# Patient Record
Sex: Female | Born: 1990 | Race: White | Hispanic: No | Marital: Married | Smoking: Never smoker
Health system: Southern US, Community
[De-identification: ages and names within clinical notes are randomized; demographics above are authoritative.]

## PROBLEM LIST (undated history)

## (undated) ENCOUNTER — Emergency Department (HOSPITAL_COMMUNITY): Discharge status: Absent without leave | Payer: Medicaid Other

## (undated) ENCOUNTER — Inpatient Hospital Stay (HOSPITAL_COMMUNITY): Payer: Self-pay

## (undated) DIAGNOSIS — E119 Type 2 diabetes mellitus without complications: Secondary | ICD-10-CM

## (undated) DIAGNOSIS — K5900 Constipation, unspecified: Secondary | ICD-10-CM

## (undated) DIAGNOSIS — F99 Mental disorder, not otherwise specified: Secondary | ICD-10-CM

## (undated) DIAGNOSIS — S73004A Unspecified dislocation of right hip, initial encounter: Secondary | ICD-10-CM

## (undated) DIAGNOSIS — I10 Essential (primary) hypertension: Secondary | ICD-10-CM

## (undated) DIAGNOSIS — Z9889 Other specified postprocedural states: Secondary | ICD-10-CM

## (undated) HISTORY — DX: Constipation, unspecified: K59.00

## (undated) HISTORY — DX: Morbid (severe) obesity due to excess calories: E66.01

## (undated) HISTORY — DX: Other specified postprocedural states: Z98.890

## (undated) HISTORY — DX: Mental disorder, not otherwise specified: F99

## (undated) HISTORY — PX: TUBAL LIGATION: SHX77

---
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
## 2011-08-09 NOTE — L&D Delivery Note (Addendum)
Delivery Note At 5:57 AM a viable female was delivered via Vaginal, Spontaneous Delivery (Presentation: ROA ).  APGAR: 8, 9; weight 7 lb 14.1 oz (3575 g).   Placenta status: Intact, Spontaneous, 2 large retroplacental clots.  Cord: 3 vessels with the following complications: None.   IV came out while pushing.  Received 10units pitocin IM after placenta, then had steady trickle of vaginal bleeding- received cytotec pr with good resolution of hemorrhage and fundus firm and u/1.  Anesthesia: Local  Episiotomy: None Lacerations: Labial;2nd degree;Perineal Suture Repair: 3.0 monocryl for vaginal/perineal, and 4.0 monocryl for Lt labial Est. Blood Loss (mL): 500  Dr. Jolayne Panther in to assist w/ repair  Mom to postpartum.  Baby to nursery for evaluation of possible respiratory distress, sats were good post-birth, but was grunting some.  Wants to bottlefeed, desires COCs- reinforced importance of taking daily, states sister-in-law will help her remember  Very hard IV stick, anesthesia had to start 20g in Lt anterior wrist on admit- will leave nsl out for now.  If requires fluids, additional iv meds call anesthesia to restart.  [name] 04/15/2012, 7:23 AM

## 2011-12-22 LAB — OB RESULTS CONSOLE GC/CHLAMYDIA
Chlamydia: NEGATIVE
Gonorrhea: NEGATIVE

## 2011-12-22 LAB — OB RESULTS CONSOLE ANTIBODY SCREEN: Antibody Screen: NEGATIVE

## 2011-12-22 LAB — OB RESULTS CONSOLE RPR: RPR: NONREACTIVE

## 2011-12-22 LAB — OB RESULTS CONSOLE ABO/RH: RH Type: POSITIVE

## 2011-12-22 LAB — OB RESULTS CONSOLE HIV ANTIBODY (ROUTINE TESTING): HIV: NONREACTIVE

## 2011-12-22 LAB — OB RESULTS CONSOLE RUBELLA ANTIBODY, IGM: Rubella: UNDETERMINED

## 2011-12-22 LAB — OB RESULTS CONSOLE HEPATITIS B SURFACE ANTIGEN: Hepatitis B Surface Ag: NEGATIVE

## 2012-03-25 LAB — OB RESULTS CONSOLE GBS: GBS: POSITIVE

## 2012-04-13 ENCOUNTER — Encounter (HOSPITAL_COMMUNITY): Payer: Self-pay | Admitting: *Deleted

## 2012-04-13 ENCOUNTER — Telehealth (HOSPITAL_COMMUNITY): Payer: Self-pay | Admitting: *Deleted

## 2012-04-13 NOTE — Telephone Encounter (Signed)
Preadmission screen  

## 2012-04-14 ENCOUNTER — Inpatient Hospital Stay (HOSPITAL_COMMUNITY)
Admission: RE | Admit: 2012-04-14 | Discharge: 2012-04-17 | DRG: 775 | Disposition: A | Discharge status: Home / Self Care | Payer: Medicaid Other | Source: Ambulatory Visit | Attending: Obstetrics and Gynecology | Admitting: Obstetrics and Gynecology

## 2012-04-14 DIAGNOSIS — O99892 Other specified diseases and conditions complicating childbirth: Secondary | ICD-10-CM | POA: Diagnosis present

## 2012-04-14 DIAGNOSIS — O480 Post-term pregnancy: Principal | ICD-10-CM | POA: Diagnosis present

## 2012-04-14 DIAGNOSIS — E669 Obesity, unspecified: Secondary | ICD-10-CM | POA: Diagnosis present

## 2012-04-14 DIAGNOSIS — Z22330 Carrier of Group B streptococcus: Secondary | ICD-10-CM

## 2012-04-14 LAB — TYPE AND SCREEN
ABO/RH(D): O POS
Antibody Screen: NEGATIVE

## 2012-04-14 LAB — CBC
HCT: 36.9 % (ref 36.0–46.0)
Hemoglobin: 12.1 g/dL (ref 12.0–15.0)
MCH: 27.4 pg (ref 26.0–34.0)
MCHC: 32.8 g/dL (ref 30.0–36.0)
MCV: 83.7 fL (ref 78.0–100.0)
Platelets: 287 10*3/uL (ref 150–400)
RBC: 4.41 MIL/uL (ref 3.87–5.11)
RDW: 15.8 % — ABNORMAL HIGH (ref 11.5–15.5)
WBC: 11.2 10*3/uL — ABNORMAL HIGH (ref 4.0–10.5)

## 2012-04-14 LAB — ABO/RH: ABO/RH(D): O POS

## 2012-04-14 LAB — RPR: RPR Ser Ql: NONREACTIVE

## 2012-04-14 MED ORDER — NALBUPHINE SYRINGE 5 MG/0.5 ML
5.0000 mg | INJECTION | INTRAMUSCULAR | Status: DC | PRN
  Administered 2012-04-14 – 2012-04-15 (×3): 5 mg via INTRAVENOUS
  Filled 2012-04-14 (×3): qty 0.5

## 2012-04-14 MED ORDER — CITRIC ACID-SODIUM CITRATE 334-500 MG/5ML PO SOLN: 30.0000 mL | ORAL | Status: DC | PRN

## 2012-04-14 MED ORDER — OXYTOCIN BOLUS FROM INFUSION
500.0000 mL | Freq: Once | INTRAVENOUS | Status: DC
  Filled 2012-04-14: qty 500

## 2012-04-14 MED ORDER — LACTATED RINGERS IV SOLN
INTRAVENOUS | Status: DC
  Administered 2012-04-14 (×2): via INTRAVENOUS

## 2012-04-14 MED ORDER — HYDROXYZINE HCL 50 MG/ML IM SOLN: 50.0000 mg | Freq: Four times a day (QID) | INTRAMUSCULAR | Status: DC | PRN

## 2012-04-14 MED ORDER — PENICILLIN G POTASSIUM 5000000 UNITS IJ SOLR
2.5000 10*6.[IU] | INTRAVENOUS | Status: DC
  Administered 2012-04-14 – 2012-04-15 (×4): 2.5 10*6.[IU] via INTRAVENOUS
  Filled 2012-04-14 (×7): qty 2.5

## 2012-04-14 MED ORDER — LIDOCAINE HCL (PF) 1 % IJ SOLN
30.0000 mL | INTRAMUSCULAR | Status: DC | PRN
  Administered 2012-04-15: 30 mL via SUBCUTANEOUS
  Filled 2012-04-14: qty 30

## 2012-04-14 MED ORDER — OXYTOCIN 40 UNITS IN LACTATED RINGERS INFUSION - SIMPLE MED
1.0000 m[IU]/min | INTRAVENOUS | Status: DC
  Administered 2012-04-14: 2 m[IU]/min via INTRAVENOUS
  Filled 2012-04-14: qty 1000

## 2012-04-14 MED ORDER — OXYTOCIN 40 UNITS IN LACTATED RINGERS INFUSION - SIMPLE MED: 62.5000 mL/h | Freq: Once | INTRAVENOUS | Status: DC

## 2012-04-14 MED ORDER — HYDROXYZINE HCL 50 MG PO TABS: 50.0000 mg | ORAL_TABLET | Freq: Four times a day (QID) | ORAL | Status: DC | PRN

## 2012-04-14 MED ORDER — PENICILLIN G POTASSIUM 5000000 UNITS IJ SOLR
5.0000 10*6.[IU] | Freq: Once | INTRAVENOUS | Status: AC
  Administered 2012-04-14: 5 10*6.[IU] via INTRAVENOUS
  Filled 2012-04-14: qty 5

## 2012-04-14 MED ORDER — ACETAMINOPHEN 325 MG PO TABS: 650.0000 mg | ORAL_TABLET | ORAL | Status: DC | PRN

## 2012-04-14 MED ORDER — ONDANSETRON HCL 4 MG/2ML IJ SOLN
4.0000 mg | Freq: Four times a day (QID) | INTRAMUSCULAR | Status: DC | PRN
  Administered 2012-04-15: 4 mg via INTRAVENOUS
  Filled 2012-04-14: qty 2

## 2012-04-14 MED ORDER — TERBUTALINE SULFATE 1 MG/ML IJ SOLN: 0.2500 mg | Freq: Once | INTRAMUSCULAR | Status: AC | PRN

## 2012-04-14 MED ORDER — LACTATED RINGERS IV SOLN
500.0000 mL | INTRAVENOUS | Status: DC | PRN
  Administered 2012-04-15: 500 mL via INTRAVENOUS

## 2012-04-14 MED ORDER — OXYCODONE-ACETAMINOPHEN 5-325 MG PO TABS: 1.0000 | ORAL_TABLET | ORAL | Status: DC | PRN

## 2012-04-14 MED ORDER — IBUPROFEN 600 MG PO TABS: 600.0000 mg | ORAL_TABLET | Freq: Four times a day (QID) | ORAL | Status: DC | PRN

## 2012-04-14 MED ORDER — FLEET ENEMA 7-19 GM/118ML RE ENEM: 1.0000 | ENEMA | RECTAL | Status: DC | PRN

## 2012-04-14 NOTE — Progress Notes (Signed)
Holly Pope is a 21 y.o. G1P0 at [redacted]w[redacted]d admitted for induction of labor due to Post dates. Due date 8/30.  Subjective: Doing well, feeling mild cramping occasionally. +fm.  Objective: BP 119/91  Pulse 133  Temp 98 F (36.7 C) (Oral)  Resp 18  Ht 5\' 2"  (1.575 m)  Wt 137.893 kg (304 lb)  BMI 55.60 kg/m2  LMP 07/01/2011      FHT:  FHR: 135 bpm, variability: moderate,  accelerations:  Present,  decelerations:  Absent RN applying beacon monitor UC:   Occasional, mild SVE:   Dilation: 1 Effacement (%): 50 Station: -2 Exam by:: kbooker, cnm  Cervical foley bulb placed and inflated w/ 60ml water w/o difficulty  Labs: Lab Results  Component Value Date   WBC 11.2* 04/14/2012   HGB 12.1 04/14/2012   HCT 36.9 04/14/2012   MCV 83.7 04/14/2012   PLT 287 04/14/2012    Assessment / Plan: IOL d/t postdates  Labor: Cervical foley bulb placed, will begin low dose pitocin not to exceed 12mu/min while foley bulb in Preeclampsia:  n/a Fetal Wellbeing:  Category I Pain Control:  n/a I/D:  n/a Anticipated MOD:  NSVD  [name] 04/14/2012, 10:46 AM

## 2012-04-14 NOTE — Progress Notes (Signed)
Requested CRNA to place IV.

## 2012-04-14 NOTE — Progress Notes (Signed)
Holly Pope is a 21 y.o. G1P0 at [redacted]w[redacted]d admitted for induction of labor due to Post dates.   Subjective: Comfortable, only feels mild uc's  Objective: BP 106/61  Pulse 84  Temp 98 F (36.7 C) (Oral)  Resp 18  Ht 5\' 2"  (1.575 m)  Wt 137.893 kg (304 lb)  BMI 55.60 kg/m2  LMP 07/01/2011      FHT:  FHR: 135 bpm, variability: moderate,  accelerations:  Present,  decelerations:  Absent UC:   regular, every 2-3 minutes SVE:   Dilation: 4 Effacement (%): 70 Station: -2 Exam by:: Ferne Coe RN  Foley bulb fell out and srom clear fluid @ 1840  Labs: Lab Results  Component Value Date   WBC 11.2* 04/14/2012   HGB 12.1 04/14/2012   HCT 36.9 04/14/2012   MCV 83.7 04/14/2012   PLT 287 04/14/2012    Assessment / Plan: IOL d/t postdates, cervical foley bulb fell out w/ srom clear fluid @ 1840, on 29mu/min pitocin  Labor: not yet Preeclampsia:  n/a Fetal Wellbeing:  Category I Pain Control:  n/a I/D:  will begin pcn w/ active labor Anticipated MOD:  NSVD  [name] 04/14/2012, 7:13 PM

## 2012-04-14 NOTE — Progress Notes (Signed)
Foley bulb placed in cervix with 60cc water in balloon.

## 2012-04-14 NOTE — Progress Notes (Signed)
Holly Pope is a 21 y.o. G1P0 at 108w1d admitted for induction of labor due to Post dates.  Subjective: Beginning to feel uc's slightly  Objective: BP 115/63  Pulse 90  Temp 98 F (36.7 C) (Oral)  Resp 18  Ht 5\' 2"  (1.575 m)  Wt 137.893 kg (304 lb)  BMI 55.60 kg/m2  LMP 07/01/2011      FHT:  FHR: 130 bpm, variability: moderate,  accelerations:  Present,  decelerations:  Absent UC:   regular, every 2-3 minutes SVE:   Dilation: 1 Effacement (%): 50 Station: -2 Exam by:: [employer] CNM  Labs: Lab Results  Component Value Date   WBC 11.2* 04/14/2012   HGB 12.1 04/14/2012   HCT 36.9 04/14/2012   MCV 83.7 04/14/2012   PLT 287 04/14/2012    Assessment / Plan: IOL d/t postdates, cervical foley bulb in place, on max dose of 79mu/min pitocin  Labor: not yet Preeclampsia:  n/a Fetal Wellbeing:  Category I Pain Control:  n/a I/D:  will begin pcn when active labor Anticipated MOD:  NSVD  [name] 04/14/2012, 1:42 PM

## 2012-04-14 NOTE — Progress Notes (Addendum)
Holly Pope is a 21 y.o. G1P0 at [redacted]w[redacted]d admitted for induction of labor due to Post dates.  Subjective: Comfortable, feeling mild uc's  Objective: BP 115/59  Pulse 91  Temp 98 F (36.7 C) (Oral)  Resp 18  Ht 5\' 2"  (1.575 m)  Wt 137.893 kg (304 lb)  BMI 55.60 kg/m2  LMP 07/01/2011      FHT:  FHR: 130 bpm, variability: moderate,  accelerations:  Present,  decelerations:  Absent UC:   irregular, every 2-5 minutes SVE:   Dilation: 1 Effacement (%): 50 Station: -2 Exam by:: Holly Pope CNM  Labs: Lab Results  Component Value Date   WBC 11.2* 04/14/2012   HGB 12.1 04/14/2012   HCT 36.9 04/14/2012   MCV 83.7 04/14/2012   PLT 287 04/14/2012    Assessment / Plan: IOL d/t postdates, cervical foley bulb in place, on 65mu/min pitocin  Labor: not yet Preeclampsia:  n/a Fetal Wellbeing:  Category I Pain Control:  n/a I/D:  pcn when in active labor Anticipated MOD:  NSVD  Holly Pope 04/14/2012, 4:53 PM

## 2012-04-14 NOTE — Progress Notes (Signed)
Holly Pope is a 21 y.o. G1P0 at [redacted]w[redacted]d admitted for induction of labor due to Post dates.   Subjective: Some relief w/ nubain. Family supportive at bs.  Objective: BP 117/76  Pulse 82  Temp 98 F (36.7 C) (Oral)  Resp 22  Ht 5\' 2"  (1.575 m)  Wt 137.893 kg (304 lb)  BMI 55.60 kg/m2  LMP 07/01/2011      FHT:  FHR: 120 bpm, variability: moderate,  accelerations:  Present,  decelerations:  Absent UC:   regular, every 1-4 minutes SVE:   Dilation: 4 Effacement (%): 70 Station: -2 Exam by:: Ferne Coe RN  Labs: Lab Results  Component Value Date   WBC 11.2* 04/14/2012   HGB 12.1 04/14/2012   HCT 36.9 04/14/2012   MCV 83.7 04/14/2012   PLT 287 04/14/2012    Assessment / Plan: IOL d/t postdates, now on pitocin @ 29mu/min  Labor: not yet Preeclampsia:  n/a Fetal Wellbeing:  Category I Pain Control:  nubain I/D:  penicillin per protocol for gbs+ Anticipated MOD:  NSVD  [name] 04/14/2012, 9:57 PM

## 2012-04-14 NOTE — H&P (Signed)
Holly Pope is a 21 y.o. G1P0 female at [redacted]w[redacted]d  presenting for IOL for postdates.  Denies uc's, VB, or LOF.  Reports +fm.  Denies HA, scotoma, ruq/epigastric pain, n/v.  Was checked in office last week per pt and was 1cm.  Late onset of care @ 24.6wks at [facility].  Rubella equivocal.  GBS pos.   Maternal Medical History:  Fetal activity: Perceived fetal activity is normal.   Last perceived fetal movement was within the past hour.    Prenatal complications: Late onset of care @ 24.6  Prenatal Complications - Diabetes: none.    OB History    Grav Para Term Preterm Abortions TAB SAB Ect Mult Living   1              Past Medical History  Diagnosis Date  . H/O removal of neck cyst   . Morbidly obese    Past Surgical History  Procedure Date  . Cystectomy 2002    rmoval from neck   Family History: family history includes Heart disease in her father and mother and Hypertension in her brother, father, and mother. Social History:  does not have a smoking history on file. She has never used smokeless tobacco. She reports that she does not drink alcohol or use illicit drugs.   Prenatal Transfer Tool  Maternal Diabetes: No Genetic Screening: Declined Maternal Ultrasounds/Referrals: normal Fetal Ultrasounds or other Referrals:  None Maternal Substance Abuse:  No Significant Maternal Medications:  None Significant Maternal Lab Results:  Lab values include: Group B Strep positive Other Comments:  None  Review of Systems  Constitutional: Negative.   HENT: Negative.   Eyes: Negative.   Respiratory: Negative.   Cardiovascular: Negative.   Gastrointestinal: Negative.   Genitourinary: Negative.   Musculoskeletal: Negative.   Skin: Negative.   Neurological: Negative.   Endo/Heme/Allergies: Negative.   Psychiatric/Behavioral: Negative.     Dilation: 1 Effacement (%): 50 Station: -2 Exam by:: kbooker, cnm Blood pressure 119/91, pulse 133, temperature 98 F (36.7 C), temperature  source Oral, resp. rate 18, height 5\' 2"  (1.575 m), weight 137.893 kg (304 lb), last menstrual period 07/01/2011. Maternal Exam:  Uterine Assessment: Contraction frequency is rare.   Abdomen: Patient reports no abdominal tenderness. Fetal presentation: vertex  Introitus: Normal vulva. Normal vagina.  Pelvis: adequate for delivery.   Cervix: Cervix evaluated by digital exam.     Physical Exam  Constitutional: She is oriented to person, place, and time. She appears well-developed and well-nourished.  HENT:  Head: Normocephalic.  Cardiovascular: Normal rate and regular rhythm.   Respiratory: Effort normal and breath sounds normal.  GI: Soft.  Genitourinary: Vagina normal and uterus normal.  Musculoskeletal: Normal range of motion. She exhibits no edema.  Neurological: She is alert and oriented to person, place, and time. She has normal reflexes.  Skin: Skin is warm and dry.  Psychiatric: She has a normal mood and affect. Her behavior is normal. Judgment and thought content normal.    FHR: 125, mod, 15x15, no decels= Cat I UCs: occasional, mild  Prenatal labs: ABO, Rh: O/Positive/-- (05/16 0000) Antibody: Negative (05/16 0000) Rubella: Equivocal (05/16 0000) RPR: Nonreactive (05/16 0000)  HBsAg: Negative (05/16 0000)  HIV: Non-reactive (05/16 0000)  GBS: Positive (08/18 0000)   Assessment/Plan: A:  [redacted]w[redacted]d SIUP  IOL for postdates  Cat I FHR  GBS pos  Rubella equivocal  Morbid obesity  Late onset of care at 24.6  P:   Admit to BS  Cervical foley  bulb with low-dose pitocin not to exceed 2mu/min while foley bulb in  PCN per protocol when in active labor for gbs+  Anticipate NSVD  [name] 04/14/2012, 9:34 AM

## 2012-04-15 ENCOUNTER — Encounter (HOSPITAL_COMMUNITY): Payer: Self-pay

## 2012-04-15 DIAGNOSIS — E669 Obesity, unspecified: Secondary | ICD-10-CM

## 2012-04-15 DIAGNOSIS — O480 Post-term pregnancy: Secondary | ICD-10-CM

## 2012-04-15 DIAGNOSIS — O99214 Obesity complicating childbirth: Secondary | ICD-10-CM

## 2012-04-15 DIAGNOSIS — O9989 Other specified diseases and conditions complicating pregnancy, childbirth and the puerperium: Secondary | ICD-10-CM

## 2012-04-15 DIAGNOSIS — O99892 Other specified diseases and conditions complicating childbirth: Secondary | ICD-10-CM

## 2012-04-15 MED ORDER — MEASLES, MUMPS & RUBELLA VAC ~~LOC~~ INJ
0.5000 mL | INJECTION | Freq: Once | SUBCUTANEOUS | Status: AC
Start: 2012-04-16 — End: 2012-04-16
  Administered 2012-04-16: 0.5 mL via SUBCUTANEOUS
  Filled 2012-04-15 (×2): qty 0.5

## 2012-04-15 MED ORDER — ONDANSETRON HCL 4 MG/2ML IJ SOLN: 4.0000 mg | INTRAMUSCULAR | Status: DC | PRN

## 2012-04-15 MED ORDER — TETANUS-DIPHTH-ACELL PERTUSSIS 5-2.5-18.5 LF-MCG/0.5 IM SUSP
0.5000 mL | Freq: Once | INTRAMUSCULAR | Status: AC
  Administered 2012-04-16: 0.5 mL via INTRAMUSCULAR
  Filled 2012-04-15: qty 0.5

## 2012-04-15 MED ORDER — WITCH HAZEL-GLYCERIN EX PADS: 1.0000 "application " | MEDICATED_PAD | CUTANEOUS | Status: DC | PRN

## 2012-04-15 MED ORDER — IBUPROFEN 600 MG PO TABS
600.0000 mg | ORAL_TABLET | Freq: Four times a day (QID) | ORAL | Status: DC
  Administered 2012-04-15 – 2012-04-17 (×8): 600 mg via ORAL
  Filled 2012-04-15 (×8): qty 1

## 2012-04-15 MED ORDER — OXYCODONE-ACETAMINOPHEN 5-325 MG PO TABS
1.0000 | ORAL_TABLET | ORAL | Status: DC | PRN
  Administered 2012-04-15: 1 via ORAL
  Filled 2012-04-15: qty 1

## 2012-04-15 MED ORDER — DIPHENHYDRAMINE HCL 25 MG PO CAPS: 25.0000 mg | ORAL_CAPSULE | Freq: Four times a day (QID) | ORAL | Status: DC | PRN

## 2012-04-15 MED ORDER — OXYTOCIN 40 UNITS IN LACTATED RINGERS INFUSION - SIMPLE MED: 62.5000 mL/h | INTRAVENOUS | Status: DC | PRN

## 2012-04-15 MED ORDER — PRENATAL MULTIVITAMIN CH
1.0000 | ORAL_TABLET | Freq: Every day | ORAL | Status: DC
  Administered 2012-04-15 – 2012-04-16 (×2): 1 via ORAL
  Filled 2012-04-15 (×2): qty 1

## 2012-04-15 MED ORDER — SENNOSIDES-DOCUSATE SODIUM 8.6-50 MG PO TABS
2.0000 | ORAL_TABLET | Freq: Every day | ORAL | Status: DC
  Administered 2012-04-15 – 2012-04-16 (×2): 2 via ORAL

## 2012-04-15 MED ORDER — ZOLPIDEM TARTRATE 5 MG PO TABS: 5.0000 mg | ORAL_TABLET | Freq: Every evening | ORAL | Status: DC | PRN

## 2012-04-15 MED ORDER — FLEET ENEMA 7-19 GM/118ML RE ENEM: 1.0000 | ENEMA | Freq: Every day | RECTAL | Status: DC | PRN

## 2012-04-15 MED ORDER — SIMETHICONE 80 MG PO CHEW: 80.0000 mg | CHEWABLE_TABLET | ORAL | Status: DC | PRN

## 2012-04-15 MED ORDER — BISACODYL 10 MG RE SUPP: 10.0000 mg | Freq: Every day | RECTAL | Status: DC | PRN

## 2012-04-15 MED ORDER — DIBUCAINE 1 % RE OINT: 1.0000 "application " | TOPICAL_OINTMENT | RECTAL | Status: DC | PRN

## 2012-04-15 MED ORDER — OXYTOCIN 10 UNIT/ML IJ SOLN
INTRAMUSCULAR | Status: AC
  Administered 2012-04-15: 10 [IU]
  Filled 2012-04-15: qty 2

## 2012-04-15 MED ORDER — MISOPROSTOL 200 MCG PO TABS
ORAL_TABLET | ORAL | Status: AC
  Administered 2012-04-15: 800 ug via RECTAL
  Filled 2012-04-15: qty 4

## 2012-04-15 MED ORDER — BENZOCAINE-MENTHOL 20-0.5 % EX AERO
1.0000 "application " | INHALATION_SPRAY | CUTANEOUS | Status: DC | PRN
  Administered 2012-04-15: 1 via TOPICAL
  Filled 2012-04-15: qty 56

## 2012-04-15 MED ORDER — LANOLIN HYDROUS EX OINT: TOPICAL_OINTMENT | CUTANEOUS | Status: DC | PRN

## 2012-04-15 MED ORDER — ONDANSETRON HCL 4 MG PO TABS: 4.0000 mg | ORAL_TABLET | ORAL | Status: DC | PRN

## 2012-04-15 NOTE — Progress Notes (Signed)
Holly Pope is a 21 y.o. G1P0 at [redacted]w[redacted]d admitted for induction of labor due to Post dates.   Subjective: Uncomfortable w/ uc's, mainly in low back, requesting pain med  Objective: BP 117/76  Pulse 82  Temp 98 F (36.7 C) (Oral)  Resp 22  Ht 5\' 2"  (1.575 m)  Wt 137.893 kg (304 lb)  BMI 55.60 kg/m2  LMP 07/01/2011      FHT:  FHR: 120 bpm, variability: moderate,  accelerations:  Present,  decelerations:  Absent UC:   irregular, every 1-4 minutes SVE:   Dilation: 5 Effacement (%): 80 Station: -1 Exam by:: Genella Rife, CNM  Labs: Lab Results  Component Value Date   WBC 11.2* 04/14/2012   HGB 12.1 04/14/2012   HCT 36.9 04/14/2012   MCV 83.7 04/14/2012   PLT 287 04/14/2012    Assessment / Plan: IOL d/t postdates, on pitocin, now at 65mu/min  Labor: progressing slowly, continue to increase pitocin to achieve adequate uc pattern Preeclampsia:  n/a Fetal Wellbeing:  Category I Pain Control:  nubain I/D:  pcn for gbs+ Anticipated MOD:  NSVD  Holly Pope 04/15/2012, 12:03 AM

## 2012-04-15 NOTE — Progress Notes (Signed)
TANIA STEINHAUSER is a 21 y.o. G1P0 at [redacted]w[redacted]d admitted for induction of labor due to Post dates.   Subjective: Uncomfortable w/ uc's, breathing well, pain has moved from mainly lower back to lower abdomen. Family supportive at bs.  Objective: BP 106/44  Pulse 97  Temp 98.1 F (36.7 C) (Oral)  Resp 20  Ht 5\' 2"  (1.575 m)  Wt 137.893 kg (304 lb)  BMI 55.60 kg/m2  LMP 07/01/2011      FHT:  FHR: 135 bpm, variability: moderate,  accelerations:  Present,  decelerations:  Absent UC:   irregular, every 1-5 minutes, difficult to adequately assess uc's w/ beacon monitor SVE:   5/90/-1, well applied to cervix  IUPC placed w/o difficulty to better assess uc's  Labs: Lab Results  Component Value Date   WBC 11.2* 04/14/2012   HGB 12.1 04/14/2012   HCT 36.9 04/14/2012   MCV 83.7 04/14/2012   PLT 287 04/14/2012    Assessment / Plan: IOL d/t postdates, on pitocin 38mu/min, now has IUPC  Labor: protracted active phase, increase pitocin per protocol to acheive adequate mvu's Preeclampsia:  n/a Fetal Wellbeing:  Category I Pain Control:  has received multiple doses of nubain, doesn't want epidural at this time I/D:  pcn for gbs + Anticipated MOD:  NSVD  [name] 04/15/2012, 3:31 AM

## 2012-04-15 NOTE — Progress Notes (Signed)
At approx 2230 pt's mother called and stated pt had had heavy bleeding and clots when up to bathroom.  Small amt of blood noted on floor and 2 clots noted on pad- one plum sized and one golfball sized.  Pt's mother reported that pt had only been up to void 3 times today.  Encouraged pt to empty bladder every 2-3 hours and to call if continued to pass clots or if heavy bleeding.  Once pt back in bed, fundus palpated at U/1, midline and firm with small amt lochia.

## 2012-04-16 LAB — CBC
HCT: 25.4 % — ABNORMAL LOW (ref 36.0–46.0)
Hemoglobin: 8.3 g/dL — ABNORMAL LOW (ref 12.0–15.0)
MCH: 27.9 pg (ref 26.0–34.0)
MCHC: 32.7 g/dL (ref 30.0–36.0)
MCV: 85.2 fL (ref 78.0–100.0)
Platelets: 247 10*3/uL (ref 150–400)
RBC: 2.98 MIL/uL — ABNORMAL LOW (ref 3.87–5.11)
RDW: 16.4 % — ABNORMAL HIGH (ref 11.5–15.5)
WBC: 11.8 10*3/uL — ABNORMAL HIGH (ref 4.0–10.5)

## 2012-04-16 NOTE — Clinical Social Work Psychosocial (Signed)
    Clinical Social Work Department BRIEF PSYCHOSOCIAL ASSESSMENT 04/16/2012  Patient:  Holly Pope, Holly Pope     Account Number:  [id number]     Admit date:  04/14/2012  Clinical Social Worker:  Andy Gauss  Date/Time:  04/16/2012 03:33 PM  Referred by:  Physician  Date Referred:  04/16/2012 Referred for  Behavioral Health Issues   Other Referral:   Flat affect   Interview type:  Family Other interview type:    PSYCHOSOCIAL DATA Living Status:  FAMILY Admitted from facility:   Level of care:   Primary support name:  Holly Pope Primary support relationship to patient:  FRIEND Degree of support available:   Holly Pope, FOB    Involved    CURRENT CONCERNS Current Concerns  Behavioral Health Issues   Other Concerns:    SOCIAL WORK ASSESSMENT / PLAN Sw met with 21 year old, G1P1 referred for assessment of social situation and flat affect, reported by nursing staff.  Pt is currently living with her brother, Holly Pope & sister in-law, Holly Pope.  She has lived with this couple since 2010.  She graduated from [employer].  She is unemployed.  Denies any mental health diagnoses.  During assessment, pt often looked at her sister in-law and FOB for answers or guidance, as if she couldn't answer independently. Holly Pope, who seems to be pt's primary support person, encouraged pt to answer this Sw questions.  Pt does admit to some nervousness about caring for the infant.  Sw encouraged her to ask RN questions or ask for additional instruction to gain better understanding/ comfort level.  Sw will refer to [employer], Healthy Moms, Healthy Babies program, with pt's consent.  She has all the necessary supplies for the infant.  Pt's affect is flat however Holly Pope reports her mood as baseline.  Sw is concerned that pt may have some cognitive limitations and therefore will benefit from additional, repeated instructions.  Sw will continue to assist further  as needed.   Assessment/plan status:  No Further Intervention Required Other assessment/ plan:   Information/referral to community resources:   [employer], Healthy Mom's, Healthy Babies    PATIENT'S/FAMILY'S RESPONSE TO PLAN OF CARE: Pt did smile towards the end of assessment and thanked Sw for resources offered.

## 2012-04-16 NOTE — Progress Notes (Signed)
Post Partum Day #1, NSVD, IOL for postdates Subjective: no complaints, up ad lib, voiding and tolerating PO  Objective: Blood pressure 103/68, pulse 102, temperature 97.6 F (36.4 C), temperature source Oral, resp. rate 20, height 5\' 2"  (1.575 m), weight 137.893 kg (304 lb), last menstrual period 07/01/2011, SpO2 97.00%, unknown if currently breastfeeding.  Physical Exam:  General: alert, cooperative and no distress Lochia: appropriate Uterine Fundus: firm Incision: n/a DVT Evaluation: No evidence of DVT seen on physical exam.   Basename 04/16/12 0534 04/14/12 0900  HGB 8.3* 12.1  HCT 25.4* 36.9    Assessment/Plan: Plan for discharge tomorrow, Social Work consult and Contraception OCPs -Some concern for mom's ability to care for baby at home; will f/u SW consult -Some large clots last night and PPH requiring Cytotec at time of delivery; Hgb dropped from 12 to 8.3 but patient asymptomatic; will monitor for s/s of anemia/orthostasis    LOS: 2 days   Prentis Langdon 04/16/2012, 7:17 AM

## 2012-04-16 NOTE — H&P (Signed)
Agree with above note.  Ellanore Vanhook 04/16/2012 4:14 PM   

## 2012-04-16 NOTE — Progress Notes (Signed)
I examined pt and agree with documentation above and resident plan of care. MUHAMMAD,WALIDAH  

## 2012-04-16 NOTE — Progress Notes (Signed)
UR chart review completed.  

## 2012-04-17 MED ORDER — FERROUS SULFATE 325 (65 FE) MG PO TABS: 325.0000 mg | ORAL_TABLET | Freq: Every day | ORAL | Status: DC

## 2012-04-17 MED ORDER — IBUPROFEN 600 MG PO TABS: 600.0000 mg | ORAL_TABLET | Freq: Four times a day (QID) | ORAL | Status: AC

## 2012-04-17 MED ORDER — SENNOSIDES-DOCUSATE SODIUM 8.6-50 MG PO TABS: 2.0000 | ORAL_TABLET | Freq: Every day | ORAL | Status: DC

## 2012-04-17 NOTE — Discharge Summary (Signed)
Obstetric Discharge Summary Reason for Admission: induction of labor for post dates Prenatal Procedures: ultrasound Intrapartum Procedures: spontaneous vaginal delivery Postpartum Procedures: none Complications-Operative and Postpartum: none Hemoglobin  Date Value Range Status  04/16/2012 8.3* 12.0 - 15.0 g/dL Final     DELTA CHECK NOTED     REPEATED TO VERIFY     HCT  Date Value Range Status  04/16/2012 25.4* 36.0 - 46.0 % Final    Physical Exam:  General: alert, cooperative and no distress Lochia: appropriate Uterine Fundus: firm Incision: n/a DVT Evaluation: No evidence of DVT seen on physical exam.  Discharge Diagnoses: Term Pregnancy-delivered and Post-date pregnancy  Discharge Information: Date: 04/17/2012 Activity: pelvic rest Diet: routine Medications: PNV, Ibuprofen, Colace and Iron; will need OCPs at follow up visit in 6 weeks (combined pills) Condition: stable Instructions: refer to practice specific booklet Discharge to: home   Newborn Data: Live born female  Birth Weight: 7 lb 14.1 oz (3575 g) APGAR: 8, 9  Home with mother.  MCGILL,JACQUELYN 04/17/2012, 7:13 AM  I have seen this patient and agree with the above resident's note.  Pt has scheduled postpartum f/u appointment with [facility].   LEFTWICH-KIRBY, Dynastee Brummell Certified Nurse-Midwife

## 2012-05-06 ENCOUNTER — Observation Stay (HOSPITAL_COMMUNITY)
Admission: EM | Admit: 2012-05-06 | Discharge: 2012-05-08 | Disposition: A | Discharge status: Home / Self Care | Payer: No Typology Code available for payment source | Attending: Orthopedic Surgery | Admitting: Orthopedic Surgery

## 2012-05-06 ENCOUNTER — Emergency Department (HOSPITAL_COMMUNITY): Payer: No Typology Code available for payment source

## 2012-05-06 ENCOUNTER — Encounter (HOSPITAL_COMMUNITY): Payer: Self-pay | Admitting: Emergency Medicine

## 2012-05-06 DIAGNOSIS — M25559 Pain in unspecified hip: Secondary | ICD-10-CM | POA: Insufficient documentation

## 2012-05-06 DIAGNOSIS — S73006A Unspecified dislocation of unspecified hip, initial encounter: Principal | ICD-10-CM | POA: Insufficient documentation

## 2012-05-06 DIAGNOSIS — S73004A Unspecified dislocation of right hip, initial encounter: Secondary | ICD-10-CM | POA: Diagnosis present

## 2012-05-06 HISTORY — DX: Unspecified dislocation of right hip, initial encounter: S73.004A

## 2012-05-06 LAB — CBC WITH DIFFERENTIAL/PLATELET
Basophils Absolute: 0 10*3/uL (ref 0.0–0.1)
Basophils Relative: 0 % (ref 0–1)
Eosinophils Absolute: 0 10*3/uL (ref 0.0–0.7)
Eosinophils Relative: 0 % (ref 0–5)
HCT: 31.2 % — ABNORMAL LOW (ref 36.0–46.0)
Hemoglobin: 10.1 g/dL — ABNORMAL LOW (ref 12.0–15.0)
Lymphocytes Relative: 4 % — ABNORMAL LOW (ref 12–46)
Lymphs Abs: 0.9 10*3/uL (ref 0.7–4.0)
MCH: 28 pg (ref 26.0–34.0)
MCHC: 32.4 g/dL (ref 30.0–36.0)
MCV: 86.4 fL (ref 78.0–100.0)
Monocytes Absolute: 0.8 10*3/uL (ref 0.1–1.0)
Monocytes Relative: 4 % (ref 3–12)
Neutro Abs: 19.3 10*3/uL — ABNORMAL HIGH (ref 1.7–7.7)
Neutrophils Relative %: 92 % — ABNORMAL HIGH (ref 43–77)
Platelets: 402 10*3/uL — ABNORMAL HIGH (ref 150–400)
RBC: 3.61 MIL/uL — ABNORMAL LOW (ref 3.87–5.11)
RDW: 14.7 % (ref 11.5–15.5)
WBC: 21 10*3/uL — ABNORMAL HIGH (ref 4.0–10.5)

## 2012-05-06 LAB — BASIC METABOLIC PANEL
BUN: 6 mg/dL (ref 6–23)
CO2: 26 mEq/L (ref 19–32)
Calcium: 9.5 mg/dL (ref 8.4–10.5)
Chloride: 101 mEq/L (ref 96–112)
Creatinine, Ser: 0.78 mg/dL (ref 0.50–1.10)
GFR calc Af Amer: >90 mL/min (ref >90)
GFR calc non Af Amer: >90 mL/min (ref >90)
Glucose, Bld: 125 mg/dL — ABNORMAL HIGH (ref 70–99)
Potassium: 3.9 mEq/L (ref 3.5–5.1)
Sodium: 140 mEq/L (ref 135–145)

## 2012-05-06 MED ORDER — PROPOFOL 10 MG/ML IV BOLUS
INTRAVENOUS | Status: AC | PRN
  Administered 2012-05-06: 100 mg via INTRAVENOUS

## 2012-05-06 MED ORDER — MIDAZOLAM HCL 5 MG/5ML IJ SOLN
INTRAMUSCULAR | Status: AC
  Filled 2012-05-06: qty 5

## 2012-05-06 MED ORDER — ONDANSETRON HCL 4 MG/2ML IJ SOLN: 4.0000 mg | Freq: Once | INTRAMUSCULAR | Status: DC

## 2012-05-06 MED ORDER — SODIUM CHLORIDE 0.9 % IV SOLN
Freq: Once | INTRAVENOUS | Status: AC
  Administered 2012-05-06: 21:00:00 via INTRAVENOUS

## 2012-05-06 MED ORDER — HYDROMORPHONE HCL PF 1 MG/ML IJ SOLN: 1.0000 mg | Freq: Once | INTRAMUSCULAR | Status: DC

## 2012-05-06 MED ORDER — PROPOFOL 10 MG/ML IV BOLUS
INTRAVENOUS | Status: AC
  Filled 2012-05-06: qty 1

## 2012-05-06 MED ORDER — MIDAZOLAM HCL 5 MG/5ML IJ SOLN
INTRAMUSCULAR | Status: AC | PRN
  Administered 2012-05-06: 5 mg via INTRAVENOUS
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
## 2012-05-06 NOTE — ED Notes (Signed)
Patient lying on stretcher, awake and talking with family members.  Patient states she only has "a little pain".
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
## 2012-05-07 ENCOUNTER — Encounter (HOSPITAL_COMMUNITY): Payer: Self-pay | Admitting: General Practice

## 2012-05-07 MED ORDER — ACETAMINOPHEN 325 MG PO TABS
650.0000 mg | ORAL_TABLET | Freq: Four times a day (QID) | ORAL | Status: DC | PRN
  Administered 2012-05-07: 650 mg via ORAL
  Filled 2012-05-07: qty 2

## 2012-05-07 MED ORDER — OXYCODONE HCL 5 MG PO TABS
5.0000 mg | ORAL_TABLET | ORAL | Status: DC | PRN
  Administered 2012-05-07: 5 mg via ORAL
  Filled 2012-05-07: qty 1

## 2012-05-07 MED ORDER — MENTHOL 3 MG MT LOZG: 1.0000 | LOZENGE | OROMUCOSAL | Status: DC | PRN

## 2012-05-07 MED ORDER — TEMAZEPAM 15 MG PO CAPS: 15.0000 mg | ORAL_CAPSULE | Freq: Every evening | ORAL | Status: DC | PRN

## 2012-05-07 MED ORDER — ACETAMINOPHEN 650 MG RE SUPP: 650.0000 mg | Freq: Four times a day (QID) | RECTAL | Status: DC | PRN

## 2012-05-07 MED ORDER — METHOCARBAMOL 500 MG PO TABS: 500.0000 mg | ORAL_TABLET | Freq: Four times a day (QID) | ORAL | Status: DC | PRN

## 2012-05-07 MED ORDER — METOCLOPRAMIDE HCL 10 MG PO TABS
5.0000 mg | ORAL_TABLET | Freq: Three times a day (TID) | ORAL | Status: DC | PRN
Start: 2012-05-07 — End: 2012-05-08

## 2012-05-07 MED ORDER — HYDROCODONE-ACETAMINOPHEN 5-325 MG PO TABS
1.0000 | ORAL_TABLET | Freq: Four times a day (QID) | ORAL | Status: DC | PRN
  Administered 2012-05-07 – 2012-05-08 (×5): 1 via ORAL
  Administered 2012-05-08: 2 via ORAL
  Filled 2012-05-07 (×5): qty 1
  Filled 2012-05-07: qty 2

## 2012-05-07 MED ORDER — ENOXAPARIN SODIUM 30 MG/0.3ML ~~LOC~~ SOLN
30.0000 mg | Freq: Two times a day (BID) | SUBCUTANEOUS | Status: DC
  Administered 2012-05-07 – 2012-05-08 (×3): 30 mg via SUBCUTANEOUS
  Filled 2012-05-07 (×3): qty 0.3

## 2012-05-07 MED ORDER — HYDROMORPHONE HCL PF 1 MG/ML IJ SOLN: 0.1000 mg | INTRAMUSCULAR | Status: DC | PRN

## 2012-05-07 MED ORDER — METOCLOPRAMIDE HCL 5 MG/ML IJ SOLN: 5.0000 mg | Freq: Three times a day (TID) | INTRAMUSCULAR | Status: DC | PRN

## 2012-05-07 MED ORDER — FERROUS SULFATE 325 (65 FE) MG PO TABS
325.0000 mg | ORAL_TABLET | Freq: Every day | ORAL | Status: DC
  Administered 2012-05-07 – 2012-05-08 (×2): 325 mg via ORAL
  Filled 2012-05-07 (×2): qty 1

## 2012-05-07 MED ORDER — SODIUM CHLORIDE 0.9 % IJ SOLN
INTRAMUSCULAR | Status: AC
  Administered 2012-05-07: 19:00:00
  Filled 2012-05-07: qty 3

## 2012-05-07 MED ORDER — ONDANSETRON HCL 4 MG/2ML IJ SOLN: 4.0000 mg | Freq: Four times a day (QID) | INTRAMUSCULAR | Status: DC | PRN

## 2012-05-07 MED ORDER — PHENOL 1.4 % MT LIQD: 1.0000 | OROMUCOSAL | Status: DC | PRN

## 2012-05-07 MED ORDER — METHOCARBAMOL 100 MG/ML IJ SOLN
500.0000 mg | Freq: Four times a day (QID) | INTRAVENOUS | Status: DC | PRN
  Administered 2012-05-07: 500 mg via INTRAVENOUS
  Filled 2012-05-07: qty 5

## 2012-05-07 MED ORDER — ONDANSETRON HCL 4 MG PO TABS: 4.0000 mg | ORAL_TABLET | Freq: Four times a day (QID) | ORAL | Status: DC | PRN

## 2012-05-07 MED ORDER — SODIUM CHLORIDE 0.9 % IV SOLN
INTRAVENOUS | Status: DC
  Administered 2012-05-07 (×2): via INTRAVENOUS

## 2012-05-07 MED ORDER — ALUM & MAG HYDROXIDE-SIMETH 200-200-20 MG/5ML PO SUSP: 30.0000 mL | ORAL | Status: DC | PRN

## 2012-05-07 NOTE — Progress Notes (Signed)
This morning, patient had a temperature of 100.9. Tylenol was given to patient.
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
## 2012-05-07 NOTE — Progress Notes (Signed)
Patient came up to the floor. She stated that she had been drinking clear liquids. Patient states she is not having any nausea or vomiting. RN ordered a regular diet for the patient.

## 2012-05-07 NOTE — Progress Notes (Signed)
Subjective: C/o soreness  Denies paresthesias    Objective: Vital signs in last 24 hours: Temp:  [98.2 F (36.8 C)-100.9 F (38.3 C)] 100.9 F (38.3 C) (09/30 0606) Pulse Rate:  [112-144] 113  (09/30 0731) Resp:  [18-20] 20  (09/30 0606) BP: (92-129)/(34-63) 123/51 mmHg (09/30 0606) SpO2:  [96 %-100 %] 97 % (09/30 0731) Weight:  [289 lb 3.2 oz (131.18 kg)-300 lb (136.079 kg)] 289 lb 3.2 oz (131.18 kg) (09/30 0047)  Intake/Output from previous day: 09/29 0701 - 09/30 0700 In: 197.5 [I.V.:197.5] Out: -  Intake/Output this shift:     Basename 05/06/12 2016  HGB 10.1*    Basename 05/06/12 2016  WBC 21.0*  RBC 3.61*  HCT 31.2*  PLT 402*    Basename 05/06/12 2016  NA 140  K 3.9  CL 101  CO2 26  BUN 6  CREATININE 0.78  GLUCOSE 125*  CALCIUM 9.5   No results found for this basename: LABPT:2,INR:2 in the last 72 hours  Neurologically intact Neurovascular intact Dorsiflexion/Plantar flexion intact  Assessment/Plan: PT  Discharge when mobile and stable on walker  Post reduction Ct shows fracture with small fragments near fovea , should be ok without surgery will consult with traumatologist    [name] 05/07/2012, 8:46 AM

## 2012-05-07 NOTE — Progress Notes (Signed)
Physical Therapy Treatment Patient Details Name: Holly Pope MRN: [id number] DOB: 12-03-1990 Today's Date: 05/07/2012 Time: 4098-1191 PT Time Calculation (min): 26 min  PT Assessment / Plan / Recommendation Comments on Treatment Session  Pt appears to be less anxious this afternoon and has no hip pain.  She cont. to need mod assist to transfer in/out of bed, but no pain reported with transfer.  We spent a lot of time educating her as to new weight bearing status and I had her use my bare foot as a feedback method (scale was not available).  She appeared to understand teaching, but during gait it looked as if she was bearing more than 30# on RLE, although she did not have any significant pain.  She will need to minimize the amount of walking she does for a while and avoid fatiguing so as not to overdo weight bearing.                  Follow Up Recommendations       Barriers to Discharge        Equipment Recommendations       Recommendations for Other Services    Frequency     Plan Discharge plan remains appropriate;Frequency remains appropriate    Precautions / Restrictions     Pertinent Vitals/Pain     Mobility  Bed Mobility Supine to Sit: 3: Mod assist;HOB elevated Sit to Supine: 3: Mod assist;HOB elevated Transfers Sit to Stand: 4: Min assist;From bed;With upper extremity assist Stand to Sit: With upper extremity assist;5: Supervision;To bed Details for Transfer Assistance: Pt declined sitting  in a recliner Ambulation/Gait Ambulation/Gait Assistance: 4: Min guard Ambulation Distance (Feet): 20 Feet Assistive device: Rolling walker Ambulation/Gait Assistance Details: Pt is now PWB (no more than 30#) on RLE.Marland KitchenMarland KitchenI attempted to locate a scale in order to demonstrate what 30# feels like,, but our scale was digital and could not give [place] the info needed...so I used my bare foot and gave her verbal feedback as to proper weight bearing on top of my foot Gait Pattern: Within  Functional Limits Gait velocity: gait is less labored than this morning General Gait Details: It concerns me that pt may be placing a bit more than 30# of weight on RLE at times.  She does not have any significant pain with PWB, but it will probably be best to not encourage a lot of walking for a while Stairs: No Wheelchair Mobility Wheelchair Mobility: No    Exercises     PT Diagnosis:    PT Problem List:   PT Treatment Interventions:     PT Goals Acute Rehab PT Goals PT Goal: Supine/Side to Sit - Progress: Progressing toward goal PT Goal: Sit to Supine/Side - Progress: Progressing toward goal PT Goal: Sit to Stand - Progress: Progressing toward goal Pt will go Stand to Sit: with modified independence PT Goal: Stand to Sit - Progress: Updated due to goals met PT Goal: Ambulate - Progress: Progressing toward goal  Visit Information  Last PT Received On: 05/07/12    Subjective Data  Subjective: no c/o   Cognition       Balance     End of Session PT - End of Session Equipment Utilized During Treatment: Gait belt;Right knee immobilizer Activity Tolerance: Patient tolerated treatment well Patient left: in bed;with call bell/phone within reach;with family/visitor present   GP     [name] 05/07/2012, 3:38 PM
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
## 2012-05-07 NOTE — ED Notes (Signed)
Patient lying comfortably in bed.  Patient to be admitted.

## 2012-05-07 NOTE — ED Notes (Signed)
Report given to Lindsay, RN

## 2012-05-07 NOTE — Evaluation (Signed)
Physical Therapy Evaluation Patient Details Name: Holly Pope MRN: [id number] DOB: 1990/09/03 Today's Date: 05/07/2012 Time: 1478-2956 PT Time Calculation (min): 34 min  PT Assessment / Plan / Recommendation Clinical Impression  Pt was seen for initial tx of transfer and gait training.  She was very tearful during tx and quite fearful of trying to stand with walker.  She needed mod assist to sit at EOB, min assist to stand with walker.  Only able to ambulate about 48' with walker, very labored gait  but minimal pain with weight bearing.  She will definately need to use a walker rather than crutches for gait.  The family is making arrangements to have railings installed at steps to home and she will be able to hold onto both railings for climbing steps. Recommend HHPT at d/c to ensure gait stability/safety at home.                PT Assessment  Patient needs continued PT services    Follow Up Recommendations  Home health PT    Barriers to Discharge None      Equipment Recommendations  None recommended by PT    Recommendations for Other Services OT consult   Frequency 7X/week    Precautions / Restrictions Precautions Precautions: Posterior Hip Precaution Comments: pt had a posterior hip dislocation, so we will need to adhere to hip precautions to avoid redislocation Required Braces or Orthoses: Knee Immobilizer - Right Knee Immobilizer - Right: On at all times Restrictions Weight Bearing Restrictions: Yes RLE Weight Bearing: Weight bearing as tolerated   Pertinent Vitals/Pain       Mobility  Bed Mobility Bed Mobility: Supine to Sit;Sit to Supine Supine to Sit: 3: Mod assist;HOB elevated Sit to Supine: 3: Mod assist;HOB elevated Details for Bed Mobility Assistance: pt is unable to tolerate the HOB flat...she sleeps in a regulaar bed at home, so I recommended that she sleep in a recliner for awhile Transfers Transfers: Sit to Stand;Stand to Sit Sit to Stand: 4: Min  assist;From bed;With upper extremity assist Stand to Sit: 4: Min guard;To bed;With upper extremity assist Ambulation/Gait Ambulation/Gait Assistance: 4: Min assist Ambulation Distance (Feet): 12 Feet Assistive device: Rolling walker Gait Pattern: Within Functional Limits Gait velocity: immobilizer on RLE...gait very slow and labored but not much pain with weight bearing Stairs: No Wheelchair Mobility Wheelchair Mobility: No    Shoulder Instructions     Exercises     PT Diagnosis: Difficulty walking;Generalized weakness;Acute pain  PT Problem List: Decreased strength;Decreased activity tolerance;Decreased mobility;Decreased knowledge of use of DME;Decreased safety awareness;Decreased knowledge of precautions;Obesity;Pain PT Treatment Interventions: Gait training;DME instruction;Stair training;Patient/family education   PT Goals Acute Rehab PT Goals PT Goal Formulation: With patient/family Time For Goal Achievement: 05/14/12 Potential to Achieve Goals: Good Pt will go Supine/Side to Sit: with min assist;with HOB not 0 degrees (comment degree) PT Goal: Supine/Side to Sit - Progress: Goal set today Pt will go Sit to Supine/Side: with min assist;with HOB not 0 degrees (comment degree) PT Goal: Sit to Supine/Side - Progress: Goal set today Pt will go Sit to Stand: with supervision;with upper extremity assist PT Goal: Sit to Stand - Progress: Goal set today Pt will go Stand to Sit: with supervision;with upper extremity assist PT Goal: Stand to Sit - Progress: Goal set today Pt will Ambulate: 16 - 50 feet;with supervision;with rolling walker PT Goal: Ambulate - Progress: Goal set today Pt will Go Up / Down Stairs: 3-5 stairs;with rail(s);with min assist PT Goal:  Up/Down Stairs - Progress: Goal set today  Visit Information  Last PT Received On: 05/07/12    Subjective Data      Prior Functioning  Home Living Lives With: Significant other Available Help at Discharge:  Family;Available 24 hours/day Type of Home: Mobile home Home Access: Stairs to enter Entrance Stairs-Number of Steps: 3 Entrance Stairs-Rails: None (to have 2 rails installed today) Home Layout: One level Bathroom Shower/Tub: Tub/shower unit Home Adaptive Equipment: Walker - rolling Prior Function Level of Independence: Independent Able to Take Stairs?: Yes Driving: Yes Communication Communication: No difficulties    Cognition  Overall Cognitive Status: Appears within functional limits for tasks assessed/performed Arousal/Alertness: Awake/alert Orientation Level: Appears intact for tasks assessed Behavior During Session: [facility] for tasks performed Cognition - Other Comments: pt is emotionally labile, cries easily    Extremity/Trunk Assessment Right Lower Extremity Assessment RLE ROM/Strength/Tone: Unable to fully assess (in leg immobilizer) Left Lower Extremity Assessment LLE ROM/Strength/Tone: WFL for tasks assessed Trunk Assessment Trunk Assessment: Normal   Balance Balance Balance Assessed: No  End of Session PT - End of Session Equipment Utilized During Treatment: Gait belt;Right knee immobilizer Activity Tolerance: Patient tolerated treatment well;Patient limited by fatigue Patient left: in bed;with call bell/phone within reach;with family/visitor present Nurse Communication: Mobility status  GP     [name] 05/07/2012, 10:36 AM

## 2012-05-08 MED ORDER — HYDROCODONE-ACETAMINOPHEN 5-325 MG PO TABS: 1.0000 | ORAL_TABLET | Freq: Four times a day (QID) | ORAL | Status: DC | PRN
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
## 2012-05-08 NOTE — Progress Notes (Signed)
Physical Therapy Treatment Patient Details Name: Holly Pope MRN: [id number] DOB: 1990-10-01 Today's Date: 05/08/2012 Time: 9147-8295 PT Time Calculation (min): 48 min  PT Assessment / Plan / Recommendation Comments on Treatment Session  Pt is being d/c'd today.  She has no reported R hip pain during tx.  She is advised to limit gait to only that which  is necessary. Will have HHPT work with her at home.    Follow Up Recommendations       Barriers to Discharge        Equipment Recommendations  Rolling walker with 5" wheels (pt states her walker at home is broken)    Recommendations for Other Services    Frequency     Plan All goals met and education completed, patient dischaged from PT services;Equipment recommendations need to be updated    Precautions / Restrictions     Pertinent Vitals/Pain     Mobility  Bed Mobility Supine to Sit: 4: Min assist;HOB elevated Sit to Supine: 4: Min assist;HOB elevated Transfers Sit to Stand: 4: Min guard;From bed;With upper extremity assist;From chair/3-in-1 Stand to Sit: 5: Supervision;To chair/3-in-1;To bed Ambulation/Gait Ambulation/Gait Assistance: 5: Supervision Ambulation Distance (Feet): 20 Feet Assistive device: Rolling walker Gait Pattern: Within Functional Limits;Antalgic General Gait Details: Pt is still putting more weight on RLE than optimal...about 60# per scale reading.  Because of obesity, she is unable to ambulate with less weight than this.  I have strongly recommended that she only ambulate when necessary until more healing has taken place Stairs: Yes Stairs Assistance: 5: Supervision Stair Management Technique: Two rails;Step to pattern;Forwards Number of Stairs: 3  Wheelchair Mobility Wheelchair Mobility: No    Exercises     PT Diagnosis:    PT Problem List:   PT Treatment Interventions:     PT Goals Acute Rehab PT Goals PT Goal: Supine/Side to Sit - Progress: Met PT Goal: Sit to Supine/Side -  Progress: Met PT Goal: Sit to Stand - Progress: Met PT Goal: Stand to Sit - Progress: Met PT Goal: Ambulate - Progress: Met PT Goal: Up/Down Stairs - Progress: Met  Visit Information  Last PT Received On: 05/08/12    Subjective Data  Subjective: no c/o   Cognition       Balance     End of Session PT - End of Session Equipment Utilized During Treatment: Gait belt Activity Tolerance: Patient tolerated treatment well;Patient limited by fatigue Patient left: in bed;with call bell/phone within reach;with family/visitor present Nurse Communication: Mobility status   GP     [name] 05/08/2012, 10:47 AM

## 2012-05-08 NOTE — Progress Notes (Signed)
Pt discharged to personal vehicle driven by spouse.  Discharge meds and instructions reviewed with pt. With good understanding. Pt was recently medicated and rates right hip pain 4/10, No acute distress noted.

## 2012-05-09 ENCOUNTER — Other Ambulatory Visit: Payer: Self-pay | Admitting: *Deleted

## 2012-05-09 ENCOUNTER — Telehealth: Payer: Self-pay | Admitting: Orthopedic Surgery

## 2012-05-09 DIAGNOSIS — S73004A Unspecified dislocation of right hip, initial encounter: Secondary | ICD-10-CM

## 2012-05-09 NOTE — Telephone Encounter (Signed)
Holly Pope, will you get this order for this bedside commode?

## 2012-05-09 NOTE — Telephone Encounter (Signed)
Order faxed as requested

## 2012-05-09 NOTE — Telephone Encounter (Signed)
Abelina Bachelor called for his sister, Lidwina Kaner.  Wretha needs a bedside commode and asked if you will send an order to [employer].  She has an appointment 05/21/12 for hospital followup. Christopher's #-

## 2012-05-09 NOTE — Telephone Encounter (Signed)
Ok to order 

## 2012-05-11 ENCOUNTER — Ambulatory Visit (HOSPITAL_COMMUNITY)
Admission: RE | Admit: 2012-05-11 | Discharge: 2012-05-11 | Disposition: A | Discharge status: Home / Self Care | Payer: No Typology Code available for payment source | Source: Ambulatory Visit | Attending: Orthopedic Surgery | Admitting: Orthopedic Surgery

## 2012-05-11 DIAGNOSIS — M6281 Muscle weakness (generalized): Secondary | ICD-10-CM | POA: Insufficient documentation

## 2012-05-11 DIAGNOSIS — IMO0001 Reserved for inherently not codable concepts without codable children: Secondary | ICD-10-CM | POA: Insufficient documentation

## 2012-05-11 DIAGNOSIS — R262 Difficulty in walking, not elsewhere classified: Secondary | ICD-10-CM | POA: Insufficient documentation

## 2012-05-11 DIAGNOSIS — M25559 Pain in unspecified hip: Secondary | ICD-10-CM | POA: Insufficient documentation
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
## 2012-05-14 ENCOUNTER — Ambulatory Visit (HOSPITAL_COMMUNITY)
Admission: RE | Admit: 2012-05-14 | Discharge: 2012-05-14 | Disposition: A | Discharge status: Home / Self Care | Payer: No Typology Code available for payment source | Source: Ambulatory Visit

## 2012-05-14 NOTE — Progress Notes (Signed)
Physical Therapy Treatment Patient Details  Name: LENI PANKONIN MRN: [id number] Date of Birth: 1991-02-01  Today's Date: 05/14/2012 Time: 4098-1191 PT Time Calculation (min): 55 min  Visit#: 2  of 12   Re-eval: 06/10/12 Assessment Diagnosis: R hip dislocation  Surgical Date: 05/06/12 Next MD Visit: Dr. Romeo Apple - 05/21/12 Charge: Gait 23', therex 22', self care 10'   Subjective: Symptoms/Limitations Symptoms: Pt stated R hip really sore, pt ambulating with RW.  Pain scale 6/10. Pain Assessment Currently in Pain?: Yes Pain Score:   6 Pain Location: Hip Pain Orientation: Right  Precautions/Restrictions  Restrictions Weight Bearing Restrictions: Yes RLE Weight Bearing: Partial weight bearing RLE Partial Weight Bearing Percentage or Pounds: no more than 30# on RLE  Exercise/Treatments Standing Gait Training: Gait training with knee immobilizer on and max cueing to increase UE WB when wb on R LE x 23 Supine Short Arc Quad Sets: Right;10 reps Bridges: Limitations Bridges Limitations: attempted, unable did complete 10x 5" glut sets Straight Leg Raises: AAROM;Right;10 reps Other Supine Knee Exercises: Self care instructed 3 posterior hip precautions: no flexion beyond 90 degees, no adduction and no IR.  Pt able to state appropriate precautions on 5th time quizzed x 10' Sidelying Other Sidelying Knee Exercises: instructed proper technique sidelying to sit    Physical Therapy Assessment and Plan PT Assessment and Plan Clinical Impression Statement: Pt limited by pain.  Max cueing required to increase UE weight bearing when on R LE.  Pt educated on importance of following posterior hip precautions, able to state all 3 on the 5th tip quizzed.  Pt did require AA with most mat activities due to overall hip musculature weakness. PT Plan: Continue to improve gait, stress 30# WB limit and knowledge of posterior hip precautions.    Goals    Problem List Patient Active Problem  List  Diagnosis  . NSVD (normal spontaneous vaginal delivery)  . Hip dislocation, right    PT - End of Session Equipment Utilized During Treatment: Gait belt Activity Tolerance: Patient tolerated treatment well;Patient limited by fatigue;Patient limited by pain General Behavior During Session: [facility] for tasks performed Cognition: [facility] for tasks performed  GP    [name] 05/14/2012, 7:01 PM

## 2012-05-16 ENCOUNTER — Telehealth: Payer: Self-pay | Admitting: Orthopedic Surgery

## 2012-05-16 ENCOUNTER — Other Ambulatory Visit (HOSPITAL_COMMUNITY): Payer: Self-pay | Admitting: Orthopedic Surgery

## 2012-05-16 ENCOUNTER — Ambulatory Visit (HOSPITAL_COMMUNITY)
Admission: RE | Admit: 2012-05-16 | Discharge: 2012-05-16 | Disposition: A | Discharge status: Home / Self Care | Payer: No Typology Code available for payment source | Source: Ambulatory Visit | Attending: Orthopedic Surgery | Admitting: Orthopedic Surgery

## 2012-05-16 DIAGNOSIS — T148XXA Other injury of unspecified body region, initial encounter: Secondary | ICD-10-CM

## 2012-05-16 NOTE — Telephone Encounter (Signed)
Regarding hospital follow up appointment for Monday, 05/21/12, patient states needs to cancel appointment; states "Dr. Romeo Apple is sending her somewhere else".  Please advise.  Patient's phone number is [phone].

## 2012-05-16 NOTE — Progress Notes (Signed)
Physical Therapy Treatment Patient Details  Name: Holly Pope MRN: [id number] Date of Birth: 02/13/1991  Today's Date: 05/16/2012 Time: 1191-4782 PT Time Calculation (min): 55 min  Visit#: 3  of 12   Re-eval: 06/10/12  Charge: Gait 24' therex 18'  Subjective: Symptoms/Limitations Symptoms: Pt stated R LE really sore today, pain scale 9/10 today. Pain Assessment Currently in Pain?: Yes Pain Score:   9 Pain Location: Hip Pain Orientation: Right  Objective:   Exercise/Treatments Standing Gait Training: Gait training with knee immobilizer on and max cueing to increase UE WB when wb on R LE x 24' Supine Quad Sets: Right;10 reps Short Arc Quad Sets: Right;2 sets;10 reps Heel Slides: 10 reps Bridges: Limitations Bridges Limitations: attempted, unable did complete 10x 5" glut sets Straight Leg Raises: AAROM;Limitations Straight Leg Raises Limitations: 4 reps AAROM SLR; 5 alternating bent knee raise with AAROM ffor R LE Other Supine Knee Exercises: Self care pt able to state the posterior hip precautions     Physical Therapy Assessment and Plan PT Assessment and Plan Clinical Impression Statement: Pt able to correcly state the posterior hip precautions initially this session.  Pt continues to required max cueing to increase UE weight bearing when on R LE.  Pt limited by pain and overall weakness, pt educated on importance of completing exercises at home and encouraged to increase frequency for overall strengtheing to return to prior functional abilities. PT Plan: Continue to improve gait, stress 30# WB limit and knowledge of posterior hip precautions.    Goals    Problem List Patient Active Problem List  Diagnosis  . NSVD (normal spontaneous vaginal delivery)  . Hip dislocation, right    PT - End of Session Equipment Utilized During Treatment: Gait belt Activity Tolerance: Patient tolerated treatment well;Patient limited by fatigue;Patient limited by  pain General Behavior During Session: [facility] for tasks performed Cognition: [facility] for tasks performed  GP    [name] 05/16/2012, 6:00 PM

## 2012-05-17 ENCOUNTER — Ambulatory Visit (HOSPITAL_COMMUNITY)
Admission: RE | Admit: 2012-05-17 | Discharge: 2012-05-17 | Disposition: A | Discharge status: Home / Self Care | Payer: No Typology Code available for payment source | Source: Ambulatory Visit | Attending: Orthopedic Surgery | Admitting: Orthopedic Surgery

## 2012-05-17 NOTE — Progress Notes (Signed)
Physical Therapy Treatment Patient Details  Name: Holly Pope MRN: [id number] Date of Birth: [birth year]  Today's Date: 05/17/2012 Time: 4098-1191 PT Time Calculation (min): 53 min  Visit#: 4  of 12   Re-eval: 06/10/12  Charge: gait 12', therex 38'  Subjective: Symptoms/Limitations Symptoms: Pt stated R hip pain scale 6/10 today. Pain Assessment Currently in Pain?: Yes Pain Score:   6 Pain Location: Hip Pain Orientation: Right  Objective:   Exercise/Treatments Standing Gait Training: Gait training with knee immobilizer on and max cueing to increase UE WB when wb on R LE x 12' Seated Long Arc Quad: 10 reps Other Seated Knee Exercises: STS with hands for UE strengthening 3 reps Other Seated Knee Exercises:   Supine Short Arc Quad Sets: Right;AROM;2 sets;10 reps Bridges: 5 reps;Limitations Bridges Limitations: partial bridge, visible glut contraction Other Supine Knee Exercises: Glut sets 10x 10" Other Supine Knee Exercises: Supine UE strengthening with 3# dumbell/freeweights.  10 reps: shoulder flexion, chest press and bicep curls  Physical Therapy Assessment and Plan PT Assessment and Plan Clinical Impression Statement: Pt continues to require max cueing to increase UE weight bearing when on RLE.  Pt quad and glut strength is improving, able to complete LAQ and SAQ independently this session with no AA and able to visualize glut contractions.  Noted visible LE musculature fatigue with activities  Add UE strengthening activities to assist with the UE wb with gait. PT Plan: Continue with current POC for improved gait mechanics following 30# WB limit, continue overall core and LE strengthening.    Goals    Problem List Patient Active Problem List  Diagnosis  . NSVD (normal spontaneous vaginal delivery)  . Hip dislocation, right    PT - End of Session Activity Tolerance: Patient tolerated treatment well;Patient limited by fatigue General Behavior During Session:  [facility] for tasks performed Cognition: [facility] for tasks performed  GP    Holly Pope 05/17/2012, 5:42 PM

## 2012-05-18 ENCOUNTER — Ambulatory Visit (HOSPITAL_COMMUNITY)
Admission: RE | Admit: 2012-05-18 | Discharge: 2012-05-18 | Disposition: A | Discharge status: Home / Self Care | Payer: No Typology Code available for payment source | Source: Ambulatory Visit | Attending: Orthopedic Surgery | Admitting: Orthopedic Surgery

## 2012-05-18 DIAGNOSIS — S72009D Fracture of unspecified part of neck of unspecified femur, subsequent encounter for closed fracture with routine healing: Secondary | ICD-10-CM | POA: Insufficient documentation

## 2012-05-18 DIAGNOSIS — T148XXA Other injury of unspecified body region, initial encounter: Secondary | ICD-10-CM

## 2012-05-18 DIAGNOSIS — M24059 Loose body in unspecified hip: Secondary | ICD-10-CM | POA: Insufficient documentation

## 2012-05-21 ENCOUNTER — Ambulatory Visit: Payer: Medicaid Other | Admitting: Orthopedic Surgery

## 2012-05-22 ENCOUNTER — Emergency Department (HOSPITAL_COMMUNITY): Payer: No Typology Code available for payment source

## 2012-05-22 ENCOUNTER — Encounter (HOSPITAL_COMMUNITY): Payer: Self-pay | Admitting: *Deleted

## 2012-05-22 ENCOUNTER — Observation Stay (HOSPITAL_COMMUNITY)
Admission: EM | Admit: 2012-05-22 | Discharge: 2012-05-24 | Disposition: A | Discharge status: Home / Self Care | Payer: No Typology Code available for payment source | Attending: Orthopedic Surgery | Admitting: Orthopedic Surgery

## 2012-05-22 ENCOUNTER — Telehealth: Payer: Self-pay | Admitting: Orthopedic Surgery

## 2012-05-22 ENCOUNTER — Inpatient Hospital Stay (HOSPITAL_COMMUNITY): Admission: RE | Admit: 2012-05-22 | Discharge: 2012-05-22 | Payer: Medicaid Other | Source: Ambulatory Visit

## 2012-05-22 DIAGNOSIS — S73006A Unspecified dislocation of unspecified hip, initial encounter: Secondary | ICD-10-CM

## 2012-05-22 DIAGNOSIS — M24459 Recurrent dislocation, unspecified hip: Secondary | ICD-10-CM | POA: Insufficient documentation

## 2012-05-22 DIAGNOSIS — M25559 Pain in unspecified hip: Secondary | ICD-10-CM | POA: Insufficient documentation

## 2012-05-22 DIAGNOSIS — S32409A Unspecified fracture of unspecified acetabulum, initial encounter for closed fracture: Principal | ICD-10-CM | POA: Insufficient documentation

## 2012-05-22 DIAGNOSIS — S73004A Unspecified dislocation of right hip, initial encounter: Secondary | ICD-10-CM

## 2012-05-22 HISTORY — DX: Unspecified dislocation of right hip, initial encounter: S73.004A

## 2012-05-22 MED ORDER — HYDROMORPHONE HCL PF 1 MG/ML IJ SOLN
1.0000 mg | Freq: Once | INTRAMUSCULAR | Status: AC
  Administered 2012-05-22: 1 mg via INTRAVENOUS
  Filled 2012-05-22: qty 1

## 2012-05-22 MED ORDER — PROPOFOL 10 MG/ML IV BOLUS
INTRAVENOUS | Status: AC
  Administered 2012-05-22: 100 mg
  Filled 2012-05-22: qty 1

## 2012-05-22 MED ORDER — HYDROMORPHONE HCL PF 2 MG/ML IJ SOLN
2.0000 mg | Freq: Once | INTRAMUSCULAR | Status: AC
  Administered 2012-05-22: 2 mg via INTRAMUSCULAR
  Filled 2012-05-22: qty 1

## 2012-05-22 MED ORDER — MIDAZOLAM HCL 5 MG/5ML IJ SOLN
INTRAMUSCULAR | Status: AC
  Administered 2012-05-22: 4 mg
  Filled 2012-05-22: qty 5
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
## 2012-05-22 NOTE — Progress Notes (Signed)
Pt presents to therapy in tears. Pt reports 10/10 pain in her R hip. Pt was taken to ED. Therapy cancelled today.  Seth Bake, PTA

## 2012-05-22 NOTE — H&P (Addendum)
  Please see consult note   The hip has re dislocated, and is unstable therefore the patient is admitted for neurovascular checks and I will consult with Dr Carola Frost as to how to proceed

## 2012-05-22 NOTE — Telephone Encounter (Signed)
Holly Pope. at [facility] called asking if Dr. Romeo Apple was in the office this afternoon.  She said [name] was there and was having a lot of hip pain,and she wanted Dr. Romeo Apple to see her.  I told her I would schedule her for Wednesday, 05/23/12, but the patient did not want An appointment, said she was going to the ER today.

## 2012-05-23 ENCOUNTER — Encounter (HOSPITAL_COMMUNITY): Payer: Self-pay | Admitting: *Deleted

## 2012-05-23 ENCOUNTER — Observation Stay (HOSPITAL_COMMUNITY): Payer: No Typology Code available for payment source

## 2012-05-23 MED ORDER — DIPHENHYDRAMINE HCL 12.5 MG/5ML PO ELIX: 12.5000 mg | ORAL_SOLUTION | ORAL | Status: DC | PRN

## 2012-05-23 MED ORDER — METHOCARBAMOL 100 MG/ML IJ SOLN
500.0000 mg | Freq: Four times a day (QID) | INTRAVENOUS | Status: DC | PRN
  Filled 2012-05-23: qty 5

## 2012-05-23 MED ORDER — METHOCARBAMOL 500 MG PO TABS: 500.0000 mg | ORAL_TABLET | Freq: Four times a day (QID) | ORAL | Status: DC | PRN

## 2012-05-23 MED ORDER — ENOXAPARIN SODIUM 40 MG/0.4ML ~~LOC~~ SOLN
40.0000 mg | SUBCUTANEOUS | Status: DC
  Administered 2012-05-23 – 2012-05-24 (×2): 40 mg via SUBCUTANEOUS
  Filled 2012-05-23 (×2): qty 0.4

## 2012-05-23 MED ORDER — DOCUSATE SODIUM 100 MG PO CAPS
100.0000 mg | ORAL_CAPSULE | Freq: Two times a day (BID) | ORAL | Status: DC
  Administered 2012-05-23 – 2012-05-24 (×4): 100 mg via ORAL
  Filled 2012-05-23 (×4): qty 1

## 2012-05-23 MED ORDER — MENTHOL 3 MG MT LOZG: 1.0000 | LOZENGE | OROMUCOSAL | Status: DC | PRN

## 2012-05-23 MED ORDER — HYDROCODONE-ACETAMINOPHEN 5-325 MG PO TABS
1.0000 | ORAL_TABLET | Freq: Four times a day (QID) | ORAL | Status: DC | PRN
  Administered 2012-05-23 – 2012-05-24 (×3): 1 via ORAL
  Filled 2012-05-23 (×3): qty 1

## 2012-05-23 MED ORDER — METOCLOPRAMIDE HCL 5 MG/ML IJ SOLN: 5.0000 mg | Freq: Three times a day (TID) | INTRAMUSCULAR | Status: DC | PRN

## 2012-05-23 MED ORDER — ALUM & MAG HYDROXIDE-SIMETH 200-200-20 MG/5ML PO SUSP: 30.0000 mL | ORAL | Status: DC | PRN

## 2012-05-23 MED ORDER — HYDROMORPHONE HCL PF 1 MG/ML IJ SOLN: 0.5000 mg | INTRAMUSCULAR | Status: DC | PRN

## 2012-05-23 MED ORDER — SODIUM CHLORIDE 0.9 % IV SOLN
INTRAVENOUS | Status: DC
  Administered 2012-05-23: 07:00:00 via INTRAVENOUS
  Administered 2012-05-24: 20 mL/h via INTRAVENOUS

## 2012-05-23 MED ORDER — PHENOL 1.4 % MT LIQD: 1.0000 | OROMUCOSAL | Status: DC | PRN

## 2012-05-23 MED ORDER — ACETAMINOPHEN 325 MG PO TABS: 650.0000 mg | ORAL_TABLET | Freq: Four times a day (QID) | ORAL | Status: DC | PRN

## 2012-05-23 MED ORDER — OXYCODONE HCL 5 MG PO TABS
5.0000 mg | ORAL_TABLET | ORAL | Status: DC | PRN
  Administered 2012-05-23: 5 mg via ORAL
  Filled 2012-05-23: qty 1

## 2012-05-23 MED ORDER — FERROUS SULFATE 325 (65 FE) MG PO TABS
325.0000 mg | ORAL_TABLET | Freq: Three times a day (TID) | ORAL | Status: DC
  Administered 2012-05-23 – 2012-05-24 (×4): 325 mg via ORAL
  Filled 2012-05-23 (×4): qty 1

## 2012-05-23 MED ORDER — ACETAMINOPHEN 650 MG RE SUPP: 650.0000 mg | Freq: Four times a day (QID) | RECTAL | Status: DC | PRN

## 2012-05-23 MED ORDER — ONDANSETRON HCL 4 MG/2ML IJ SOLN: 4.0000 mg | Freq: Four times a day (QID) | INTRAMUSCULAR | Status: DC | PRN

## 2012-05-23 MED ORDER — METOCLOPRAMIDE HCL 10 MG PO TABS: 5.0000 mg | ORAL_TABLET | Freq: Three times a day (TID) | ORAL | Status: DC | PRN

## 2012-05-23 MED ORDER — ONDANSETRON HCL 4 MG PO TABS: 4.0000 mg | ORAL_TABLET | Freq: Four times a day (QID) | ORAL | Status: DC | PRN

## 2012-05-23 NOTE — Clinical Social Work Note (Signed)
CSW received referral as part of hip protocol. Pt plans to return home at d/c and had outpatient PT services. No CSW needs at this time. Signing off but can be reconsulted if needed.  Derenda Fennel, [place] [phone]

## 2012-05-23 NOTE — Progress Notes (Signed)
Subjective: Mild hip pain    Objective: Vital signs in last 24 hours: Temp:  [98.1 F (36.7 C)-99.3 F (37.4 C)] 98.1 F (36.7 C) (10/16 0423) Pulse Rate:  [95-119] 101  (10/16 0423) Resp:  [15-20] 18  (10/16 0423) BP: (95-132)/(40-88) 103/64 mmHg (10/16 0423) SpO2:  [94 %-100 %] 99 % (10/16 0423) FiO2 (%):  [28 %-100 %] 28 % (10/16 0119) Weight:  [284 lb 13.4 oz (129.2 kg)-289 lb (131.09 kg)] 284 lb 13.4 oz (129.2 kg) (10/16 0100)  Intake/Output from previous day: 10/15 0701 - 10/16 0700 In: 111.1 [I.V.:111.1] Out: -  Intake/Output this shift:    No results found for this basename: HGB:5 in the last 72 hours No results found for this basename: WBC:2,RBC:2,HCT:2,PLT:2 in the last 72 hours No results found for this basename: NA:2,K:2,CL:2,CO2:2,BUN:2,CREATININE:2,GLUCOSE:2,CALCIUM:2 in the last 72 hours No results found for this basename: LABPT:2,INR:2 in the last 72 hours  Neurologically intact Dorsiflexion/Plantar flexion intact  Assessment/Plan: S/P right hip recurrent fracture dislocation of the hip  S/P reduction   Bed rest  Case to be discussed with Dr Peter Minium 05/23/2012, 7:56 AM

## 2012-05-23 NOTE — Progress Notes (Signed)
Per Dr. Romeo Apple, pt is on bedrest.  Since this is the case, we will d/c our involvement until further orders are received.

## 2012-05-23 NOTE — Care Management Note (Signed)
    Page 1 of 2   05/24/2012     2:14:37 PM   CARE MANAGEMENT NOTE 05/24/2012  Patient:  Holly Pope, Holly Pope   Account Number:  [id number]  Date Initiated:  05/23/2012  Documentation initiated by:  Sharrie Rothman  Subjective/Objective Assessment:   Pt admitted from home with recurrent dislocation of hip. Pt lives with husband and will return home at discharge. Pt has a walker and is currently doing outpt PT at [facility].     Action/Plan:   No CM or HH needs noted. Will follow in case HH needs do arise.   Anticipated DC Date:  05/24/2012   Anticipated DC Plan:  HOME/SELF CARE      DC Planning Services  CM consult      PAC Choice  DURABLE MEDICAL EQUIPMENT   Choice offered to / List presented to:  C-1 Patient   DME arranged  WHEELCHAIR - MANUAL      DME agency  [facility]        Status of service:  Completed, signed off Medicare Important Message given?   (If response is "NO", the following Medicare IM given date fields will be blank) Date Medicare IM given:   Date Additional Medicare IM given:    Discharge Disposition:  HOME/SELF CARE  Per UR Regulation:    If discussed at Long Length of Stay Meetings, dates discussed:    Comments:  05/24/12 1413 Arlyss Queen, RN BSN CM Pt discharged home today. Wheelchair ordered from [place] that will be delivered to pts room prior to discharge. No HH or other CM needs noted. Pt and pts nurse aware of discharge arrangements.  05/23/12 1100 Arlyss Queen, RN BSN CM

## 2012-05-23 NOTE — Progress Notes (Signed)
UR Chart Review Completed  

## 2012-05-24 ENCOUNTER — Ambulatory Visit (HOSPITAL_COMMUNITY): Payer: Medicaid Other

## 2012-05-24 MED ORDER — HYDROCODONE-ACETAMINOPHEN 10-325 MG PO TABS: 1.0000 | ORAL_TABLET | ORAL | Status: DC | PRN

## 2012-05-24 NOTE — Discharge Summary (Signed)
Physician Discharge Summary  Patient ID: Holly Pope MRN: [id number] DOB/AGE: 1990-10-08 21 y.o.  Admit date: 05/22/2012 Discharge date: 05/24/2012  Admission Diagnoses: recurrent right hip fracture dislocation   Discharge Diagnoses: same  Active Problems:  * No active hospital problems. *    Discharged Condition: good  Hospital Course: She came to the ER for increased pain. X-rays showed recurrent fracture dislocation. She had closed reduction in the ER with 100 mg Propofol and 4 mg Versed. SHE HAS VERY POOR PERIPHERAL IV ACCESS. Repeat CT scan showed IMPRESSION:  1. Findings consistent with posterior right hip dislocation with  new flattening of the anteromedial aspect of the right femoral head  consistent with impaction fracture. Fracture of the posterior rim  of the right acetabulum and two-to-three tiny bony fragments within  the right hip joint is unchanged.  2. Right L5 transverse process fracture, unchanged.   Consults: None  Significant Diagnostic Studies: radiology: CT scan:   Treatments: analgesia: norco and dilaudid and therapies: PT  Discharge Exam: Blood pressure 109/69, pulse 90, temperature 98 F (36.7 C), temperature source Oral, resp. rate 16, height 5\' 2"  (1.575 m), weight 284 lb 13.4 oz (129.2 kg), last menstrual period 05/06/2012, SpO2 100.00%. General appearance: alert, cooperative, appears stated age, morbidly obese and depressed  Disposition: 01-Home or Self Care  Discharge Orders    Future Orders Please Complete By Expires   Diet - low sodium heart healthy      Call MD / Call 911      Comments:   If you experience chest pain or shortness of breath, CALL 911 and be transported to the hospital emergency room.  If you develope a fever above 101 F, pus (white drainage) or increased drainage or redness at the wound, or calf pain, call your surgeon's office.   Constipation Prevention      Comments:   Drink plenty of fluids.  Prune juice may be  helpful.  You may use a stool softener, such as Colace (over the counter) 100 mg twice a day.  Use MiraLax (over the counter) for constipation as needed.   Increase activity slowly as tolerated      Discharge instructions      Comments:   No weight bearing   Transfer to wheelchair only   Follow the hip precautions as taught in Physical Therapy      Do not sit on low chairs, stoools or toilet seats, as it may be difficult to get up from low surfaces      PR EXTRA HEAVY DUTY WHEELCHAIR          Medication List     As of 05/24/2012  8:06 AM    STOP taking these medications         HYDROcodone-acetaminophen 5-325 MG per tablet   Commonly known as: NORCO/VICODIN      TAKE these medications         ferrous sulfate 325 (65 FE) MG tablet   Take 1 tablet (325 mg total) by mouth daily with breakfast.      HYDROcodone-acetaminophen 10-325 MG per tablet   Commonly known as: NORCO   Take 1 tablet by mouth every 4 (four) hours as needed for pain.      PRENATAL VITAMIN PO   Take 1 tablet by mouth daily.           Follow-up Information    Follow up with [name], MD. Schedule an appointment as soon as possible for a  visit on 05/29/2012.   Contact information:   [address], [place] [address] [phone]          Signed: [name] 05/24/2012, 8:06 AM

## 2012-05-24 NOTE — Progress Notes (Signed)
Note for 05/24/2012 at 1600. Pt. And family were provided with discharge instructions both verbal and written. Any of pts and family's questions were answered and prescriptions were provided, as well as providers directions for taking them. Pts was in distress throughout discharge, was uncooperative in transfer to wheelchair and vehicle, demonstrated by spells of crying and refusing to express verbal communication. Pt. Was encouraged and assisted by family, two nurses, and a tech to insure safe transfer. Hip precautions were maintained and the pts safe transfer was priority. Pt. And family were verbally comforted throughout process. Wheelchair and walker were sent home with pt. IV was dc'd, Iv site was clean, dry, and intact, as well as the IV itself. Pts. Belongings were returned and here health status was stable upon discharge.

## 2012-05-24 NOTE — Evaluation (Signed)
Physical Therapy Evaluation Patient Details Name: Holly Pope MRN: [id number] DOB: 1991/06/18 Today's Date: 05/24/2012 Time: 4098-1191 PT Time Calculation (min): 72 min  PT Assessment / Plan / Recommendation Clinical Impression  Pt is seen again after 2nd dislocation of R hip.  MD relocated it 2 days ago and pt has been on bedrest since.  Family was present during my visit.  Pt and family were again instructed in posterior hip precautions as well as adherence to NWB RLE.  Pt often cries uncontrollably for no discernable reason, but we were finally able to successfully complete a bed to w/c transfer with the assist of her brother.  There is no reaon why she cannot do this with min assist, but because of emotional instability it required max assist. Family was instructed in all scenarios of transfer at home and feel that they understand all teaching.               PT Assessment  Patent does not need any further PT services    Follow Up Recommendations       Does the patient have the potential to tolerate intense rehabilitation      Barriers to Discharge        Equipment Recommendations       Recommendations for Other Services     Frequency      Precautions / Restrictions Precautions Precautions: Posterior Hip Restrictions Weight Bearing Restrictions: Yes RLE Weight Bearing: Non weight bearing   Pertinent Vitals/Pain       Mobility  Bed Mobility Supine to Sit: 3: Mod assist;HOB elevated Sit to Supine: Not Tested (comment) Details for Bed Mobility Assistance: pt has been sleeping in a recliner Transfers Transfers: Stand Pivot Transfers Sit to Stand: 3: Mod assist;With upper extremity assist;From bed Stand to Sit: 4: Min assist;With upper extremity assist;To chair/3-in-1 Stand Pivot Transfers: 2: Max assist Details for Transfer Assistance: Pt and family instructed in pivot transfer, NWB RLE.Marland KitchenMarland KitchenPt refused to  try to get OOB and only agreed to try after  major urging of  me and her family.Marland KitchenMarland KitchenWe finally had her brother assist with the pivot transfer as she trusts him.Marland KitchenMarland KitchenI stood alongside and instructed pt and her brother in correct transfer. A successful bed to w/c transfer was successfully completed.  They were both instructed in a transfer to [facility] by my domonstration and verbal instructions.    Ambulation/Gait Ambulation/Gait Assistance: Not tested (comment)    Shoulder Instructions     Exercises     PT Diagnosis:    PT Problem List:   PT Treatment Interventions:     PT Goals    Visit Information  Last PT Received On: 05/24/12    Subjective Data  Subjective: attempted to see pt this AM, but she was crying inconsolably...would not explain why..denied pain...this afternoon, pt lying quietly in bed and reluctantly agreeable to work with me...family present Patient Stated Goal: none stated   Prior Functioning  Home Living Lives With: Family Available Help at Discharge: Family;Available 24 hours/day Home Access: Stairs to enter [employer] of Steps: husband to build a ramp today Entrance Stairs-Rails: Right;Left;Can reach both Prior Function Level of Independence: Independent with assistive device(s) Able to Take Stairs?: Yes Driving: Yes Vocation: On disability Communication Communication: No difficulties    Cognition  Overall Cognitive Status: Appears within functional limits for tasks assessed/performed Arousal/Alertness: Awake/alert Orientation Level: Appears intact for tasks assessed Behavior During Session: [facility] for tasks performed Cognition - Other Comments: pt cries frequently and will  not explain the reason for crying...her family states that this is her nature    Extremity/Trunk Assessment Right Lower Extremity Assessment RLE ROM/Strength/Tone: Unable to fully assess Left Lower Extremity Assessment LLE ROM/Strength/Tone: [facility] for tasks assessed   Balance Balance Balance Assessed: No  End of Session PT - End of  Session Equipment Utilized During Treatment: Gait belt Activity Tolerance: Treatment limited secondary to agitation Patient left: in chair;with call bell/phone within reach;with family/visitor present;with nursing in room Nurse Communication: Mobility status  GP     [name] 05/24/2012, 2:34 PM

## 2012-05-24 NOTE — Progress Notes (Deleted)
UR chart review completed.  

## 2012-05-25 ENCOUNTER — Ambulatory Visit (HOSPITAL_COMMUNITY): Payer: Medicaid Other | Admitting: Physical Therapy

## 2012-05-28 ENCOUNTER — Ambulatory Visit (HOSPITAL_COMMUNITY): Payer: Medicaid Other | Admitting: Physical Therapy

## 2012-05-29 ENCOUNTER — Encounter: Payer: Self-pay | Admitting: Orthopedic Surgery

## 2012-05-29 ENCOUNTER — Ambulatory Visit: Payer: Medicaid Other | Admitting: Orthopedic Surgery

## 2012-05-30 ENCOUNTER — Ambulatory Visit (HOSPITAL_COMMUNITY): Payer: Medicaid Other

## 2012-05-30 NOTE — Telephone Encounter (Signed)
Holly Pope missed 10/14./13 and 05/29/12 appointments.  She has rescheduled for 06/05/12

## 2012-06-01 ENCOUNTER — Ambulatory Visit (HOSPITAL_COMMUNITY): Payer: Medicaid Other

## 2012-06-01 ENCOUNTER — Ambulatory Visit (HOSPITAL_COMMUNITY): Payer: Medicaid Other | Admitting: Physical Therapy

## 2012-06-04 ENCOUNTER — Encounter (HOSPITAL_COMMUNITY): Payer: Self-pay | Admitting: *Deleted

## 2012-06-04 ENCOUNTER — Encounter: Payer: Self-pay | Admitting: Orthopedic Surgery

## 2012-06-04 ENCOUNTER — Emergency Department (HOSPITAL_COMMUNITY): Payer: Medicaid Other

## 2012-06-04 ENCOUNTER — Ambulatory Visit (INDEPENDENT_AMBULATORY_CARE_PROVIDER_SITE_OTHER): Payer: Medicaid Other | Admitting: Orthopedic Surgery

## 2012-06-04 ENCOUNTER — Emergency Department (HOSPITAL_COMMUNITY)
Admission: EM | Admit: 2012-06-04 | Discharge: 2012-06-04 | Disposition: A | Discharge status: Other Acute Inpatient Hospital | Payer: Medicaid Other | Attending: Emergency Medicine | Admitting: Emergency Medicine

## 2012-06-04 VITALS — BP 98/60 | Ht 62.0 in | Wt 284.0 lb

## 2012-06-04 DIAGNOSIS — Z79899 Other long term (current) drug therapy: Secondary | ICD-10-CM | POA: Insufficient documentation

## 2012-06-04 DIAGNOSIS — S73014A Posterior dislocation of right hip, initial encounter: Secondary | ICD-10-CM

## 2012-06-04 DIAGNOSIS — S73006A Unspecified dislocation of unspecified hip, initial encounter: Secondary | ICD-10-CM

## 2012-06-04 DIAGNOSIS — X58XXXA Exposure to other specified factors, initial encounter: Secondary | ICD-10-CM | POA: Insufficient documentation

## 2012-06-04 DIAGNOSIS — S32409A Unspecified fracture of unspecified acetabulum, initial encounter for closed fracture: Secondary | ICD-10-CM | POA: Insufficient documentation

## 2012-06-04 DIAGNOSIS — S32401A Unspecified fracture of right acetabulum, initial encounter for closed fracture: Secondary | ICD-10-CM

## 2012-06-04 DIAGNOSIS — S73004A Unspecified dislocation of right hip, initial encounter: Secondary | ICD-10-CM

## 2012-06-04 DIAGNOSIS — Y929 Unspecified place or not applicable: Secondary | ICD-10-CM | POA: Insufficient documentation

## 2012-06-04 DIAGNOSIS — S73016A Posterior dislocation of unspecified hip, initial encounter: Secondary | ICD-10-CM | POA: Insufficient documentation

## 2012-06-04 DIAGNOSIS — Y939 Activity, unspecified: Secondary | ICD-10-CM | POA: Insufficient documentation

## 2012-06-04 DIAGNOSIS — Z87828 Personal history of other (healed) physical injury and trauma: Secondary | ICD-10-CM | POA: Insufficient documentation

## 2012-06-04 LAB — CBC WITH DIFFERENTIAL/PLATELET
Basophils Absolute: 0 10*3/uL (ref 0.0–0.1)
Basophils Relative: 0 % (ref 0–1)
Eosinophils Absolute: 0.2 10*3/uL (ref 0.0–0.7)
Eosinophils Relative: 2 % (ref 0–5)
HCT: 32.3 % — ABNORMAL LOW (ref 36.0–46.0)
Hemoglobin: 10.1 g/dL — ABNORMAL LOW (ref 12.0–15.0)
Lymphocytes Relative: 18 % (ref 12–46)
Lymphs Abs: 1.9 10*3/uL (ref 0.7–4.0)
MCH: 25.5 pg — ABNORMAL LOW (ref 26.0–34.0)
MCHC: 31.3 g/dL (ref 30.0–36.0)
MCV: 81.6 fL (ref 78.0–100.0)
Monocytes Absolute: 0.5 10*3/uL (ref 0.1–1.0)
Monocytes Relative: 4 % (ref 3–12)
Neutro Abs: 8.2 10*3/uL — ABNORMAL HIGH (ref 1.7–7.7)
Neutrophils Relative %: 76 % (ref 43–77)
Platelets: 475 10*3/uL — ABNORMAL HIGH (ref 150–400)
RBC: 3.96 MIL/uL (ref 3.87–5.11)
RDW: 14.8 % (ref 11.5–15.5)
WBC: 10.8 10*3/uL — ABNORMAL HIGH (ref 4.0–10.5)

## 2012-06-04 LAB — COMPREHENSIVE METABOLIC PANEL
ALT: 12 U/L (ref 0–35)
AST: 16 U/L (ref 0–37)
Albumin: 3.7 g/dL (ref 3.5–5.2)
Alkaline Phosphatase: 159 U/L — ABNORMAL HIGH (ref 39–117)
BUN: 14 mg/dL (ref 6–23)
CO2: 26 mEq/L (ref 19–32)
Calcium: 10.1 mg/dL (ref 8.4–10.5)
Chloride: 101 mEq/L (ref 96–112)
Creatinine, Ser: 0.62 mg/dL (ref 0.50–1.10)
GFR calc Af Amer: >90 mL/min (ref >90)
GFR calc non Af Amer: >90 mL/min (ref >90)
Glucose, Bld: 96 mg/dL (ref 70–99)
Potassium: 3.7 mEq/L (ref 3.5–5.1)
Sodium: 138 mEq/L (ref 135–145)
Total Bilirubin: 0.5 mg/dL (ref 0.3–1.2)
Total Protein: 7.8 g/dL (ref 6.0–8.3)

## 2012-06-04 LAB — PREGNANCY, URINE: Preg Test, Ur: NEGATIVE

## 2012-06-04 MED ORDER — FENTANYL CITRATE 0.05 MG/ML IJ SOLN
50.0000 ug | INTRAMUSCULAR | Status: AC | PRN
  Administered 2012-06-04 (×2): 50 ug via INTRAVENOUS
  Filled 2012-06-04 (×2): qty 2

## 2012-06-04 MED ORDER — SODIUM CHLORIDE 0.9 % IV SOLN
INTRAVENOUS | Status: DC
  Administered 2012-06-04: 21:00:00 via INTRAVENOUS
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
## 2012-06-04 NOTE — ED Notes (Signed)
Pt compains of 9/10 pain, fentanyl given per prn order
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
## 2012-06-04 NOTE — ED Notes (Signed)
[facility] here, pt being transferred at this time to [facility]
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
## 2012-06-04 NOTE — ED Notes (Signed)
Dr. Clarene Duke on phone with Dr. Romeo Apple.
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
## 2012-06-04 NOTE — Patient Instructions (Signed)
Continue minimal toe touch weight bearing right leg   Go to hospital for pelvic x-rays.

## 2012-06-11 ENCOUNTER — Telehealth: Payer: Self-pay | Admitting: Orthopedic Surgery

## 2012-06-11 NOTE — Telephone Encounter (Signed)
Vernona Rieger with [facility] called about Holly Pope.  [facility] hospital has ordered home intermitted skilled nursing and PT for Salome. She does not have a primary care doctor and Vernona Rieger is asking if you will sign the order. Laura's # [phone]

## 2012-06-12 NOTE — Telephone Encounter (Signed)
Dr. Romeo Apple verbally declined to sign the order, advised Vernona Rieger of his reply

## 2012-07-02 ENCOUNTER — Ambulatory Visit: Payer: Medicaid Other | Admitting: Orthopedic Surgery

## 2013-05-29 ENCOUNTER — Encounter (HOSPITAL_COMMUNITY): Payer: Self-pay | Admitting: Emergency Medicine

## 2013-05-29 ENCOUNTER — Emergency Department (HOSPITAL_COMMUNITY)
Admission: EM | Admit: 2013-05-29 | Discharge: 2013-05-29 | Disposition: A | Discharge status: Home / Self Care | Payer: Medicaid Other | Attending: Emergency Medicine | Admitting: Emergency Medicine

## 2013-05-29 DIAGNOSIS — H6120 Impacted cerumen, unspecified ear: Secondary | ICD-10-CM | POA: Insufficient documentation

## 2013-05-29 DIAGNOSIS — Z9889 Other specified postprocedural states: Secondary | ICD-10-CM | POA: Insufficient documentation

## 2013-05-29 DIAGNOSIS — H9190 Unspecified hearing loss, unspecified ear: Secondary | ICD-10-CM | POA: Insufficient documentation

## 2013-05-29 DIAGNOSIS — Z87828 Personal history of other (healed) physical injury and trauma: Secondary | ICD-10-CM | POA: Insufficient documentation

## 2013-05-29 DIAGNOSIS — H6122 Impacted cerumen, left ear: Secondary | ICD-10-CM

## 2013-05-29 DIAGNOSIS — J3489 Other specified disorders of nose and nasal sinuses: Secondary | ICD-10-CM | POA: Insufficient documentation

## 2013-05-29 NOTE — ED Notes (Signed)
Pain lt ear,for 2 weeks, Pt  Thinks may be due to wisdom tooth coming in

## 2013-05-29 NOTE — ED Provider Notes (Signed)
Medical screening examination/treatment/procedure(s) were performed by non-physician practitioner and as supervising physician I was immediately available for consultation/collaboration.  EKG Interpretation   None        Lindberg Zenon, MD 05/29/13 2356 

## 2013-05-29 NOTE — ED Provider Notes (Signed)
CSN: [id number]     Arrival date & time 05/29/13  1858 History   First MD Initiated Contact with Patient 05/29/13 1933     Chief Complaint  Patient presents with  . Otalgia   (Consider location/radiation/quality/duration/timing/severity/associated sxs/prior Treatment) Patient is a 22 y.o. female presenting with ear pain. The history is provided by the patient.  Otalgia Location:  Left Quality:  Aching Severity:  Mild Onset quality:  Gradual Duration:  2 weeks Timing:  Constant Progression:  Worsening Chronicity:  New Relieved by:  Nothing Worsened by:  Nothing tried Ineffective treatments:  None tried Associated symptoms: hearing loss   Associated symptoms: no abdominal pain, no congestion, no cough, no ear discharge, no fever, no rhinorrhea and no sore throat     Past Medical History  Diagnosis Date  . H/O removal of neck cyst   . Morbidly obese   . Hip dislocation, right 05/06/2012   Past Surgical History  Procedure Laterality Date  . Cystectomy  2002    rmoval from neck   Family History  Problem Relation Age of Onset  . Heart disease Mother   . Hypertension Mother   . Heart disease Father   . Hypertension Father   . Hypertension Brother    History  Substance Use Topics  . Smoking status: Never Smoker   . Smokeless tobacco: Never Used  . Alcohol Use: No   OB History   Grav Para Term Preterm Abortions TAB SAB Ect Mult Living   1 1 1       1      Review of Systems  Constitutional: Negative for fever and chills.  HENT: Positive for ear pain and hearing loss. Negative for congestion, ear discharge, rhinorrhea, sinus pressure, sore throat, trouble swallowing and voice change.   Eyes: Negative for discharge.  Respiratory: Negative for cough, shortness of breath, wheezing and stridor.   Cardiovascular: Negative for chest pain.  Gastrointestinal: Negative for abdominal pain.  Genitourinary: Negative.   Neurological: Negative for dizziness.    Allergies   Review of patient's allergies indicates no known allergies.  Home Medications  No current outpatient prescriptions on file. BP 129/80  Pulse 124  Temp(Src) 99.1 F (37.3 C) (Oral)  Resp 20  Ht 5\' 2"  (1.575 m)  Wt 316 lb 7 oz (143.535 kg)  BMI 57.86 kg/m2  LMP 05/25/2013  Breastfeeding? No Physical Exam  Constitutional: She is oriented to person, place, and time. She appears well-developed and well-nourished.  HENT:  Head: Normocephalic and atraumatic.  Right Ear: Tympanic membrane and ear canal normal.  Nose: Mucosal edema and rhinorrhea present.  Mouth/Throat: Uvula is midline, oropharynx is clear and moist and mucous membranes are normal. No oropharyngeal exudate, posterior oropharyngeal edema, posterior oropharyngeal erythema or tonsillar abscesses.  Left cerumen impaction.  Eyes: Conjunctivae are normal.  Cardiovascular: Normal rate and normal heart sounds.   Pulmonary/Chest: Effort normal. No respiratory distress. She has no wheezes. She has no rales.  Abdominal: Soft. There is no tenderness.  Musculoskeletal: Normal range of motion.  Neurological: She is alert and oriented to person, place, and time.  Skin: Skin is warm and dry. No rash noted.  Psychiatric: She has a normal mood and affect.    ED Course  EAR CERUMEN REMOVAL Date/Time: 05/29/2013 8:45 PM Performed by: Burgess Amor Authorized by: Burgess Amor Consent: Verbal consent obtained. Risks and benefits: risks, benefits and alternatives were discussed Consent given by: patient Patient understanding: patient states understanding of the procedure being performed Patient  identity confirmed: verbally with patient Time out: Immediately prior to procedure a "time out" was called to verify the correct patient, procedure, equipment, support staff and site/side marked as required. Ceruminolytics applied: Ceruminolytics applied prior to the procedure. Location details: left ear Procedure type: curette and  irrigation Patient tolerance: Patient tolerated the procedure well with no immediate complications. Comments: Large amount of cerumen flushed and removed with curette.  Examined post procedure with complete removal of cerumen.  Hearing improved,  Pain gone.   (including critical care time) Labs Review Labs Reviewed - No data to display Imaging Review No results found.  EKG Interpretation   None       MDM   1. Cerumen impaction, left    Prn f/u anticipated.    Burgess Amor, PA-C 05/29/13 2354

## 2013-08-19 ENCOUNTER — Encounter (HOSPITAL_COMMUNITY): Payer: Self-pay | Admitting: Emergency Medicine

## 2013-08-19 ENCOUNTER — Emergency Department (HOSPITAL_COMMUNITY)
Admission: EM | Admit: 2013-08-19 | Discharge: 2013-08-19 | Disposition: A | Discharge status: Home / Self Care | Payer: Medicaid Other | Attending: Emergency Medicine | Admitting: Emergency Medicine

## 2013-08-19 DIAGNOSIS — Z87828 Personal history of other (healed) physical injury and trauma: Secondary | ICD-10-CM | POA: Insufficient documentation

## 2013-08-19 DIAGNOSIS — K089 Disorder of teeth and supporting structures, unspecified: Secondary | ICD-10-CM | POA: Insufficient documentation

## 2013-08-19 DIAGNOSIS — Z9889 Other specified postprocedural states: Secondary | ICD-10-CM | POA: Insufficient documentation

## 2013-08-19 DIAGNOSIS — Z8781 Personal history of (healed) traumatic fracture: Secondary | ICD-10-CM | POA: Insufficient documentation

## 2013-08-19 DIAGNOSIS — K0889 Other specified disorders of teeth and supporting structures: Secondary | ICD-10-CM

## 2013-08-19 MED ORDER — PENICILLIN V POTASSIUM 500 MG PO TABS: 500.0000 mg | ORAL_TABLET | Freq: Four times a day (QID) | ORAL | Status: DC

## 2013-08-19 MED ORDER — TRAMADOL HCL 50 MG PO TABS: 50.0000 mg | ORAL_TABLET | Freq: Four times a day (QID) | ORAL | Status: DC | PRN

## 2013-08-19 NOTE — ED Provider Notes (Signed)
CSN: [id number]     Arrival date & time 08/19/13  1027 History   First MD Initiated Contact with Patient 08/19/13 1134 This chart was scribed for Rolland PorterMark Woodie Degraffenreid, MD by Valera CastleSteven Perry, ED Scribe. This patient was seen in room APA04/APA04 and the patient's care was started at 12:31 PM.      Chief Complaint  Patient presents with  . Jaw Pain    The history is provided by the patient. No language interpreter was used.   HPI Comments: Holly Pope is a 23 y.o. female who presents to the Emergency Department complaining of left, lower jaw pain, with associated left, lower wisdom tooth pain, and swelling, onset yesterday morning. She denies having any work done on her tooth. She states that opening and closing her mouth exacerbates her pain. She reports her jaw sometimes clicks. She denies fever, trouble breathing, trouble swallowing, and any other associated symptoms. She denies any known allergies.   PCP - Default, Provider, MD  Past Medical History  Diagnosis Date  . H/O removal of neck cyst   . Morbidly obese   . Hip dislocation, right 05/06/2012   Past Surgical History  Procedure Laterality Date  . Cystectomy  2002    rmoval from neck   Family History  Problem Relation Age of Onset  . Heart disease Mother   . Hypertension Mother   . Heart disease Father   . Hypertension Father   . Hypertension Brother    History  Substance Use Topics  . Smoking status: Never Smoker   . Smokeless tobacco: Never Used  . Alcohol Use: No   OB History   Grav Para Term Preterm Abortions TAB SAB Ect Mult Living   1 1 1       1      Review of Systems  Constitutional: Negative for fever.  HENT: Positive for dental problem (left, lower jaw pain, with swelling). Negative for trouble swallowing.   Respiratory: Negative for shortness of breath.     Allergies  Review of patient's allergies indicates no known allergies.  Home Medications   Current Outpatient Rx  Name  Route  Sig  Dispense   Refill  . penicillin v potassium (VEETID) 500 MG tablet   Oral   Take 1 tablet (500 mg total) by mouth 4 (four) times daily.   40 tablet   0   . traMADol (ULTRAM) 50 MG tablet   Oral   Take 1 tablet (50 mg total) by mouth every 6 (six) hours as needed.   15 tablet   0     BP 138/101  Pulse 125  Temp(Src) 98.3 F (36.8 C) (Oral)  Resp 16  Ht 5\' 2"  (1.575 m)  Wt 331 lb 6.4 oz (150.322 kg)  BMI 60.60 kg/m2  SpO2 100%  LMP 08/02/2013  Physical Exam  Constitutional: She appears well-developed and well-nourished. No distress.  HENT:  Head: Normocephalic.  Mouth/Throat: Oropharynx is clear and moist.  No external facial swelling. Ttp in area of mandibular molars. Intraoral exam no gingival swelling. Second molar on bottom, mesial cusp is absent. (Old fx)   Eyes: Conjunctivae are normal. Pupils are equal, round, and reactive to light. No scleral icterus.  Neck: Normal range of motion. Neck supple. No thyromegaly present.  No induration or soft tissue swelling.   Lymphadenopathy:    She has no cervical adenopathy.    ED Course  Procedures (including critical care time)  DIAGNOSTIC STUDIES: Oxygen Saturation is 100% on room  air, normal by my interpretation.    COORDINATION OF CARE: 12:37 PM-Discussed treatment plan which includes pain medication with pt at bedside and pt agreed to plan.   Medications - No data to display  MDM   1. Pain, dental    No sign of excessive dental infection or abscess.    I personally performed the services described in this documentation, which was scribed in my presence. The recorded information has been reviewed and is accurate.     Rolland Porter, MD 08/19/13 [phone]

## 2013-08-19 NOTE — Discharge Instructions (Signed)
Dental Pain °Toothache is pain in or around a tooth. It may get worse with chewing or with cold or heat.  °HOME CARE °· Your dentist may use a numbing medicine during treatment. If so, you may need to avoid eating until the medicine wears off. Ask your dentist about this. °· Only take medicine as told by your dentist or doctor. °· Avoid chewing food near the painful tooth until after all treatment is done. Ask your dentist about this. °GET HELP RIGHT AWAY IF:  °· The problem gets worse or new problems appear. °· You have a fever. °· There is redness and puffiness (swelling) of the face, jaw, or neck. °· You cannot open your mouth. °· There is pain in the jaw. °· There is very bad pain that is not helped by medicine. °MAKE SURE YOU:  °· Understand these instructions. °· Will watch your condition. °· Will get help right away if you are not doing well or get worse. °Document Released: 01/11/2008 Document Revised: 10/17/2011 Document Reviewed: 01/11/2008 °[employer]® Patient Information ©2014 [employer]. ° °

## 2013-08-19 NOTE — ED Notes (Signed)
Left jaw pain with recent hx of dental problems to wisdom teeth, per pt. Unable to get in to see dentist. HR of 125.

## 2013-08-30 ENCOUNTER — Encounter (HOSPITAL_COMMUNITY): Payer: Self-pay | Admitting: Emergency Medicine

## 2013-08-30 ENCOUNTER — Emergency Department (HOSPITAL_COMMUNITY)
Admission: EM | Admit: 2013-08-30 | Discharge: 2013-08-30 | Disposition: A | Discharge status: Home / Self Care | Payer: Medicaid Other | Attending: Emergency Medicine | Admitting: Emergency Medicine

## 2013-08-30 DIAGNOSIS — J029 Acute pharyngitis, unspecified: Secondary | ICD-10-CM | POA: Insufficient documentation

## 2013-08-30 DIAGNOSIS — Z9889 Other specified postprocedural states: Secondary | ICD-10-CM | POA: Insufficient documentation

## 2013-08-30 DIAGNOSIS — R110 Nausea: Secondary | ICD-10-CM | POA: Insufficient documentation

## 2013-08-30 DIAGNOSIS — Z87828 Personal history of other (healed) physical injury and trauma: Secondary | ICD-10-CM | POA: Insufficient documentation

## 2013-08-30 DIAGNOSIS — Z3202 Encounter for pregnancy test, result negative: Secondary | ICD-10-CM | POA: Insufficient documentation

## 2013-08-30 DIAGNOSIS — Z792 Long term (current) use of antibiotics: Secondary | ICD-10-CM | POA: Insufficient documentation

## 2013-08-30 LAB — RAPID STREP SCREEN (MED CTR MEBANE ONLY): Streptococcus, Group A Screen (Direct): NEGATIVE

## 2013-08-30 LAB — POCT PREGNANCY, URINE: Preg Test, Ur: NEGATIVE

## 2013-08-30 NOTE — ED Provider Notes (Signed)
Medical screening examination/treatment/procedure(s) were performed by non-physician practitioner and as supervising physician I was immediately available for consultation/collaboration.  EKG Interpretation   None         Anissa Abbs L Sutton Plake, MD 08/30/13 2340 

## 2013-08-30 NOTE — ED Provider Notes (Signed)
CSN: [id number]     Arrival date & time 08/30/13  1519 History   First MD Initiated Contact with Patient 08/30/13 1613     Chief Complaint  Patient presents with  . Sore Throat   (Consider location/radiation/quality/duration/timing/severity/associated sxs/prior Treatment) HPI Comments: Patient also thinks she may be pregnant, is requesting pregnancy test.  Patient is a 23 y.o. female presenting with pharyngitis. The history is provided by the patient. No language interpreter was used.  Sore Throat This is a new problem. The current episode started in the past 7 days. The problem occurs 2 to 4 times per day. The problem has been gradually worsening. Associated symptoms include congestion and nausea. Pertinent negatives include no rash, swollen glands or vomiting. The symptoms are aggravated by swallowing. She has tried nothing for the symptoms.    Past Medical History  Diagnosis Date  . H/O removal of neck cyst   . Morbidly obese   . Hip dislocation, right 05/06/2012   Past Surgical History  Procedure Laterality Date  . Cystectomy  2002    rmoval from neck   Family History  Problem Relation Age of Onset  . Heart disease Mother   . Hypertension Mother   . Heart disease Father   . Hypertension Father   . Hypertension Brother    History  Substance Use Topics  . Smoking status: Never Smoker   . Smokeless tobacco: Never Used  . Alcohol Use: No   OB History   Grav Para Term Preterm Abortions TAB SAB Ect Mult Living   1 1 1       1      Review of Systems  HENT: Positive for congestion.   Gastrointestinal: Positive for nausea. Negative for vomiting.  Skin: Negative for rash.  All other systems reviewed and are negative.    Allergies  Review of patient's allergies indicates no known allergies.  Home Medications   Current Outpatient Rx  Name  Route  Sig  Dispense  Refill  . penicillin v potassium (VEETID) 500 MG tablet   Oral   Take 1 tablet (500 mg total) by mouth 4  (four) times daily.   40 tablet   0   . traMADol (ULTRAM) 50 MG tablet   Oral   Take 1 tablet (50 mg total) by mouth every 6 (six) hours as needed.   15 tablet   0    BP 117/87  Pulse 118  Temp(Src) 98.8 F (37.1 C) (Oral)  Resp 20  Wt 331 lb (150.141 kg)  SpO2 98%  LMP 07/05/2013  Breastfeeding? No Physical Exam  Nursing note and vitals reviewed. Constitutional: She is oriented to person, place, and time. She appears well-developed and well-nourished.  HENT:  Head: Normocephalic.  Mouth/Throat: No oropharyngeal exudate.  Eyes: Pupils are equal, round, and reactive to light.  Neck: Normal range of motion.  Cardiovascular: Normal rate and regular rhythm.   Pulmonary/Chest: Effort normal and breath sounds normal.  Abdominal: Soft. Bowel sounds are normal.  Musculoskeletal: She exhibits no tenderness.  Lymphadenopathy:    She has no cervical adenopathy.  Neurological: She is alert and oriented to person, place, and time.  Skin: Skin is warm and dry.  Psychiatric: She has a normal mood and affect. Her behavior is normal. Judgment and thought content normal.    ED Course  Procedures (including critical care time) Labs Review Labs Reviewed  RAPID STREP SCREEN   Imaging Review No results found.  EKG Interpretation   None  Treatment instructions and return precautions discussed with patient. MDM  Pharyngitis, likely viral.  Strep screen negative.  Culture pending.  Pregnancy test negative.    Jimmye Normanavid John Vernon Ariel, NP 08/30/13 2041

## 2013-08-30 NOTE — Discharge Instructions (Signed)
Pharyngitis °Pharyngitis is redness, pain, and swelling (inflammation) of your pharynx.  °CAUSES  °Pharyngitis is usually caused by infection. Most of the time, these infections are from viruses (viral) and are part of a cold. However, sometimes pharyngitis is caused by bacteria (bacterial). Pharyngitis can also be caused by allergies. Viral pharyngitis may be spread from person to person by coughing, sneezing, and personal items or utensils (cups, forks, spoons, toothbrushes). Bacterial pharyngitis may be spread from person to person by more intimate contact, such as kissing.  °SIGNS AND SYMPTOMS  °Symptoms of pharyngitis include:   °· Sore throat.   °· Tiredness (fatigue).   °· Low-grade fever.   °· Headache. °· Joint pain and muscle aches. °· Skin rashes. °· Swollen lymph nodes. °· Plaque-like film on throat or tonsils (often seen with bacterial pharyngitis). °DIAGNOSIS  °Your health care provider will ask you questions about your illness and your symptoms. Your medical history, along with a physical exam, is often all that is needed to diagnose pharyngitis. Sometimes, a rapid strep test is done. Other lab tests may also be done, depending on the suspected cause.  °TREATMENT  °Viral pharyngitis will usually get better in 3 4 days without the use of medicine. Bacterial pharyngitis is treated with medicines that kill germs (antibiotics).  °HOME CARE INSTRUCTIONS  °· Drink enough water and fluids to keep your urine clear or pale yellow.   °· Only take over-the-counter or prescription medicines as directed by your health care provider:   °· If you are prescribed antibiotics, make sure you finish them even if you start to feel better.   °· Do not take aspirin.   °· Get lots of rest.   °· Gargle with 8 oz of salt water (½ tsp of salt per 1 qt of water) as often as every 1 2 hours to soothe your throat.   °· Throat lozenges (if you are not at risk for choking) or sprays may be used to soothe your throat. °SEEK MEDICAL  CARE IF:  °· You have large, tender lumps in your neck. °· You have a rash. °· You cough up green, yellow-brown, or bloody spit. °SEEK IMMEDIATE MEDICAL CARE IF:  °· Your neck becomes stiff. °· You drool or are unable to swallow liquids. °· You vomit or are unable to keep medicines or liquids down. °· You have severe pain that does not go away with the use of recommended medicines. °· You have trouble breathing (not caused by a stuffy nose). °MAKE SURE YOU:  °· Understand these instructions. °· Will watch your condition. °· Will get help right away if you are not doing well or get worse. °Document Released: 07/25/2005 Document Revised: 05/15/2013 Document Reviewed: 04/01/2013 °[employer]® Patient Information ©2014 [employer]. ° °

## 2013-08-30 NOTE — ED Notes (Signed)
Sore thoat for 2 days, fever.

## 2013-09-01 LAB — CULTURE, GROUP A STREP

## 2013-09-25 ENCOUNTER — Encounter (HOSPITAL_COMMUNITY): Payer: Self-pay | Admitting: Emergency Medicine

## 2013-09-25 ENCOUNTER — Emergency Department (HOSPITAL_COMMUNITY)
Admission: EM | Admit: 2013-09-25 | Discharge: 2013-09-25 | Disposition: A | Discharge status: Home / Self Care | Payer: Medicaid Other | Attending: Emergency Medicine | Admitting: Emergency Medicine

## 2013-09-25 DIAGNOSIS — Z9089 Acquired absence of other organs: Secondary | ICD-10-CM | POA: Insufficient documentation

## 2013-09-25 DIAGNOSIS — G5600 Carpal tunnel syndrome, unspecified upper limb: Secondary | ICD-10-CM | POA: Insufficient documentation

## 2013-09-25 DIAGNOSIS — G5602 Carpal tunnel syndrome, left upper limb: Secondary | ICD-10-CM

## 2013-09-25 DIAGNOSIS — Z87828 Personal history of other (healed) physical injury and trauma: Secondary | ICD-10-CM | POA: Insufficient documentation

## 2013-09-25 LAB — PREGNANCY, URINE: Preg Test, Ur: NEGATIVE

## 2013-09-25 MED ORDER — MELOXICAM 7.5 MG PO TABS: ORAL_TABLET | ORAL | Status: DC

## 2013-09-25 MED ORDER — KETOROLAC TROMETHAMINE 10 MG PO TABS
10.0000 mg | ORAL_TABLET | Freq: Once | ORAL | Status: AC
  Administered 2013-09-25: 10 mg via ORAL
  Filled 2013-09-25: qty 1

## 2013-09-25 NOTE — Discharge Instructions (Signed)
Please use your wrist splint until seen by Dr. Romeo AppleHarrison. Please use Mobic 2 times daily with food. Carpal Tunnel Syndrome The carpal tunnel is an area under the skin of the palm of your hand. Nerves, blood vessels, and strong tissues (tendons) pass through the tunnel. The tunnel can become puffy (swollen). If this happens, a nerve can be pinched in the wrist. This causes carpal tunnel syndrome.  HOME CARE  Take all medicine as told by your doctor.  If you were given a splint, wear it as told. Wear it at night or at times when your doctor told you to.  Rest your wrist from the activity that causes your pain.  Put ice on your wrist after long periods of wrist activity.  Put ice in a plastic bag.  Place a towel between your skin and the bag.  Leave the ice on for 15-20 minutes, 03-04 times a day.  Keep all doctor visits as told. GET HELP RIGHT AWAY IF:  You have new problems you cannot explain.  Your problems get worse and medicine does not help. MAKE SURE YOU:   Understand these instructions.  Will watch your condition.  Will get help right away if you are not doing well or get worse. Document Released: 07/14/2011 Document Revised: 10/17/2011 Document Reviewed: 07/14/2011 [facility] Patient Information 2014 [employer] [place].

## 2013-09-25 NOTE — ED Notes (Addendum)
Pt reporting pain in left wrist for "a couple days".  Unknown injury.  Pt also reporting intermittent nausea for "a couple months."  Reporting last period in November.

## 2013-09-25 NOTE — ED Provider Notes (Signed)
CSN: [id number]     Arrival date & time 09/25/13  1927 History   First MD Initiated Contact with Patient 09/25/13 2142     No chief complaint on file.    (Consider location/radiation/quality/duration/timing/severity/associated sxs/prior Treatment) HPI Comments: Patient presents to the emergency department with complaint of wrist pain. The patient states this started 2-3 days ago. She complains of pain when she makes of his, pins her wrists, or makes almost any motion with her wrist. She denies any known injury. She's not had any operations or procedures involving the wrist. She denies the wrist feeling hot over last 2-3 days. She states however she does a lot of work that involves movement and motion of her wrist. She has tried Tylenol without success.  The history is provided by the patient.    Past Medical History  Diagnosis Date  . H/O removal of neck cyst   . Morbidly obese   . Hip dislocation, right 05/06/2012   Past Surgical History  Procedure Laterality Date  . Cystectomy  2002    rmoval from neck   Family History  Problem Relation Age of Onset  . Heart disease Mother   . Hypertension Mother   . Heart disease Father   . Hypertension Father   . Hypertension Brother    History  Substance Use Topics  . Smoking status: Never Smoker   . Smokeless tobacco: Never Used  . Alcohol Use: No   OB History   Grav Para Term Preterm Abortions TAB SAB Ect Mult Living   1 1 1       1      Review of Systems  Constitutional: Negative for activity change.       All ROS Neg except as noted in HPI  HENT: Negative for nosebleeds.   Eyes: Negative for photophobia and discharge.  Respiratory: Negative for cough, shortness of breath and wheezing.   Cardiovascular: Negative for chest pain and palpitations.  Gastrointestinal: Negative for abdominal pain and blood in stool.  Genitourinary: Negative for dysuria, frequency and hematuria.  Musculoskeletal: Positive for arthralgias. Negative  for back pain and neck pain.       Wrist pain  Skin: Negative.   Neurological: Negative for dizziness, seizures and speech difficulty.  Psychiatric/Behavioral: Negative for hallucinations and confusion.      Allergies  Review of patient's allergies indicates no known allergies.  Home Medications  No current outpatient prescriptions on file. BP 149/97  Pulse 113  Temp(Src) 98 F (36.7 C) (Oral)  Resp 18  Ht 5' 3.5" (1.613 m)  Wt 344 lb (156.037 kg)  BMI 59.97 kg/m2  SpO2 100%  LMP 07/05/2013 Physical Exam  Nursing note and vitals reviewed. Constitutional: She is oriented to person, place, and time. She appears well-developed and well-nourished.  Non-toxic appearance.  Morbid obesity  HENT:  Head: Normocephalic.  Right Ear: Tympanic membrane and external ear normal.  Left Ear: Tympanic membrane and external ear normal.  Eyes: EOM and lids are normal. Pupils are equal, round, and reactive to light.  Neck: Normal range of motion. Neck supple. Carotid bruit is not present.  Cardiovascular: Normal rate, regular rhythm, normal heart sounds, intact distal pulses and normal pulses.   Pulmonary/Chest: Breath sounds normal. No respiratory distress.  Abdominal: Soft. Bowel sounds are normal. There is no tenderness. There is no guarding.  Musculoskeletal: Normal range of motion.  There is pain to the palmar surface of the left wrist. There is a positive Tinel's sign. There is no  atrophy of the thenar eminence. Is full range of motion of the fingers. Full range of motion of the left elbow and shoulder.  Lymphadenopathy:       Head (right side): No submandibular adenopathy present.       Head (left side): No submandibular adenopathy present.    She has no cervical adenopathy.  Neurological: She is alert and oriented to person, place, and time. She has normal strength. No cranial nerve deficit or sensory deficit.  Skin: Skin is warm and dry.  Psychiatric: She has a normal mood and  affect. Her speech is normal.    ED Course  Procedures (including critical care time) Labs Review Labs Reviewed  PREGNANCY, URINE   Imaging Review No results found.  EKG Interpretation   None       MDM   Final diagnoses:  None    *I have reviewed nursing notes, vital signs, and all appropriate lab and imaging results for this patient.** Patient presents to the emergency department with 2-3 days of increasing wrist pain on the left. The patient has a positive Tinel's sign. Question tendinitis versus carpal tunnel versus wrist sprain. Patient placed in a wrist splint. She will be treated with ibuprofen 800 mg 3 times daily. Patient be referred to Dr. Romeo Apple for additional evaluation.   Kathie Dike, PA-C 09/25/13 2206

## 2013-09-29 ENCOUNTER — Encounter (HOSPITAL_COMMUNITY): Payer: Self-pay | Admitting: Emergency Medicine

## 2013-09-29 ENCOUNTER — Emergency Department (HOSPITAL_COMMUNITY)
Admission: EM | Admit: 2013-09-29 | Discharge: 2013-09-29 | Disposition: A | Discharge status: Home / Self Care | Payer: Medicaid Other | Attending: Emergency Medicine | Admitting: Emergency Medicine

## 2013-09-29 DIAGNOSIS — R110 Nausea: Secondary | ICD-10-CM | POA: Insufficient documentation

## 2013-09-29 DIAGNOSIS — J020 Streptococcal pharyngitis: Secondary | ICD-10-CM | POA: Insufficient documentation

## 2013-09-29 DIAGNOSIS — Z87828 Personal history of other (healed) physical injury and trauma: Secondary | ICD-10-CM | POA: Insufficient documentation

## 2013-09-29 LAB — RAPID STREP SCREEN (MED CTR MEBANE ONLY): Streptococcus, Group A Screen (Direct): POSITIVE — AB

## 2013-09-29 MED ORDER — PENICILLIN V POTASSIUM 500 MG PO TABS: 500.0000 mg | ORAL_TABLET | Freq: Three times a day (TID) | ORAL | Status: DC

## 2013-09-29 MED ORDER — PENICILLIN V POTASSIUM 250 MG PO TABS
500.0000 mg | ORAL_TABLET | Freq: Once | ORAL | Status: AC
  Administered 2013-09-29: 500 mg via ORAL
  Filled 2013-09-29: qty 2

## 2013-09-29 MED ORDER — HYDROCODONE-ACETAMINOPHEN 5-325 MG PO TABS: 1.0000 | ORAL_TABLET | ORAL | Status: DC | PRN

## 2013-09-29 NOTE — ED Provider Notes (Signed)
CSN: [id number]     Arrival date & time 09/29/13  1632 History   [facility] by Ladona Ridgelaylor Day, ED scribe. This patient was seen in room APFT21/APFT21 and the patient's care was started at 1632.  Chief Complaint  Patient presents with  . Sore Throat   The history is provided by the patient. No language interpreter was used.   HPI Comments: Holly Pope is a 23 y.o. female who presents to the Emergency Department complaining of a sore throat, onset x2 days ago. She reports associated subjective fever, chills and nausea. She reports previous similar episodes but denies ever having strept throat. She reports that she is also congested and productive of yellow colored phlegm as well having sinus pressure for the past x2 days. She denies emesis episodes, ear aches diarrhea, abdominal pain and no urinary sx such as frequency or dysuria.   Past Medical History  Diagnosis Date  . H/O removal of neck cyst   . Morbidly obese   . Hip dislocation, right 05/06/2012   Past Surgical History  Procedure Laterality Date  . Cystectomy  2002    rmoval from neck   Family History  Problem Relation Age of Onset  . Heart disease Mother   . Hypertension Mother   . Heart disease Father   . Hypertension Father   . Hypertension Brother    History  Substance Use Topics  . Smoking status: Never Smoker   . Smokeless tobacco: Never Used  . Alcohol Use: No   OB History   Grav Para Term Preterm Abortions TAB SAB Ect Mult Living   1 1 1       1      Review of Systems  Constitutional: Positive for fever. Negative for chills.  HENT: Positive for congestion, sinus pressure and sore throat.   Respiratory: Positive for cough. Negative for shortness of breath.   Cardiovascular: Negative for chest pain.  Gastrointestinal: Positive for nausea. Negative for vomiting, abdominal pain and diarrhea.  Genitourinary: Negative for dysuria and frequency.  Musculoskeletal: Negative for back  pain.  Skin: Negative for color change.  All other systems reviewed and are negative.   Allergies  Review of patient's allergies indicates no known allergies.  Home Medications   Current Outpatient Rx  Name  Route  Sig  Dispense  Refill  . meloxicam (MOBIC) 7.5 MG tablet      1 po bid with food   14 tablet   0    Triage Vitals: BP 144/93  Pulse 126  Temp(Src) 97.6 F (36.4 C) (Oral)  Resp 24  Ht 5\' 2"  (1.575 m)  Wt 344 lb (156.037 kg)  BMI 62.90 kg/m2  SpO2 97%  LMP 07/05/2013  Physical Exam  Nursing note and vitals reviewed. Constitutional: She is oriented to person, place, and time. She appears well-developed and well-nourished. No distress.  HENT:  Head: Normocephalic and atraumatic.  Left TM dulness and w/fluid behind it.  Right TM obstructed visualization w/cerumen. Pharyngeal erythema. Uvula is midline.   Eyes: Conjunctivae are normal. Pupils are equal, round, and reactive to light. Right eye exhibits no discharge. Left eye exhibits no discharge.  Neck: Normal range of motion.  Mild bilateral anterior cervical lymph nodes.   Cardiovascular: Normal rate, regular rhythm and normal heart sounds.   No murmur heard. Pulmonary/Chest: Effort normal and breath sounds normal. No respiratory distress. She has no wheezes. She has no rales.  Musculoskeletal: Normal range of motion. She  exhibits no edema.  Neurological: She is alert and oriented to person, place, and time.  Skin: Skin is warm and dry.  Psychiatric: She has a normal mood and affect. Thought content normal.   ED Course  Procedures (including critical care time) DIAGNOSTIC STUDIES: Oxygen Saturation is 97% on room air, normal by my interpretation.    COORDINATION OF CARE: At 454 PM Discussed treatment plan with patient which includes rapid strept screen. Patient agrees.   5:20 PM Patient is positive for strept. Will d/c patient w/rx for PCN and pain medicine.  I have reviewed this patient's vital  signs, nurses notes, appropriate labs and discussed findings with the patient and plan of care. She voices understanding.   Results for orders placed during the hospital encounter of 09/29/13 (from the past 24 hour(s))  RAPID STREP SCREEN     Status: Abnormal   Collection Time    09/29/13  4:52 PM      Result Value Ref Range   Streptococcus, Group A Screen (Direct) POSITIVE (*) NEGATIVE     Medication List    TAKE these medications       HYDROcodone-acetaminophen 5-325 MG per tablet  Commonly known as:  NORCO/VICODIN  Take 1 tablet by mouth every 4 (four) hours as needed.     penicillin v potassium 500 MG tablet  Commonly known as:  VEETID  Take 1 tablet (500 mg total) by mouth 3 (three) times daily.      ASK your doctor about these medications       meloxicam 7.5 MG tablet  Commonly known as:  MOBIC  1 po bid with food         I personally performed the services described in this documentation, which was scribed in my presence. The recorded information has been reviewed and is accurate.      [address] [place], [place] 09/30/13 [phone]

## 2013-09-29 NOTE — ED Notes (Signed)
Patient complaining of sore throat x 2 days. 

## 2013-09-29 NOTE — ED Provider Notes (Signed)
Medical screening examination/treatment/procedure(s) were performed by non-physician practitioner and as supervising physician I was immediately available for consultation/collaboration.  EKG Interpretation   None        Alayla Dethlefs, MD 09/29/13 2046 

## 2013-09-30 NOTE — ED Provider Notes (Signed)
Medical screening examination/treatment/procedure(s) were performed by non-physician practitioner and as supervising physician I was immediately available for consultation/collaboration.  Semaya Vida M Rosita Guzzetta, MD 09/30/13 1342 

## 2013-11-06 NOTE — H&P (Signed)
HISTORY AND PHYSICAL  Holly Pope is a 23 y.o. female patient with CC: painful wisdom teeth  No diagnosis found.  Past Medical History  Diagnosis Date  . H/O removal of neck cyst   . Morbidly obese   . Hip dislocation, right 05/06/2012    No current facility-administered medications for this encounter.   Current Outpatient Prescriptions  Medication Sig Dispense Refill  . benazepril (LOTENSIN) 5 MG tablet Take 5 mg by mouth daily.      . diclofenac (VOLTAREN) 75 MG EC tablet Take 75 mg by mouth 2 (two) times daily.      . metFORMIN (GLUCOPHAGE) 1000 MG tablet Take 1,000 mg by mouth 2 (two) times daily with a meal.       No Known Allergies Active Problems:   * No active hospital problems. *  Vitals: not currently breastfeeding. Lab results:No results found for this or any previous visit (from the past 24 hour(s)). Radiology Results: No results found. General appearance: alert, cooperative, no distress and morbidly obese Head: Normocephalic, without obvious abnormality, atraumatic Eyes: negative Nose: Nares normal. Septum midline. Mucosa normal. No drainage or sinus tenderness. Throat: lips, mucosa, and tongue normal; teeth and gums normal and Impacted teeth #'s 1, 16, 17, and 32 Neck: no adenopathy, supple, symmetrical, trachea midline and thyroid not enlarged, symmetric, no tenderness/mass/nodules Resp: clear to auscultation bilaterally Cardio: regular rate and rhythm, S1, S2 normal, no murmur, click, rub or gallop  Assessment: Impacted teeth 1, 16, 17, 32.  Plan: Extract teeth 1, 16, 17, 32. General anesthesia. Day surgery.   Georgia LopesJENSEN,Kessler Solly M 11/06/2013

## 2013-11-06 NOTE — Pre-Procedure Instructions (Signed)
Holly Pope  11/06/2013   Your procedure is scheduled on: Monday, November 11, 2013 at 7:30 AM  [facility]use Main Entrance "A'') at 5:30 AM.  Call this number if you have problems the morning of surgery: [phone]   Remember:   Do not eat food or drink liquids after midnight.   Take these medicines the morning of surgery with A SIP OF WATER: NONE Stop taking Aspirin  and herbal medications. Do not take any NSAIDs ie: Ibuprofen, Advil, Naproxen or any medication containing Aspirin ( diclofenac (VOLTAREN)   Do not wear jewelry, make-up or nail polish.  Do not wear lotions, powders, or perfumes. You may wear deodorant.  Do not shave 48 hours prior to surgery.   Do not bring valuables to the hospital.  [facility] is not responsible for any belongings or valuables.               Contacts, dentures or bridgework may not be worn into surgery.  Leave suitcase in the car. After surgery it may be brought to your room.  For patients admitted to the hospital, discharge time is determined by your treatment team.               Patients discharged the day of surgery will not be allowed to drive home.  Name and phone number of your driver:   Special Instructions:  Special Instructions:Special Instructions: [facility]  Before surgery, you can play an important role.  Because skin is not sterile, your skin needs to be as free of germs as possible.  You can reduce the number of germs on you skin by washing with CHG (chlorahexidine gluconate) soap before surgery.  CHG is an antiseptic cleaner which kills germs and bonds with the skin to continue killing germs even after washing.  Please DO NOT use if you have an allergy to CHG or antibacterial soaps.  If your skin becomes reddened/irritated stop using the CHG and inform your nurse when you arrive at Short Stay.  Do not shave (including legs and underarms) for at least 48 hours prior to the first CHG  shower.  You may shave your face.  Please follow these instructions carefully:   1.  Shower with CHG Soap the night before surgery and the morning of Surgery.  2.  If you choose to wash your hair, wash your hair first as usual with your normal shampoo.  3.  After you shampoo, rinse your hair and body thoroughly to remove the Shampoo.  4.  Use CHG as you would any other liquid soap.  You can apply chg directly  to the skin and wash gently with scrungie or a clean washcloth.  5.  Apply the CHG Soap to your body ONLY FROM THE NECK DOWN.  Do not use on open wounds or open sores.  Avoid contact with your eyes, ears, mouth and genitals (private parts).  Wash genitals (private parts) with your normal soap.  6.  Wash thoroughly, paying special attention to the area where your surgery will be performed.  7.  Thoroughly rinse your body with warm water from the neck down.  8.  DO NOT shower/wash with your normal soap after using and rinsing off the CHG Soap.  9.  Pat yourself dry with a clean towel.            10.  Wear clean pajamas.  11.  Place clean sheets on your bed the night of your first shower and do not sleep with pets.  Day of Surgery  Do not apply any lotions the morning of surgery.  Please wear clean clothes to the hospital/surgery center.   Please read over the following fact sheets that you were given: Pain Booklet, Coughing and Deep Breathing and Surgical Site Infection Prevention

## 2013-11-07 ENCOUNTER — Emergency Department (HOSPITAL_COMMUNITY)
Admission: EM | Admit: 2013-11-07 | Discharge: 2013-11-07 | Disposition: A | Discharge status: Home / Self Care | Payer: Medicaid Other | Attending: Emergency Medicine | Admitting: Emergency Medicine

## 2013-11-07 ENCOUNTER — Encounter (HOSPITAL_COMMUNITY)
Admission: RE | Admit: 2013-11-07 | Discharge: 2013-11-07 | Disposition: A | Discharge status: Home / Self Care | Payer: Medicaid Other | Source: Ambulatory Visit | Attending: Oral Surgery | Admitting: Oral Surgery

## 2013-11-07 ENCOUNTER — Encounter (HOSPITAL_COMMUNITY): Payer: Self-pay

## 2013-11-07 ENCOUNTER — Ambulatory Visit (HOSPITAL_COMMUNITY)
Admission: RE | Admit: 2013-11-07 | Discharge: 2013-11-07 | Disposition: A | Discharge status: Home / Self Care | Payer: Medicaid Other | Source: Ambulatory Visit | Attending: Anesthesiology | Admitting: Anesthesiology

## 2013-11-07 DIAGNOSIS — Z01810 Encounter for preprocedural cardiovascular examination: Secondary | ICD-10-CM | POA: Insufficient documentation

## 2013-11-07 DIAGNOSIS — Z01818 Encounter for other preprocedural examination: Secondary | ICD-10-CM | POA: Insufficient documentation

## 2013-11-07 DIAGNOSIS — Z01812 Encounter for preprocedural laboratory examination: Secondary | ICD-10-CM | POA: Insufficient documentation

## 2013-11-07 HISTORY — DX: Essential (primary) hypertension: I10

## 2013-11-07 HISTORY — DX: Type 2 diabetes mellitus without complications: E11.9

## 2013-11-07 LAB — CBC
HCT: 37.1 % (ref 36.0–46.0)
Hemoglobin: 11.9 g/dL — ABNORMAL LOW (ref 12.0–15.0)
MCH: 26 pg (ref 26.0–34.0)
MCHC: 32.1 g/dL (ref 30.0–36.0)
MCV: 81 fL (ref 78.0–100.0)
Platelets: 395 10*3/uL (ref 150–400)
RBC: 4.58 MIL/uL (ref 3.87–5.11)
RDW: 16.5 % — ABNORMAL HIGH (ref 11.5–15.5)
WBC: 10.2 10*3/uL (ref 4.0–10.5)

## 2013-11-07 LAB — BASIC METABOLIC PANEL
BUN: 12 mg/dL (ref 6–23)
CO2: 23 mEq/L (ref 19–32)
Calcium: 9.7 mg/dL (ref 8.4–10.5)
Chloride: 101 mEq/L (ref 96–112)
Creatinine, Ser: 0.61 mg/dL (ref 0.50–1.10)
GFR calc Af Amer: >90 mL/min (ref >90)
GFR calc non Af Amer: >90 mL/min (ref >90)
Glucose, Bld: 152 mg/dL — ABNORMAL HIGH (ref 70–99)
Potassium: 4 mEq/L (ref 3.7–5.3)
Sodium: 140 mEq/L (ref 137–147)

## 2013-11-07 LAB — HCG, SERUM, QUALITATIVE: Preg, Serum: NEGATIVE

## 2013-11-10 MED ORDER — CEFAZOLIN SODIUM 10 G IJ SOLR
3.0000 g | INTRAMUSCULAR | Status: DC
  Filled 2013-11-10: qty 3000

## 2013-11-11 ENCOUNTER — Encounter (HOSPITAL_COMMUNITY): Payer: Medicaid Other | Admitting: Anesthesiology

## 2013-11-11 ENCOUNTER — Encounter (HOSPITAL_COMMUNITY): Payer: Self-pay | Admitting: Anesthesiology

## 2013-11-11 ENCOUNTER — Encounter (HOSPITAL_COMMUNITY)
Admission: RE | Disposition: A | Discharge status: Home / Self Care | Payer: Self-pay | Source: Ambulatory Visit | Attending: Oral Surgery

## 2013-11-11 ENCOUNTER — Ambulatory Visit (HOSPITAL_COMMUNITY)
Admission: RE | Admit: 2013-11-11 | Discharge: 2013-11-11 | Disposition: A | Discharge status: Home / Self Care | Payer: Medicaid Other | Source: Ambulatory Visit | Attending: Oral Surgery | Admitting: Oral Surgery

## 2013-11-11 ENCOUNTER — Ambulatory Visit (HOSPITAL_COMMUNITY): Payer: Medicaid Other | Admitting: Anesthesiology

## 2013-11-11 DIAGNOSIS — I10 Essential (primary) hypertension: Secondary | ICD-10-CM | POA: Insufficient documentation

## 2013-11-11 DIAGNOSIS — K029 Dental caries, unspecified: Secondary | ICD-10-CM

## 2013-11-11 DIAGNOSIS — K011 Impacted teeth: Secondary | ICD-10-CM

## 2013-11-11 DIAGNOSIS — K006 Disturbances in tooth eruption: Secondary | ICD-10-CM | POA: Insufficient documentation

## 2013-11-11 DIAGNOSIS — S73004A Unspecified dislocation of right hip, initial encounter: Secondary | ICD-10-CM

## 2013-11-11 DIAGNOSIS — E119 Type 2 diabetes mellitus without complications: Secondary | ICD-10-CM | POA: Insufficient documentation

## 2013-11-11 HISTORY — PX: TOOTH EXTRACTION: SHX859

## 2013-11-11 LAB — GLUCOSE, CAPILLARY
Glucose-Capillary: 104 mg/dL — ABNORMAL HIGH (ref 70–99)
Glucose-Capillary: 146 mg/dL — ABNORMAL HIGH (ref 70–99)

## 2013-11-11 SURGERY — EXTRACTION, TOOTH, MOLAR
Anesthesia: General | Site: Mouth

## 2013-11-11 MED ORDER — SUCCINYLCHOLINE CHLORIDE 20 MG/ML IJ SOLN
INTRAMUSCULAR | Status: DC | PRN
Start: 2013-11-11 — End: 2013-11-11
  Administered 2013-11-11: 140 mg via INTRAVENOUS

## 2013-11-11 MED ORDER — MIDAZOLAM HCL 2 MG/2ML IJ SOLN
INTRAMUSCULAR | Status: AC
  Filled 2013-11-11: qty 2

## 2013-11-11 MED ORDER — LACTATED RINGERS IV SOLN
INTRAVENOUS | Status: DC | PRN
  Administered 2013-11-11: 07:00:00 via INTRAVENOUS

## 2013-11-11 MED ORDER — OXYCODONE-ACETAMINOPHEN 5-325 MG PO TABS: 1.0000 | ORAL_TABLET | ORAL | Status: DC | PRN

## 2013-11-11 MED ORDER — FENTANYL CITRATE 0.05 MG/ML IJ SOLN
INTRAMUSCULAR | Status: AC
  Filled 2013-11-11: qty 5

## 2013-11-11 MED ORDER — OXYMETAZOLINE HCL 0.05 % NA SOLN
NASAL | Status: AC
  Filled 2013-11-11: qty 15

## 2013-11-11 MED ORDER — OXYCODONE HCL 5 MG PO TABS
ORAL_TABLET | ORAL | Status: AC
  Administered 2013-11-11: 5 mg via ORAL
  Filled 2013-11-11: qty 1

## 2013-11-11 MED ORDER — OXYCODONE HCL 5 MG/5ML PO SOLN: 5.0000 mg | Freq: Once | ORAL | Status: AC | PRN

## 2013-11-11 MED ORDER — FENTANYL CITRATE 0.05 MG/ML IJ SOLN
INTRAMUSCULAR | Status: AC
  Administered 2013-11-11: 25 ug via INTRAVENOUS
  Filled 2013-11-11: qty 2

## 2013-11-11 MED ORDER — FENTANYL CITRATE 0.05 MG/ML IJ SOLN
25.0000 ug | INTRAMUSCULAR | Status: DC | PRN
  Administered 2013-11-11 (×2): 25 ug via INTRAVENOUS

## 2013-11-11 MED ORDER — OXYCODONE HCL 5 MG PO TABS
5.0000 mg | ORAL_TABLET | Freq: Once | ORAL | Status: AC | PRN
  Administered 2013-11-11: 5 mg via ORAL

## 2013-11-11 MED ORDER — ROCURONIUM BROMIDE 50 MG/5ML IV SOLN
INTRAVENOUS | Status: AC
  Filled 2013-11-11: qty 1

## 2013-11-11 MED ORDER — LIDOCAINE-EPINEPHRINE 2 %-1:100000 IJ SOLN
INTRAMUSCULAR | Status: AC
  Filled 2013-11-11: qty 1

## 2013-11-11 MED ORDER — PROPOFOL 10 MG/ML IV BOLUS
INTRAVENOUS | Status: AC
  Filled 2013-11-11: qty 20

## 2013-11-11 MED ORDER — ONDANSETRON HCL 4 MG/2ML IJ SOLN: 4.0000 mg | Freq: Four times a day (QID) | INTRAMUSCULAR | Status: DC | PRN

## 2013-11-11 MED ORDER — LIDOCAINE HCL (CARDIAC) 20 MG/ML IV SOLN
INTRAVENOUS | Status: AC
  Filled 2013-11-11: qty 5

## 2013-11-11 MED ORDER — FENTANYL CITRATE 0.05 MG/ML IJ SOLN
INTRAMUSCULAR | Status: DC | PRN
  Administered 2013-11-11 (×5): 50 ug via INTRAVENOUS

## 2013-11-11 MED ORDER — LIDOCAINE-EPINEPHRINE 2 %-1:100000 IJ SOLN
INTRAMUSCULAR | Status: DC | PRN
  Administered 2013-11-11: 20 mL

## 2013-11-11 MED ORDER — MIDAZOLAM HCL 5 MG/5ML IJ SOLN
INTRAMUSCULAR | Status: DC | PRN
  Administered 2013-11-11: 2 mg via INTRAVENOUS

## 2013-11-11 MED ORDER — PROPOFOL 10 MG/ML IV BOLUS
INTRAVENOUS | Status: DC | PRN
  Administered 2013-11-11: 200 mg via INTRAVENOUS

## 2013-11-11 MED ORDER — SUCCINYLCHOLINE CHLORIDE 20 MG/ML IJ SOLN
INTRAMUSCULAR | Status: AC
  Filled 2013-11-11: qty 1

## 2013-11-11 MED ORDER — LIDOCAINE HCL (CARDIAC) 20 MG/ML IV SOLN
INTRAVENOUS | Status: DC | PRN
  Administered 2013-11-11: 80 mg via INTRAVENOUS

## 2013-11-11 MED ORDER — ONDANSETRON HCL 4 MG/2ML IJ SOLN
INTRAMUSCULAR | Status: AC
  Filled 2013-11-11: qty 2

## 2013-11-11 MED ORDER — ONDANSETRON HCL 4 MG/2ML IJ SOLN
INTRAMUSCULAR | Status: DC | PRN
  Administered 2013-11-11: 4 mg via INTRAVENOUS

## 2013-11-11 MED ORDER — SODIUM CHLORIDE 0.9 % IR SOLN
Status: DC | PRN
  Administered 2013-11-11: 1000 mL

## 2013-11-11 MED ORDER — OXYMETAZOLINE HCL 0.05 % NA SOLN
NASAL | Status: DC | PRN
  Administered 2013-11-11: 1 via NASAL

## 2013-11-11 SURGICAL SUPPLY — 31 items
BUR CROSS CUT (BURR)
BUR CROSS CUT FISSURE 1.6 (BURR) ×2 IMPLANT
BUR CROSS CUT FISSURE 1.6MM (BURR) ×1
BUR SRG MED 1.6XXCUT FSSR (BURR) IMPLANT
BURR SRG MED 1.6XXCUT FSSR (BURR)
CANISTER SUCTION 2500CC (MISCELLANEOUS) ×3 IMPLANT
COVER SURGICAL LIGHT HANDLE (MISCELLANEOUS) ×3 IMPLANT
GAUZE PACKING FOLDED 2  STR (GAUZE/BANDAGES/DRESSINGS) ×2
GAUZE PACKING FOLDED 2 STR (GAUZE/BANDAGES/DRESSINGS) ×1 IMPLANT
GAUZE SPONGE 4X4 16PLY XRAY LF (GAUZE/BANDAGES/DRESSINGS) IMPLANT
GLOVE BIO SURGEON STRL SZ 6.5 (GLOVE) ×2 IMPLANT
GLOVE BIO SURGEON STRL SZ7.5 (GLOVE) ×3 IMPLANT
GLOVE BIO SURGEONS STRL SZ 6.5 (GLOVE) ×1
GLOVE BIOGEL PI IND STRL 7.0 (GLOVE) ×1 IMPLANT
GLOVE BIOGEL PI INDICATOR 7.0 (GLOVE) ×2
GOWN STRL REUS W/ TWL LRG LVL3 (GOWN DISPOSABLE) ×1 IMPLANT
GOWN STRL REUS W/ TWL XL LVL3 (GOWN DISPOSABLE) ×1 IMPLANT
GOWN STRL REUS W/TWL LRG LVL3 (GOWN DISPOSABLE) ×3
GOWN STRL REUS W/TWL XL LVL3 (GOWN DISPOSABLE) ×3
KIT BASIN OR (CUSTOM PROCEDURE TRAY) ×3 IMPLANT
KIT ROOM TURNOVER OR (KITS) ×3 IMPLANT
NEEDLE 22X1 1/2 (OR ONLY) (NEEDLE) ×3 IMPLANT
NS IRRIG 1000ML POUR BTL (IV SOLUTION) ×3 IMPLANT
PAD ARMBOARD 7.5X6 YLW CONV (MISCELLANEOUS) ×6 IMPLANT
SUT CHROMIC 3 0 PS 2 (SUTURE) ×4 IMPLANT
TOWEL OR 17X26 10 PK STRL BLUE (TOWEL DISPOSABLE) ×3 IMPLANT
TRAY ENT MC OR (CUSTOM PROCEDURE TRAY) ×3 IMPLANT
TUBE CONNECTING 12'X1/4 (SUCTIONS)
TUBE CONNECTING 12X1/4 (SUCTIONS) IMPLANT
TUBING IRRIGATION (MISCELLANEOUS) ×2 IMPLANT
YANKAUER SUCT BULB TIP NO VENT (SUCTIONS) ×3 IMPLANT

## 2013-11-11 NOTE — Anesthesia Postprocedure Evaluation (Signed)
Anesthesia Post Note  Patient: Holly Pope  Procedure(s) Performed: Procedure(s) (LRB): EXTRACT 3RD MOLARS PLUS ONE (N/A)  Anesthesia type: General  Patient location: PACU  Post pain: Pain level controlled and Adequate analgesia  Post assessment: Post-op Vital signs reviewed, Patient's Cardiovascular Status Stable, Respiratory Function Stable, Patent Airway and Pain level controlled  Last Vitals:  Filed Vitals:   11/11/13 0854  BP: 121/79  Pulse: 116  Temp:   Resp: 18    Post vital signs: Reviewed and stable  Level of consciousness: awake, alert  and oriented  Complications: No apparent anesthesia complications

## 2013-11-11 NOTE — Op Note (Signed)
11/11/2013  7:52 AM  PATIENT:  Holly Pope  23 y.o. female  PRE-OPERATIVE DIAGNOSIS:  NON RESTORABLE TEETH # 1, 18 , IMPACTED TEETH # 16, 17, 32  POST-OPERATIVE DIAGNOSIS:  SAME  PROCEDURE:  Procedure(s): EXTRACT 3RD MOLARS PLUS 18  SURGEON:  Surgeon(s): Georgia LopesScott M Kataleyah Carducci, DDS  ANESTHESIA:   local and general  EBL:  minimal  DRAINS: none   SPECIMEN:  No Specimen  COUNTS:  YES  PLAN OF CARE: Discharge to home after PACU  PATIENT DISPOSITION:  PACU - hemodynamically stable.   PROCEDURE DETAILS: Dictation # [id number]  Georgia LopesScott M. Shailah Gibbins, DMD 11/11/2013 7:52 AM

## 2013-11-11 NOTE — Anesthesia Preprocedure Evaluation (Addendum)
Anesthesia Evaluation  Patient identified by MRN, date of birth, ID band Patient awake    Reviewed: Allergy & Precautions, H&P , NPO status , Patient's Chart, lab work & pertinent test results  History of Anesthesia Complications Negative for: history of anesthetic complications  Airway Mallampati: III TM Distance: >3 FB Neck ROM: full    Dental  (+) Dental Advisory Given, Teeth Intact   Pulmonary neg pulmonary ROS,          Cardiovascular hypertension, Pt. on medications     Neuro/Psych negative neurological ROS  negative psych ROS   GI/Hepatic negative GI ROS, Neg liver ROS,   Endo/Other  diabetes, Type 2, Oral Hypoglycemic AgentsMorbid obesity  Renal/GU negative Renal ROS     Musculoskeletal negative musculoskeletal ROS (+)   Abdominal   Peds  Hematology negative hematology ROS (+)   Anesthesia Other Findings   Reproductive/Obstetrics negative OB ROS                         Anesthesia Physical Anesthesia Plan  ASA: II  Anesthesia Plan: General   Post-op Pain Management:    Induction: Intravenous  Airway Management Planned: Nasal ETT  Additional Equipment:   Intra-op Plan:   Post-operative Plan: Extubation in OR  Informed Consent: I have reviewed the patients History and Physical, chart, labs and discussed the procedure including the risks, benefits and alternatives for the proposed anesthesia with the patient or authorized representative who has indicated his/her understanding and acceptance.     Plan Discussed with: CRNA, Anesthesiologist and Surgeon  Anesthesia Plan Comments:         Anesthesia Quick Evaluation

## 2013-11-11 NOTE — H&P (Signed)
H&P documentation  -History and Physical Reviewed  -Patient has been re-examined  -No change in the plan of care  Holly Pope M  

## 2013-11-11 NOTE — Transfer of Care (Signed)
Immediate Anesthesia Transfer of Care Note  Patient: Holly Pope  Procedure(s) Performed: Procedure(s): EXTRACT 3RD MOLARS PLUS ONE (N/A)  Patient Location: PACU  Anesthesia Type:General  Level of Consciousness: awake, alert  and oriented  Airway & Oxygen Therapy: Patient Spontanous Breathing and Patient connected to nasal cannula oxygen  Post-op Assessment: Report given to PACU RN and Post -op Vital signs reviewed and stable  Post vital signs: Reviewed and stable  Complications: No apparent anesthesia complications

## 2013-11-11 NOTE — Op Note (Signed)
NAME:  Holly Pope, Holly Pope             ACCOUNT NO.:  [id number]  MEDICAL RECORD NO.:  [id number]  LOCATION:  [facility]                         FACILITY:  [facility]  PHYSICIAN:  Georgia LopesScott M. Elektra Wartman, M.D.  DATE OF BIRTH:  November 01, 1990  DATE OF PROCEDURE:  11/11/2013 DATE OF DISCHARGE:                              OPERATIVE REPORT   PREOPERATIVE DIAGNOSIS:  Nonrestorable teeth #1, and 18, impacted teeth #16, 17, 32.  POSTOPERATIVE DIAGNOSIS:  Nonrestorable teeth #1, and 18, impacted teeth #16, 17, 32.  PROCEDURES:  Extraction of teeth #1, 16, 17, 18, 32.  SURGEON:  Georgia LopesScott M. Kahlia Lagunes, MD  ANESTHESIA:  General, Dr. Chaney MallingHodierne attending.  PROCEDURE:  The patient was taken to the operating room, placed on the table in supine position.  General anesthesia was administered intravenously and an oral endotracheal tube was placed and marked.  The eyes were protected and the patient was draped for the procedure.  Time- out was performed.  The posterior pharynx was suctioned.  A throat pack was placed.  A 2% lidocaine with 1:100,000 epinephrine was infiltrated in an inferior alveolar block on the right and left side and a buccal and palatal infiltration on the posterior maxilla, total of 13 mL was utilized.  A bite block was placed in the right side of the mouth and a sweetheart retractor was used to retract the tongue.  A 15 blade was used to make an incision around teeth #17 and 18 in the mandible and around tooth #16 in the maxilla.  The periosteum was reflected with the periosteal elevator.  Bone was removed in mandible around teeth #17 and 18, and then the teeth were elevated with the 301 elevator and removed with a dental forceps.  Copious amount of granulation tissue was excavated from the socket of tooth #18.  Then, these 3 teeth were irrigated in the sockets and then closed with 3-0 chromic.  The bite block and endotracheal tube and sweetheart retractor were repositioned to the other side of the mouth,  and a 15 blade was used to make a full- thickness incision overlying tooth #32.  In a routine fashion, an incision was made overlying tooth #1 as well.  The periosteum was reflected.  These teeth were exposed.  Bone was removed in the mandible using Stryker handpiece and a fissure bur under irrigation.  Then, the upper and lower teeth on the right side were elevated with a 301 elevator and removed from the mouth.  The sockets were then curetted, irrigated, and closed with 3-0 chromic.  The oral cavity was then inspected and irrigated and suctioned.  Throat pack was removed.  The patient was awakened, taken to recovery room, breathing spontaneously in good condition.  ESTIMATED BLOOD LOSS:  Minimal.  COMPLICATIONS:  None.  SPECIMENS:  None.     Georgia LopesScott M. Abel Ra, M.D.     SMJ/MEDQ  D:  11/11/2013  T:  11/11/2013  Job:  [id number]

## 2013-11-13 ENCOUNTER — Encounter (HOSPITAL_COMMUNITY): Payer: Self-pay | Admitting: Oral Surgery

## 2013-11-30 ENCOUNTER — Emergency Department (HOSPITAL_COMMUNITY): Payer: Medicaid Other

## 2013-11-30 ENCOUNTER — Emergency Department (HOSPITAL_COMMUNITY)
Admission: EM | Admit: 2013-11-30 | Discharge: 2013-12-01 | Disposition: A | Discharge status: Home / Self Care | Payer: Medicaid Other | Attending: Emergency Medicine | Admitting: Emergency Medicine

## 2013-11-30 ENCOUNTER — Encounter (HOSPITAL_COMMUNITY): Payer: Self-pay | Admitting: Emergency Medicine

## 2013-11-30 DIAGNOSIS — Z79899 Other long term (current) drug therapy: Secondary | ICD-10-CM | POA: Insufficient documentation

## 2013-11-30 DIAGNOSIS — R109 Unspecified abdominal pain: Secondary | ICD-10-CM | POA: Insufficient documentation

## 2013-11-30 DIAGNOSIS — Z3202 Encounter for pregnancy test, result negative: Secondary | ICD-10-CM | POA: Insufficient documentation

## 2013-11-30 DIAGNOSIS — R52 Pain, unspecified: Secondary | ICD-10-CM | POA: Insufficient documentation

## 2013-11-30 DIAGNOSIS — R110 Nausea: Secondary | ICD-10-CM | POA: Insufficient documentation

## 2013-11-30 DIAGNOSIS — I10 Essential (primary) hypertension: Secondary | ICD-10-CM | POA: Insufficient documentation

## 2013-11-30 DIAGNOSIS — E119 Type 2 diabetes mellitus without complications: Secondary | ICD-10-CM | POA: Insufficient documentation

## 2013-11-30 LAB — URINALYSIS, ROUTINE W REFLEX MICROSCOPIC
Bilirubin Urine: NEGATIVE
Glucose, UA: NEGATIVE mg/dL
Hgb urine dipstick: NEGATIVE
Ketones, ur: NEGATIVE mg/dL
Leukocytes, UA: NEGATIVE
Nitrite: NEGATIVE
Protein, ur: NEGATIVE mg/dL
Specific Gravity, Urine: 1.025 (ref 1.005–1.030)
Urobilinogen, UA: 0.2 mg/dL (ref 0.0–1.0)
pH: 6 (ref 5.0–8.0)

## 2013-11-30 LAB — COMPREHENSIVE METABOLIC PANEL
ALT: 18 U/L (ref 0–35)
AST: 14 U/L (ref 0–37)
Albumin: 3.6 g/dL (ref 3.5–5.2)
Alkaline Phosphatase: 143 U/L — ABNORMAL HIGH (ref 39–117)
BUN: 8 mg/dL (ref 6–23)
CO2: 28 mEq/L (ref 19–32)
Calcium: 9.8 mg/dL (ref 8.4–10.5)
Chloride: 99 mEq/L (ref 96–112)
Creatinine, Ser: 0.61 mg/dL (ref 0.50–1.10)
GFR calc Af Amer: >90 mL/min (ref >90)
GFR calc non Af Amer: >90 mL/min (ref >90)
Glucose, Bld: 115 mg/dL — ABNORMAL HIGH (ref 70–99)
Potassium: 4 mEq/L (ref 3.7–5.3)
Sodium: 138 mEq/L (ref 137–147)
Total Bilirubin: 0.4 mg/dL (ref 0.3–1.2)
Total Protein: 8.1 g/dL (ref 6.0–8.3)

## 2013-11-30 LAB — CBC WITH DIFFERENTIAL/PLATELET
Basophils Absolute: 0 10*3/uL (ref 0.0–0.1)
Basophils Relative: 0 % (ref 0–1)
Eosinophils Absolute: 0.4 10*3/uL (ref 0.0–0.7)
Eosinophils Relative: 3 % (ref 0–5)
HCT: 36 % (ref 36.0–46.0)
Hemoglobin: 11.5 g/dL — ABNORMAL LOW (ref 12.0–15.0)
Lymphocytes Relative: 22 % (ref 12–46)
Lymphs Abs: 2.4 10*3/uL (ref 0.7–4.0)
MCH: 25.9 pg — ABNORMAL LOW (ref 26.0–34.0)
MCHC: 31.9 g/dL (ref 30.0–36.0)
MCV: 81.1 fL (ref 78.0–100.0)
Monocytes Absolute: 0.6 10*3/uL (ref 0.1–1.0)
Monocytes Relative: 6 % (ref 3–12)
Neutro Abs: 7.5 10*3/uL (ref 1.7–7.7)
Neutrophils Relative %: 69 % (ref 43–77)
Platelets: 424 10*3/uL — ABNORMAL HIGH (ref 150–400)
RBC: 4.44 MIL/uL (ref 3.87–5.11)
RDW: 16.1 % — ABNORMAL HIGH (ref 11.5–15.5)
WBC: 11 10*3/uL — ABNORMAL HIGH (ref 4.0–10.5)

## 2013-11-30 LAB — PREGNANCY, URINE: Preg Test, Ur: NEGATIVE

## 2013-11-30 MED ORDER — HYDROCODONE-ACETAMINOPHEN 5-325 MG PO TABS: 1.0000 | ORAL_TABLET | Freq: Four times a day (QID) | ORAL | Status: DC | PRN

## 2013-11-30 MED ORDER — IOHEXOL 300 MG/ML  SOLN
100.0000 mL | Freq: Once | INTRAMUSCULAR | Status: AC | PRN
  Administered 2013-11-30: 100 mL via INTRAVENOUS

## 2013-11-30 MED ORDER — IOHEXOL 300 MG/ML  SOLN
50.0000 mL | Freq: Once | INTRAMUSCULAR | Status: AC | PRN
  Administered 2013-11-30: 50 mL via ORAL

## 2013-11-30 MED ORDER — ONDANSETRON 4 MG PO TBDP: ORAL_TABLET | ORAL | Status: DC

## 2013-11-30 NOTE — ED Notes (Signed)
Pt c/o lower back pain that radiates around to her lower abdomen. Pt c/o nausea and states there is a chance she could be pregnant. Symptoms have been going on x 1 wk. Denies dysuria but does report urinary frequency.

## 2013-11-30 NOTE — ED Notes (Signed)
Pt reporting lower back pain and nausea for aproximately 1 week.  Denies vomiting.

## 2013-11-30 NOTE — Discharge Instructions (Signed)
Follow up with your md this week for recheck  °

## 2013-11-30 NOTE — ED Provider Notes (Signed)
CSN: [id number]     Arrival date & time 11/30/13  2109 History  This chart was scribed for Benny LennertJoseph L Mahi Zabriskie, MD by Evon Slackerrance Branch, ED Scribe. This patient was seen in room APA11/APA11 and the patient's care was started at 9:40 PM.    Chief Complaint  Patient presents with  . Abdominal Pain   Patient is a 23 y.o. female presenting with abdominal pain. The history is provided by the patient. No language interpreter was used.  Abdominal Pain Pain location:  Generalized Pain radiates to:  R flank Pain severity:  Moderate Onset quality:  Gradual Duration:  2 weeks Timing:  Intermittent Progression:  Worsening Chronicity:  New Relieved by:  None tried Worsened by:  Nothing tried Ineffective treatments:  None tried Associated symptoms: nausea   Associated symptoms: no chest pain, no cough, no diarrhea, no fatigue and no hematuria    HPI Comments: Holly Pope is a 23 y.o. female who presents to the Emergency Department complaining of intermittent generalized abdominal pain that radiates to her right lower back that started 2 weeks ago. She reports associated nausea. LNMP 10/28/13  Past Medical History  Diagnosis Date  . H/O removal of neck cyst   . Morbidly obese   . Hip dislocation, right 05/06/2012  . Hypertension   . Diabetes mellitus without complication    Past Surgical History  Procedure Laterality Date  . Cystectomy  2002    rmoval from neck  . Tooth extraction N/A 11/11/2013    Procedure: EXTRACT 3RD MOLARS PLUS ONE;  Surgeon: Georgia LopesScott M Jensen, DDS;  Location: [facility] OR;  Service: Oral Surgery;  Laterality: N/A;   Family History  Problem Relation Age of Onset  . Heart disease Mother   . Hypertension Mother   . Heart disease Father   . Hypertension Father   . Hypertension Brother    History  Substance Use Topics  . Smoking status: Never Smoker   . Smokeless tobacco: Never Used  . Alcohol Use: No   OB History   Grav Para Term Preterm Abortions TAB SAB Ect Mult  Living   1 1 1       1      Review of Systems  Constitutional: Negative for appetite change and fatigue.  HENT: Negative for congestion, ear discharge and sinus pressure.   Eyes: Negative for discharge.  Respiratory: Negative for cough.   Cardiovascular: Negative for chest pain.  Gastrointestinal: Positive for nausea and abdominal pain. Negative for diarrhea.  Genitourinary: Positive for flank pain. Negative for frequency and hematuria.  Skin: Negative for rash.  Neurological: Negative for seizures and headaches.  Psychiatric/Behavioral: Negative for hallucinations.      Allergies  Review of patient's allergies indicates no known allergies.  Home Medications   Prior to Admission medications   Medication Sig Start Date End Date Taking? Authorizing Provider  benazepril (LOTENSIN) 5 MG tablet Take 5 mg by mouth daily.    Historical Provider, MD  diclofenac (VOLTAREN) 75 MG EC tablet Take 75 mg by mouth 2 (two) times daily.    Historical Provider, MD  metFORMIN (GLUCOPHAGE) 1000 MG tablet Take 1,000 mg by mouth 2 (two) times daily with a meal.    Historical Provider, MD  oxyCODONE-acetaminophen (PERCOCET) 5-325 MG per tablet Take 1-2 tablets by mouth every 4 (four) hours as needed for severe pain. 11/11/13   Georgia LopesScott M Jensen, DDS   Triage Vitals: BP 139/90  Pulse 118  Temp(Src) 98.3 F (36.8 C)  Resp 20  Ht 5\' 2"  (1.575 m)  Wt 338 lb (153.316 kg)  BMI 61.81 kg/m2  SpO2 97%  LMP 10/28/2013  Physical Exam  Nursing note and vitals reviewed. Constitutional: She is oriented to person, place, and time. She appears well-developed.  HENT:  Head: Normocephalic.  Eyes: Conjunctivae and EOM are normal. No scleral icterus.  Neck: Neck supple. No thyromegaly present.  Cardiovascular: Normal rate and regular rhythm.  Exam reveals no gallop and no friction rub.   No murmur heard. Pulmonary/Chest: No stridor. She has no wheezes. She has no rales. She exhibits no tenderness.  Abdominal: She  exhibits no distension. There is tenderness. There is no rebound.  Mild right flank tenderness, mild tenderness throughout abdomen  Musculoskeletal: Normal range of motion. She exhibits no edema.  Lymphadenopathy:    She has no cervical adenopathy.  Neurological: She is oriented to person, place, and time. She exhibits normal muscle tone. Coordination normal.  Skin: No rash noted. No erythema.  Psychiatric: She has a normal mood and affect. Her behavior is normal.    ED Course  Procedures (including critical care time) DIAGNOSTIC STUDIES: Oxygen Saturation is 97% on RA, adequate by my interpretation.    COORDINATION OF CARE:  9:47 PM-Discussed treatment plan which includes CBC panel, CMP, UA with pt at bedside and pt agreed to plan.    Labs Review Labs Reviewed  CBC WITH DIFFERENTIAL - Abnormal; Notable for the following:    WBC 11.0 (*)    Hemoglobin 11.5 (*)    MCH 25.9 (*)    RDW 16.1 (*)    Platelets 424 (*)    All other components within normal limits  COMPREHENSIVE METABOLIC PANEL - Abnormal; Notable for the following:    Glucose, Bld 115 (*)    Alkaline Phosphatase 143 (*)    All other components within normal limits  PREGNANCY, URINE  URINALYSIS, ROUTINE W REFLEX MICROSCOPIC    Imaging Review No results found.   EKG Interpretation None      MDM   Final diagnoses:  None    Abdominal pain  The chart was scribed for me under my direct supervision.  I personally performed the history, physical, and medical decision making and all procedures in the evaluation of this patient.Benny Lennert.     Daphna Lafuente L Zonie Crutcher, MD 11/30/13 [phone]

## 2013-12-01 NOTE — ED Provider Notes (Signed)
Patient initially seen and evaluated by Dr. Estell HarpinZammit, sent for CT scan to evaluate abdominal pain. CT is unremarkable, appendix clearly seen and normal. She is resting comfortably and is in no distress. She will be discharged.  Dione Boozeavid Alizza Sacra, MD 12/01/13 [phone]

## 2014-01-30 ENCOUNTER — Emergency Department (HOSPITAL_COMMUNITY)
Admission: EM | Admit: 2014-01-30 | Discharge: 2014-01-30 | Disposition: A | Discharge status: Home / Self Care | Payer: Medicaid Other | Attending: Emergency Medicine | Admitting: Emergency Medicine

## 2014-01-30 ENCOUNTER — Encounter (HOSPITAL_COMMUNITY): Payer: Self-pay | Admitting: Emergency Medicine

## 2014-01-30 DIAGNOSIS — Z791 Long term (current) use of non-steroidal anti-inflammatories (NSAID): Secondary | ICD-10-CM | POA: Insufficient documentation

## 2014-01-30 DIAGNOSIS — E119 Type 2 diabetes mellitus without complications: Secondary | ICD-10-CM | POA: Insufficient documentation

## 2014-01-30 DIAGNOSIS — Z79899 Other long term (current) drug therapy: Secondary | ICD-10-CM | POA: Insufficient documentation

## 2014-01-30 DIAGNOSIS — Z87828 Personal history of other (healed) physical injury and trauma: Secondary | ICD-10-CM | POA: Insufficient documentation

## 2014-01-30 DIAGNOSIS — J039 Acute tonsillitis, unspecified: Secondary | ICD-10-CM | POA: Insufficient documentation

## 2014-01-30 DIAGNOSIS — I10 Essential (primary) hypertension: Secondary | ICD-10-CM | POA: Insufficient documentation

## 2014-01-30 MED ORDER — MAGIC MOUTHWASH W/LIDOCAINE: 5.0000 mL | Freq: Three times a day (TID) | ORAL | Status: DC | PRN

## 2014-01-30 MED ORDER — GUAIFENESIN-CODEINE 100-10 MG/5ML PO SYRP: 10.0000 mL | ORAL_SOLUTION | Freq: Three times a day (TID) | ORAL | Status: DC | PRN

## 2014-01-30 MED ORDER — AMOXICILLIN 250 MG PO CAPS
500.0000 mg | ORAL_CAPSULE | Freq: Once | ORAL | Status: AC
  Administered 2014-01-30: 500 mg via ORAL
  Filled 2014-01-30: qty 2

## 2014-01-30 MED ORDER — AMOXICILLIN 500 MG PO CAPS: 500.0000 mg | ORAL_CAPSULE | Freq: Three times a day (TID) | ORAL | Status: DC

## 2014-01-30 NOTE — ED Provider Notes (Signed)
CSN: [id number]     Arrival date & time 01/30/14  2058 History   First MD Initiated Contact with Patient 01/30/14 2143     Chief Complaint  Patient presents with  . Sore Throat     (Consider location/radiation/quality/duration/timing/severity/associated sxs/prior Treatment) Patient is a 23 y.o. female presenting with pharyngitis. The history is provided by the patient.  Sore Throat This is a new problem. Episode onset: 4 days. The problem occurs constantly. The problem has been unchanged. Associated symptoms include congestion, coughing, a sore throat and swollen glands. Pertinent negatives include no abdominal pain, arthralgias, chest pain, chills, fever, headaches, joint swelling, nausea, neck pain, numbness, rash, urinary symptoms, visual change, vomiting or weakness. The symptoms are aggravated by swallowing. She has tried nothing for the symptoms. The treatment provided no relief.    Past Medical History  Diagnosis Date  . H/O removal of neck cyst   . Morbidly obese   . Hip dislocation, right 05/06/2012  . Hypertension   . Diabetes mellitus without complication    Past Surgical History  Procedure Laterality Date  . Cystectomy  2002    rmoval from neck  . Tooth extraction N/A 11/11/2013    Procedure: EXTRACT 3RD MOLARS PLUS ONE;  Surgeon: Georgia LopesScott M Jensen, DDS;  Location: [facility] OR;  Service: Oral Surgery;  Laterality: N/A;   Family History  Problem Relation Age of Onset  . Heart disease Mother   . Hypertension Mother   . Heart disease Father   . Hypertension Father   . Hypertension Brother    History  Substance Use Topics  . Smoking status: Never Smoker   . Smokeless tobacco: Never Used  . Alcohol Use: No   OB History   Grav Para Term Preterm Abortions TAB SAB Ect Mult Living   1 1 1       1      Review of Systems  Constitutional: Negative for fever, chills, activity change and appetite change.  HENT: Positive for congestion, sinus pressure and sore throat. Negative for  ear pain, facial swelling, sneezing, trouble swallowing and voice change.   Eyes: Negative for pain and visual disturbance.  Respiratory: Positive for cough. Negative for shortness of breath and wheezing.   Cardiovascular: Negative for chest pain.  Gastrointestinal: Negative for nausea, vomiting and abdominal pain.  Musculoskeletal: Negative for arthralgias, joint swelling, neck pain and neck stiffness.  Skin: Negative for color change and rash.  Neurological: Negative for dizziness, facial asymmetry, speech difficulty, weakness, numbness and headaches.  Hematological: Negative for adenopathy.  All other systems reviewed and are negative.     Allergies  Review of patient's allergies indicates no known allergies.  Home Medications   Prior to Admission medications   Medication Sig Start Date End Date Taking? Authorizing Provider  benazepril (LOTENSIN) 5 MG tablet Take 5 mg by mouth daily.    Historical Provider, MD  diclofenac (VOLTAREN) 75 MG EC tablet Take 75 mg by mouth 2 (two) times daily.    Historical Provider, MD  HYDROcodone-acetaminophen (NORCO/VICODIN) 5-325 MG per tablet Take 1 tablet by mouth every 6 (six) hours as needed for moderate pain. 11/30/13   Benny LennertJoseph L Zammit, MD  metFORMIN (GLUCOPHAGE) 1000 MG tablet Take 1,000 mg by mouth 2 (two) times daily with a meal.    Historical Provider, MD  ondansetron (ZOFRAN ODT) 4 MG disintegrating tablet 4mg  ODT q4 hours prn nausea/vomit 11/30/13   Benny LennertJoseph L Zammit, MD   BP 120/89  Pulse 120  Temp(Src) 98.6  F (37 C) (Oral)  Resp 20  Ht 5\' 2"  (1.575 m)  Wt 332 lb (150.594 kg)  BMI 60.71 kg/m2  SpO2 99%  LMP 01/16/2014  Breastfeeding? No Physical Exam  Nursing note and vitals reviewed. Constitutional: She is oriented to person, place, and time. She appears well-developed and well-nourished. No distress.  Pt is morbidly obese  HENT:  Head: Normocephalic and atraumatic.  Mouth/Throat: Uvula is midline and mucous membranes are  normal. No trismus in the jaw. No uvula swelling. Posterior oropharyngeal edema and posterior oropharyngeal erythema present. No oropharyngeal exudate or tonsillar abscesses.  Significant erythema and edema of the bilateral tonsils.  No exudates, no evidence of PTA.  Uvula midline and non edematous.    Neck: Normal range of motion. Neck supple.  Cardiovascular: Normal rate, regular rhythm, normal heart sounds and intact distal pulses.   No murmur heard. Pulmonary/Chest: Effort normal and breath sounds normal. No respiratory distress. She has no wheezes. She has no rales. She exhibits no tenderness.  Musculoskeletal: Normal range of motion. She exhibits no edema.  Lymphadenopathy:    She has cervical adenopathy.  Neurological: She is alert and oriented to person, place, and time. She exhibits normal muscle tone. Coordination normal.  Skin: Skin is warm and dry.    ED Course  Procedures (including critical care time) Labs Review Labs Reviewed - No data to display  Imaging Review No results found.   EKG Interpretation None      MDM   Final diagnoses:  Tonsillitis    Patient is well appearing, non-toxic.  VSS.  Uvula is midline.  Hx of frequent strep throat.  No evidence of PTA, no neck tenderness.  She agrees to fluids, tylenol and or ibuprofen for fever and close f/u with her PMD.  She appears stable for discharge and agrees to plan.      Caige Almeda L. Trisha Mangleriplett, PA-C 02/01/14 2109

## 2014-01-30 NOTE — Discharge Instructions (Signed)
Tonsillitis °Tonsillitis is an infection of the throat. This infection causes the tonsils to become red, tender, and puffy (swollen). Tonsils are groups of tissue at the back of your throat. If bacteria caused your infection, antibiotic medicine will be given to you. Sometimes symptoms of tonsillitis can be relieved with the use of steroid medicine. If your tonsillitis is severe and happens often, you may need to get your tonsils removed (tonsillectomy). °HOME CARE  °· Rest and sleep often. °· Drink enough fluids to keep your pee (urine) clear or pale yellow. °· While your throat is sore, eat soft or liquid foods like: °¨ Soup. °¨ Ice cream. °¨ Instant breakfast drinks. °· Eat frozen ice pops. °· Gargle with a warm or cold liquid to help soothe the throat. Gargle with a water and salt mix. Mix 1/4 teaspoon of salt and 1/4 teaspoon of baking soda in 1 cup of water. °· Only take medicines as told by your doctor. °· If you are given medicines (antibiotics), take them as told. Finish them even if you start to feel better. °GET HELP IF: °· You have large, tender lumps in your neck. °· You have a rash. °· You cough up green, yellow-brown, or bloody fluid. °· You cannot swallow liquids or food for 24 hours. °· You notice that only one of your tonsils is swollen. °GET HELP RIGHT AWAY IF:  °· You throw up (vomit). °· You have a very bad headache. °· You have a stiff neck. °· You have chest pain. °· You have trouble breathing or swallowing. °· You have bad throat pain, drooling, or your voice changes. °· You have bad pain not helped by medicine. °· You cannot fully open your mouth. °· You have redness, puffiness, or bad pain in the neck. °· You have a fever. °MAKE SURE YOU:  °· Understand these instructions. °· Will watch your condition. °· Will get help right away if you are not doing well or get worse. °Document Released: 01/11/2008 Document Revised: 07/30/2013 Document Reviewed: 01/11/2013 °[employer]® Patient Information  ©2015 [employer]. This information is not intended to replace advice given to you by your health care provider. Make sure you discuss any questions you have with your health care provider. ° °

## 2014-01-30 NOTE — ED Notes (Signed)
Sore throat for 4 days, sinus congestion, cough,

## 2014-02-04 NOTE — ED Provider Notes (Signed)
Medical screening examination/treatment/procedure(s) were performed by non-physician practitioner and as supervising physician I was immediately available for consultation/collaboration.   EKG Interpretation None        Vanetta MuldersScott Ahmaud Duthie, MD 02/04/14 [phone]

## 2014-02-18 ENCOUNTER — Encounter (HOSPITAL_COMMUNITY): Payer: Self-pay | Admitting: Emergency Medicine

## 2014-02-18 ENCOUNTER — Emergency Department (HOSPITAL_COMMUNITY)
Admission: EM | Admit: 2014-02-18 | Discharge: 2014-02-19 | Disposition: A | Discharge status: Home / Self Care | Payer: Medicaid Other | Attending: Emergency Medicine | Admitting: Emergency Medicine

## 2014-02-18 DIAGNOSIS — I10 Essential (primary) hypertension: Secondary | ICD-10-CM | POA: Diagnosis not present

## 2014-02-18 DIAGNOSIS — Z87828 Personal history of other (healed) physical injury and trauma: Secondary | ICD-10-CM | POA: Diagnosis not present

## 2014-02-18 DIAGNOSIS — Z79899 Other long term (current) drug therapy: Secondary | ICD-10-CM | POA: Diagnosis not present

## 2014-02-18 DIAGNOSIS — R000 Tachycardia, unspecified: Secondary | ICD-10-CM | POA: Insufficient documentation

## 2014-02-18 DIAGNOSIS — E119 Type 2 diabetes mellitus without complications: Secondary | ICD-10-CM | POA: Insufficient documentation

## 2014-02-18 DIAGNOSIS — J029 Acute pharyngitis, unspecified: Secondary | ICD-10-CM | POA: Diagnosis present

## 2014-02-18 MED ORDER — IBUPROFEN 800 MG PO TABS
800.0000 mg | ORAL_TABLET | Freq: Once | ORAL | Status: AC
  Administered 2014-02-19: 800 mg via ORAL
  Filled 2014-02-18: qty 1

## 2014-02-18 NOTE — ED Notes (Signed)
Sore throat hurting x 4 days, difficulty to swallow, cough, congestion, fever

## 2014-02-19 LAB — RAPID STREP SCREEN (MED CTR MEBANE ONLY): Streptococcus, Group A Screen (Direct): NEGATIVE

## 2014-02-19 MED ORDER — IBUPROFEN 600 MG PO TABS: 600.0000 mg | ORAL_TABLET | Freq: Four times a day (QID) | ORAL | Status: DC | PRN

## 2014-02-19 MED ORDER — FIRST-DUKES MOUTHWASH MT SUSP: 5.0000 mL | Freq: Four times a day (QID) | OROMUCOSAL | Status: DC | PRN

## 2014-02-19 NOTE — Discharge Instructions (Signed)
You may use the magic mouthwash gargle and the ibuprofen you are prescribed for throat pain - use as instructed.  Your strep test is negative today.   Pharyngitis Pharyngitis is redness, pain, and swelling (inflammation) of your pharynx.  CAUSES  Pharyngitis is usually caused by infection. Most of the time, these infections are from viruses (viral) and are part of a cold. However, sometimes pharyngitis is caused by bacteria (bacterial). Pharyngitis can also be caused by allergies. Viral pharyngitis may be spread from person to person by coughing, sneezing, and personal items or utensils (cups, forks, spoons, toothbrushes). Bacterial pharyngitis may be spread from person to person by more intimate contact, such as kissing.  SIGNS AND SYMPTOMS  Symptoms of pharyngitis include:   Sore throat.   Tiredness (fatigue).   Low-grade fever.   Headache.  Joint pain and muscle aches.  Skin rashes.  Swollen lymph nodes.  Plaque-like film on throat or tonsils (often seen with bacterial pharyngitis). DIAGNOSIS  Your health care provider will ask you questions about your illness and your symptoms. Your medical history, along with a physical exam, is often all that is needed to diagnose pharyngitis. Sometimes, a rapid strep test is done. Other lab tests may also be done, depending on the suspected cause.  TREATMENT  Viral pharyngitis will usually get better in 3-4 days without the use of medicine. Bacterial pharyngitis is treated with medicines that kill germs (antibiotics).  HOME CARE INSTRUCTIONS   Drink enough water and fluids to keep your urine clear or pale yellow.   Only take over-the-counter or prescription medicines as directed by your health care provider:   If you are prescribed antibiotics, make sure you finish them even if you start to feel better.   Do not take aspirin.   Get lots of rest.   Gargle with 8 oz of salt water ( tsp of salt per 1 qt of water) as often as  every 1-2 hours to soothe your throat.   Throat lozenges (if you are not at risk for choking) or sprays may be used to soothe your throat. SEEK MEDICAL CARE IF:   You have large, tender lumps in your neck.  You have a rash.  You cough up green, yellow-brown, or bloody spit. SEEK IMMEDIATE MEDICAL CARE IF:   Your neck becomes stiff.  You drool or are unable to swallow liquids.  You vomit or are unable to keep medicines or liquids down.  You have severe pain that does not go away with the use of recommended medicines.  You have trouble breathing (not caused by a stuffy nose). MAKE SURE YOU:   Understand these instructions.  Will watch your condition.  Will get help right away if you are not doing well or get worse. Document Released: 07/25/2005 Document Revised: 05/15/2013 Document Reviewed: 04/01/2013 [facility] Patient Information 2015 [place], [place]. This information is not intended to replace advice given to you by your health care provider. Make sure you discuss any questions you have with your health care provider.  Salt Water Gargle This solution will help make your mouth and throat feel better. HOME CARE INSTRUCTIONS   Mix 1 teaspoon of salt in 8 ounces of warm water.  Gargle with this solution as much or often as you need or as directed. Swish and gargle gently if you have any sores or wounds in your mouth.  Do not swallow this mixture. Document Released: 04/28/2004 Document Revised: 10/17/2011 Document Reviewed: 09/19/2008 [facility] Patient Information 2015 [place], [place].  This information is not intended to replace advice given to you by your health care provider. Make sure you discuss any questions you have with your health care provider. ° ° °

## 2014-02-19 NOTE — ED Provider Notes (Signed)
Medical screening examination/treatment/procedure(s) were performed by non-physician practitioner and as supervising physician I was immediately available for consultation/collaboration.   Desmund Elman, MD 02/19/14 0546 

## 2014-02-19 NOTE — ED Notes (Signed)
Patient given discharge instruction, verbalized understand. Patient ambulatory out of the department.  

## 2014-02-19 NOTE — ED Provider Notes (Signed)
CSN: [id number]     Arrival date & time 02/18/14  2218 History   First MD Initiated Contact with Patient 02/18/14 2303     Chief Complaint  Patient presents with  . Sore Throat     (Consider location/radiation/quality/duration/timing/severity/associated sxs/prior Treatment) HPI   Holly Pope is a 23 y.o. female presenting with a 4 day history of sore throat which is worsened with swallowing.  She reports a history of frequent strep throat infections,  Most recently treated for this 5 months ago, but was also treated last month for acute tonsillitis.  She has had nasal congestion with clear rhinorrhea, denies ear or facial pain, denies headache.  She has had a nonproductive cough but denies chest pain and shortness of breath.  Of note, she is tachycardic on exam today.  She denies lightheadedness and has been maintaining PO fluids.  She denies feeling of palpitations.  She does report getting nervous when she comes to the ed.  She has taken no medicines prior to arrival.    Past Medical History  Diagnosis Date  . H/O removal of neck cyst   . Morbidly obese   . Hip dislocation, right 05/06/2012  . Hypertension   . Diabetes mellitus without complication    Past Surgical History  Procedure Laterality Date  . Cystectomy  2002    rmoval from neck  . Tooth extraction N/A 11/11/2013    Procedure: EXTRACT 3RD MOLARS PLUS ONE;  Surgeon: Georgia Lopes, DDS;  Location: [facility] OR;  Service: Oral Surgery;  Laterality: N/A;   Family History  Problem Relation Age of Onset  . Heart disease Mother   . Hypertension Mother   . Heart disease Father   . Hypertension Father   . Hypertension Brother    History  Substance Use Topics  . Smoking status: Never Smoker   . Smokeless tobacco: Never Used  . Alcohol Use: No   OB History   Grav Para Term Preterm Abortions TAB SAB Ect Mult Living   1 1 1       1      Review of Systems  Constitutional: Negative for fever and chills.  HENT: Positive  for rhinorrhea and sore throat. Negative for congestion, ear discharge, ear pain, sinus pressure, trouble swallowing and voice change.   Eyes: Negative for discharge.  Respiratory: Positive for cough. Negative for shortness of breath, wheezing and stridor.   Cardiovascular: Negative for chest pain.  Gastrointestinal: Negative for abdominal pain.  Genitourinary: Negative.       Allergies  Review of patient's allergies indicates no known allergies.  Home Medications   Prior to Admission medications   Medication Sig Start Date End Date Taking? Authorizing Provider  benazepril (LOTENSIN) 5 MG tablet Take 5 mg by mouth daily.   Yes Historical Provider, MD  Diphenhyd-Hydrocort-Nystatin (FIRST-DUKES MOUTHWASH) SUSP Use as directed 5 mLs in the mouth or throat 4 (four) times daily as needed. 02/19/14   Burgess Amor, PA-C  ibuprofen (ADVIL,MOTRIN) 600 MG tablet Take 1 tablet (600 mg total) by mouth every 6 (six) hours as needed. 02/19/14   Burgess Amor, PA-C   BP 133/74  Pulse 126  Temp(Src) 98.5 F (36.9 C) (Oral)  Resp 18  Ht 5\' 2"  (1.575 m)  Wt 332 lb (150.594 kg)  BMI 60.71 kg/m2  SpO2 97%  LMP 01/09/2014 Physical Exam  Constitutional: She is oriented to person, place, and time. She appears well-developed and well-nourished.  HENT:  Head: Normocephalic and atraumatic.  Right Ear: Tympanic membrane, external ear and ear canal normal.  Left Ear: Tympanic membrane, external ear and ear canal normal.  Nose: Rhinorrhea present. No mucosal edema.  Mouth/Throat: Uvula is midline, oropharynx is clear and moist and mucous membranes are normal. No oropharyngeal exudate, posterior oropharyngeal edema, posterior oropharyngeal erythema or tonsillar abscesses.  Mild erythema of soft palate, no exudate,  No tonsillar hypertrophy.   Eyes: Conjunctivae are normal.  Neck: Normal range of motion present.  Cardiovascular: Normal heart sounds.  Tachycardia present.   Pulmonary/Chest: Effort normal. No  respiratory distress. She has no wheezes. She has no rales.  Abdominal: Soft. There is no tenderness.  Musculoskeletal: Normal range of motion.  Lymphadenopathy:    She has no cervical adenopathy.  Neurological: She is alert and oriented to person, place, and time.  Skin: Skin is warm and dry. No rash noted.  Psychiatric: She has a normal mood and affect.    ED Course  Procedures (including critical care time) Labs Review Labs Reviewed  RAPID STREP SCREEN  CULTURE, GROUP A STREP    Imaging Review No results found.   EKG Interpretation None      MDM   Final diagnoses:  Viral pharyngitis    Review of chart reveals tachycardia on each of at least her last 6 visits.  Orthostatics obtained,  She is not orthostatic.  She has maintained po fluids.  Ibuprofen given here.  Strep negative, cx sent.  Discussed result with her.  Prescribed ibuprofen, dukes magic mouthwash. Prn f/u anticipated.  Pt denies cp, denies sob.  Suspect possible white coat syndrome as source of tachycardia.    Burgess AmorJulie Porsche Noguchi, PA-C 02/19/14 0118  Burgess AmorJulie Myli Pae, PA-C 02/19/14 [phone]

## 2014-02-20 LAB — CULTURE, GROUP A STREP
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
## 2014-06-01 ENCOUNTER — Emergency Department (HOSPITAL_COMMUNITY)
Admission: EM | Admit: 2014-06-01 | Discharge: 2014-06-01 | Disposition: A | Discharge status: Home / Self Care | Payer: Medicaid Other | Attending: Emergency Medicine | Admitting: Emergency Medicine

## 2014-06-01 ENCOUNTER — Encounter (HOSPITAL_COMMUNITY): Payer: Self-pay | Admitting: Emergency Medicine

## 2014-06-01 DIAGNOSIS — I10 Essential (primary) hypertension: Secondary | ICD-10-CM | POA: Diagnosis not present

## 2014-06-01 DIAGNOSIS — H6123 Impacted cerumen, bilateral: Secondary | ICD-10-CM | POA: Diagnosis not present

## 2014-06-01 DIAGNOSIS — R000 Tachycardia, unspecified: Secondary | ICD-10-CM | POA: Insufficient documentation

## 2014-06-01 DIAGNOSIS — Z9889 Other specified postprocedural states: Secondary | ICD-10-CM | POA: Diagnosis not present

## 2014-06-01 DIAGNOSIS — E119 Type 2 diabetes mellitus without complications: Secondary | ICD-10-CM | POA: Insufficient documentation

## 2014-06-01 DIAGNOSIS — Z87828 Personal history of other (healed) physical injury and trauma: Secondary | ICD-10-CM | POA: Diagnosis not present

## 2014-06-01 DIAGNOSIS — Z79899 Other long term (current) drug therapy: Secondary | ICD-10-CM | POA: Diagnosis not present

## 2014-06-01 DIAGNOSIS — R110 Nausea: Secondary | ICD-10-CM | POA: Insufficient documentation

## 2014-06-01 DIAGNOSIS — H9203 Otalgia, bilateral: Secondary | ICD-10-CM | POA: Diagnosis present

## 2014-06-01 DIAGNOSIS — H6093 Unspecified otitis externa, bilateral: Secondary | ICD-10-CM | POA: Insufficient documentation

## 2014-06-01 DIAGNOSIS — J069 Acute upper respiratory infection, unspecified: Secondary | ICD-10-CM | POA: Insufficient documentation

## 2014-06-01 MED ORDER — NEOMYCIN-POLYMYXIN-HC 1 % OT SOLN
3.0000 [drp] | Freq: Once | OTIC | Status: AC
  Administered 2014-06-01: 3 [drp] via OTIC
  Filled 2014-06-01: qty 10

## 2014-06-01 MED ORDER — LORATADINE 10 MG PO TABS: 10.0000 mg | ORAL_TABLET | Freq: Every day | ORAL | Status: DC

## 2014-06-01 NOTE — ED Provider Notes (Signed)
CSN: [id number]     Arrival date & time 06/01/14  1608 History  This chart was scribed for non-physician practitioner Kerrie BuffaloHope Brittiany Wiehe, NP, working with Donnetta HutchingBrian Cook, MD by Littie Deedsichard Sun, ED Scribe. This patient was seen in room APFT22/APFT22 and the patient's care was started at 5:26 PM.      Chief Complaint  Patient presents with  . Otalgia     Patient is a 23 y.o. female presenting with ear pain. The history is provided by the patient. No language interpreter was used.  Otalgia Location:  Bilateral Severity:  Severe Onset quality:  Gradual Duration:  2 weeks Timing:  Constant Progression:  Unchanged Chronicity:  New Associated symptoms: congestion, cough, hearing loss and sore throat   Associated symptoms: no abdominal pain, no diarrhea, no fever, no headaches, no rash and no vomiting    HPI Comments: Raeanne BarryDanielle N Arch is a 23 y.o. female who presents to the Emergency Department complaining of gradual onset, constant bilateral otalgia that began 2 weeks ago. The pain is described as stabbing and a 10/10 in severity. She states that her ears feel "stopped up". Patient reports associated congestion, chills, mild hearing loss, sore throat, cough and nausea. She denies fever, vomiting, diarrhea, constipation and abdominal pain.   Past Medical History  Diagnosis Date  . H/O removal of neck cyst   . Morbidly obese   . Hip dislocation, right 05/06/2012  . Hypertension   . Diabetes mellitus without complication    Past Surgical History  Procedure Laterality Date  . Cystectomy  2002    rmoval from neck  . Tooth extraction N/A 11/11/2013    Procedure: EXTRACT 3RD MOLARS PLUS ONE;  Surgeon: Georgia LopesScott M Jensen, DDS;  Location: [facility] OR;  Service: Oral Surgery;  Laterality: N/A;   Family History  Problem Relation Age of Onset  . Heart disease Mother   . Hypertension Mother   . Heart disease Father   . Hypertension Father   . Hypertension Brother    History  Substance Use Topics  . Smoking  status: Never Smoker   . Smokeless tobacco: Never Used  . Alcohol Use: No   OB History   Grav Para Term Preterm Abortions TAB SAB Ect Mult Living   1 1 1       1      Review of Systems  Constitutional: Positive for chills. Negative for fever, appetite change and fatigue.  HENT: Positive for congestion, ear pain, hearing loss and sore throat.   Eyes: Negative for discharge.  Respiratory: Positive for cough.   Cardiovascular: Negative for chest pain.  Gastrointestinal: Positive for nausea. Negative for vomiting, abdominal pain, diarrhea and constipation.  Genitourinary: Negative for frequency and hematuria.  Musculoskeletal: Negative for back pain.  Skin: Negative for rash.  Neurological: Negative for seizures and headaches.  Psychiatric/Behavioral: Negative for hallucinations.      Allergies  Review of patient's allergies indicates no known allergies.  Home Medications   Prior to Admission medications   Medication Sig Start Date End Date Taking? Authorizing Provider  benazepril (LOTENSIN) 5 MG tablet Take 5 mg by mouth daily.    Historical Provider, MD  Diphenhyd-Hydrocort-Nystatin (FIRST-DUKES MOUTHWASH) SUSP Use as directed 5 mLs in the mouth or throat 4 (four) times daily as needed. 02/19/14   Burgess AmorJulie Idol, PA-C  ibuprofen (ADVIL,MOTRIN) 600 MG tablet Take 1 tablet (600 mg total) by mouth every 6 (six) hours as needed. 02/19/14   Burgess AmorJulie Idol, PA-C   BP 133/89  Pulse  118  Temp(Src) 98.9 F (37.2 C) (Oral)  Resp 16  Ht 5\' 1"  (1.549 m)  Wt 329 lb 12.9 oz (149.6 kg)  BMI 62.35 kg/m2  SpO2 99% Physical Exam  Nursing note and vitals reviewed. Constitutional: She is oriented to person, place, and time. She appears well-developed and well-nourished. No distress.  HENT:  Head: Normocephalic and atraumatic.  Mouth/Throat: Uvula is midline and oropharynx is clear and moist. No oropharyngeal exudate, posterior oropharyngeal edema or posterior oropharyngeal erythema.  Throat normal.  Uvula midline. No erythema or edema. Bilateral TMs are occluded by cerumen.  Eyes: Conjunctivae and EOM are normal. Pupils are equal, round, and reactive to light.  Neck: Normal range of motion. Neck supple.  No meningeal signs.  Cardiovascular: Regular rhythm.  Tachycardia present.   No murmur heard. Tachycardic.  Pulmonary/Chest: Effort normal and breath sounds normal. No respiratory distress. She has no wheezes. She has no rales.  Abdominal: Soft. There is no tenderness.  Musculoskeletal: She exhibits no edema.  Lymphadenopathy:    She has no cervical adenopathy.  Neurological: She is alert and oriented to person, place, and time. No cranial nerve deficit.  Skin: Skin is warm and dry. No rash noted.  Psychiatric: She has a normal mood and affect. Her behavior is normal.    ED Course  Procedures  DIAGNOSTIC STUDIES: Oxygen Saturation is 99% on RA, nml by my interpretation.    COORDINATION OF CARE: 5:30 PM-Discussed treatment plan which includes ear wax removal with pt at bedside and pt agreed to plan.    MDM  23 y.o. female with bilateral ear discomfort due to cerumen impaction. Ear irrigation with good results. Patient stable for discharge. Will treat for nasal congestion. She will return as needed.    Medication List    TAKE these medications       loratadine 10 MG tablet  Commonly known as:  CLARITIN  Take 1 tablet (10 mg total) by mouth daily.      ASK your doctor about these medications       benazepril 5 MG tablet  Commonly known as:  LOTENSIN  Take 5 mg by mouth daily.     FIRST-DUKES MOUTHWASH Susp  Use as directed 5 mLs in the mouth or throat 4 (four) times daily as needed.     ibuprofen 600 MG tablet  Commonly known as:  ADVIL,MOTRIN  Take 1 tablet (600 mg total) by mouth every 6 (six) hours as needed.       I personally performed the services described in this documentation, which was scribed in my presence. The recorded information has been reviewed  and is accurate.    [facility] Orlene OchM Alassane Kalafut, [place]NP 06/07/14 [phone]

## 2014-06-01 NOTE — ED Notes (Signed)
Pt with bilateral ear pain for 2 weeks, states "feel stopped up", denies any other symptoms

## 2014-06-09 ENCOUNTER — Encounter (HOSPITAL_COMMUNITY): Payer: Self-pay | Admitting: Emergency Medicine

## 2014-11-27 ENCOUNTER — Emergency Department (HOSPITAL_COMMUNITY): Payer: Medicaid Other

## 2014-11-27 ENCOUNTER — Emergency Department (HOSPITAL_COMMUNITY)
Admission: EM | Admit: 2014-11-27 | Discharge: 2014-11-28 | Disposition: A | Discharge status: Home / Self Care | Payer: Medicaid Other | Attending: Emergency Medicine | Admitting: Emergency Medicine

## 2014-11-27 ENCOUNTER — Encounter (HOSPITAL_COMMUNITY): Payer: Self-pay | Admitting: Emergency Medicine

## 2014-11-27 DIAGNOSIS — R1032 Left lower quadrant pain: Secondary | ICD-10-CM | POA: Insufficient documentation

## 2014-11-27 DIAGNOSIS — E119 Type 2 diabetes mellitus without complications: Secondary | ICD-10-CM | POA: Insufficient documentation

## 2014-11-27 DIAGNOSIS — R109 Unspecified abdominal pain: Secondary | ICD-10-CM

## 2014-11-27 DIAGNOSIS — I10 Essential (primary) hypertension: Secondary | ICD-10-CM | POA: Insufficient documentation

## 2014-11-27 DIAGNOSIS — Z8781 Personal history of (healed) traumatic fracture: Secondary | ICD-10-CM | POA: Diagnosis not present

## 2014-11-27 DIAGNOSIS — R1012 Left upper quadrant pain: Secondary | ICD-10-CM | POA: Insufficient documentation

## 2014-11-27 DIAGNOSIS — Z79899 Other long term (current) drug therapy: Secondary | ICD-10-CM | POA: Diagnosis not present

## 2014-11-27 DIAGNOSIS — R52 Pain, unspecified: Secondary | ICD-10-CM

## 2014-11-27 DIAGNOSIS — Z3202 Encounter for pregnancy test, result negative: Secondary | ICD-10-CM | POA: Insufficient documentation

## 2014-11-27 LAB — CBC WITH DIFFERENTIAL/PLATELET
Basophils Absolute: 0.1 10*3/uL (ref 0.0–0.1)
Basophils Relative: 0 % (ref 0–1)
Eosinophils Absolute: 0.6 10*3/uL (ref 0.0–0.7)
Eosinophils Relative: 5 % (ref 0–5)
HCT: 33.5 % — ABNORMAL LOW (ref 36.0–46.0)
Hemoglobin: 10.3 g/dL — ABNORMAL LOW (ref 12.0–15.0)
Lymphocytes Relative: 24 % (ref 12–46)
Lymphs Abs: 2.7 10*3/uL (ref 0.7–4.0)
MCH: 23.5 pg — ABNORMAL LOW (ref 26.0–34.0)
MCHC: 30.7 g/dL (ref 30.0–36.0)
MCV: 76.5 fL — ABNORMAL LOW (ref 78.0–100.0)
Monocytes Absolute: 0.5 10*3/uL (ref 0.1–1.0)
Monocytes Relative: 5 % (ref 3–12)
Neutro Abs: 7.3 10*3/uL (ref 1.7–7.7)
Neutrophils Relative %: 66 % (ref 43–77)
Platelets: 354 10*3/uL (ref 150–400)
RBC: 4.38 MIL/uL (ref 3.87–5.11)
RDW: 16.2 % — ABNORMAL HIGH (ref 11.5–15.5)
WBC: 11.1 10*3/uL — ABNORMAL HIGH (ref 4.0–10.5)

## 2014-11-27 LAB — BASIC METABOLIC PANEL
Anion gap: 8 (ref 5–15)
BUN: 7 mg/dL (ref 6–23)
CO2: 24 mmol/L (ref 19–32)
Calcium: 8.8 mg/dL (ref 8.4–10.5)
Chloride: 105 mmol/L (ref 96–112)
Creatinine, Ser: 0.64 mg/dL (ref 0.50–1.10)
GFR calc Af Amer: >90 mL/min (ref >90)
GFR calc non Af Amer: >90 mL/min (ref >90)
Glucose, Bld: 104 mg/dL — ABNORMAL HIGH (ref 70–99)
Potassium: 3.4 mmol/L — ABNORMAL LOW (ref 3.5–5.1)
Sodium: 137 mmol/L (ref 135–145)

## 2014-11-27 LAB — URINALYSIS, ROUTINE W REFLEX MICROSCOPIC
Bilirubin Urine: NEGATIVE
Glucose, UA: NEGATIVE mg/dL
Hgb urine dipstick: NEGATIVE
Ketones, ur: NEGATIVE mg/dL
Leukocytes, UA: NEGATIVE
Nitrite: NEGATIVE
Protein, ur: NEGATIVE mg/dL
Specific Gravity, Urine: >1.03 — ABNORMAL HIGH (ref 1.005–1.030)
Urobilinogen, UA: 0.2 mg/dL (ref 0.0–1.0)
pH: 5.5 (ref 5.0–8.0)

## 2014-11-27 LAB — PREGNANCY, URINE: Preg Test, Ur: NEGATIVE

## 2014-11-27 NOTE — ED Provider Notes (Signed)
CSN: [id number]     Arrival date & time 11/27/14  2108 History  This chart was scribed for Glynn Octave, MD by Abel Presto, ED Scribe. This patient was seen in room APA12/APA12 and the patient's care was started at 10:14 PM.    Chief Complaint  Patient presents with  . Abdominal Pain     Patient is a 24 y.o. female presenting with abdominal pain. The history is provided by the patient. No language interpreter was used.  Abdominal Pain  HPI Comments: Holly Pope is a 24 y.o. female with PMHx of NIDDM and HTN who presents to the Emergency Department complaining of intermittent left lower abdominal pain lasting only a few minutes with onset today. Pt denies any aggravating or alleviating factors, Pt LNMP was in February. Pt is not on bcp. Pt denies h/o abdominal surgeries, nephrolithiasis, and UTIs. Pt has NKDA.  Pt denies chest pain, SOB, fever, dysuria, hematuria, vaginal discharge, vaginal bleeding, nausea, vomiting, and diarrhea.   Past Medical History  Diagnosis Date  . H/O removal of neck cyst   . Morbidly obese   . Hip dislocation, right 05/06/2012  . Hypertension   . Diabetes mellitus without complication    Past Surgical History  Procedure Laterality Date  . Cystectomy  2002    rmoval from neck  . Tooth extraction N/A 11/11/2013    Procedure: EXTRACT 3RD MOLARS PLUS ONE;  Surgeon: Georgia Lopes, DDS;  Location: [facility] OR;  Service: Oral Surgery;  Laterality: N/A;   Family History  Problem Relation Age of Onset  . Heart disease Mother   . Hypertension Mother   . Heart disease Father   . Hypertension Father   . Hypertension Brother    History  Substance Use Topics  . Smoking status: Never Smoker   . Smokeless tobacco: Never Used  . Alcohol Use: No   OB History    Gravida Para Term Preterm AB TAB SAB Ectopic Multiple Living   Review of Systems  Gastrointestinal: Positive for abdominal pain.  A complete 10 system review of systems was  obtained and all systems are negative except as noted in the HPI and PMH.      Allergies  Review of patient's allergies indicates no known allergies.  Home Medications   Prior to Admission medications   Medication Sig Start Date End Date Taking? Authorizing Provider  benazepril (LOTENSIN) 5 MG tablet Take 5 mg by mouth daily.    Historical Provider, MD  Diphenhyd-Hydrocort-Nystatin (FIRST-DUKES MOUTHWASH) SUSP Use as directed 5 mLs in the mouth or throat 4 (four) times daily as needed. 02/19/14   Burgess Amor, PA-C  ibuprofen (ADVIL,MOTRIN) 600 MG tablet Take 1 tablet (600 mg total) by mouth every 6 (six) hours as needed. 02/19/14   Burgess Amor, PA-C  loratadine (CLARITIN) 10 MG tablet Take 1 tablet (10 mg total) by mouth daily. 06/01/14   Hope Orlene Och, NP   BP 122/82 mmHg  Pulse 94  Temp(Src) 98.2 F (36.8 C) (Oral)  Resp 18  Ht  (1.651 m)  Wt 330 lb (149.687 kg)  BMI 54.91 kg/m2  SpO2 97%  LMP 11/18/2014 Physical Exam  Constitutional: She is oriented to person, place, and time. She appears well-developed and well-nourished. No distress.  Flat affect  HENT:  Head: Normocephalic and atraumatic.  Mouth/Throat: Oropharynx is clear and moist. No oropharyngeal exudate.  Eyes: Conjunctivae and EOM are  normal. Pupils are equal, round, and reactive to light.  Neck: Normal range of motion. Neck supple.  No meningismus.  Cardiovascular: Normal rate, regular rhythm, normal heart sounds and intact distal pulses.   No murmur heard. Pulmonary/Chest: Effort normal and breath sounds normal. No respiratory distress.  Abdominal: Soft. There is tenderness. There is no rebound and no guarding.  Obese, TTP LUQ and LLQ. No guarding or rebound  Genitourinary:  Chaperone present, normal external genitalia, no CMT. No lateralizing adnexal tenderness  Musculoskeletal: Normal range of motion. She exhibits no edema or tenderness.  Neurological: She is alert and oriented to person, place, and time. No  cranial nerve deficit. She exhibits normal muscle tone. Coordination normal.  No ataxia on finger to nose bilaterally. No pronator drift. 5/5 strength throughout. CN 2-12 intact. Negative Romberg. Equal grip strength. Sensation intact. Gait is normal.   Skin: Skin is warm.  Psychiatric: She has a normal mood and affect. Her behavior is normal.  Nursing note and vitals reviewed.   ED Course  Procedures (including critical care time) DIAGNOSTIC STUDIES: Oxygen Saturation is 100% on room air, normal by my interpretation.    COORDINATION OF CARE: 10:20 PM Discussed treatment plan with patient at beside, the patient agrees with the plan and has no further questions at this time.   Labs Review Labs Reviewed  WET PREP, GENITAL - Abnormal; Notable for the following:    Yeast Wet Prep HPF POC FEW (*)    WBC, Wet Prep HPF POC MANY (*)    All other components within normal limits  URINALYSIS, ROUTINE W REFLEX MICROSCOPIC - Abnormal; Notable for the following:    APPearance HAZY (*)    Specific Gravity, Urine >1.030 (*)    All other components within normal limits  CBC WITH DIFFERENTIAL/PLATELET - Abnormal; Notable for the following:    WBC 11.1 (*)    Hemoglobin 10.3 (*)    HCT 33.5 (*)    MCV 76.5 (*)    MCH 23.5 (*)    RDW 16.2 (*)    All other components within normal limits  BASIC METABOLIC PANEL - Abnormal; Notable for the following:    Potassium 3.4 (*)    Glucose, Bld 104 (*)    All other components within normal limits  PREGNANCY, URINE  GC/CHLAMYDIA PROBE AMP ([place])    Imaging Review Ct Renal Stone Study  11/28/2014   CLINICAL DATA:  Intermittent left lower abdominal pain beginning today.  EXAM: CT ABDOMEN AND PELVIS WITHOUT CONTRAST  TECHNIQUE: Multidetector CT imaging of the abdomen and pelvis was performed following the standard protocol without IV contrast.  COMPARISON:  11/30/2013  FINDINGS: The visualized lung bases are clear.  The liver, gallbladder, spleen,  adrenal glands, kidneys, and pancreas have an unremarkable unenhanced appearance. No renal calculi, hydronephrosis, ureteral calculi, or ureteral dilatation is seen.  There is no evidence of bowel obstruction. Appendix is unremarkable. Bladder is unremarkable. Uterus and ovaries are identified. No pelvic is seen.  No free fluid or enlarged lymph nodes are identified. Posttraumatic changes are again noted involving the right acetabulum and right femoral head, with osseous fragments noted about the right hip as well as in the medial aspect of the hip joint.  IMPRESSION: No evidence of urinary tract calculi, obstruction, or other acute abnormality.   Electronically Signed   By: Sebastian AcheAllen  Grady   On: 11/28/2014 00:32     EKG Interpretation None     Meds ordered this encounter  Medications  .  fluconazole (DIFLUCAN) tablet 150 mg    Sig:    MDM   Final diagnoses:  Abdominal pain, unspecified abdominal location   Left-sided abdominal pain today without associated symptoms. No nausea, vomiting or diarrhea. No fever.  Urinalysis negative. HCG negative. Pelvic exam benign. Yeast infection treated with diflucan.  CT pending to evaluate for possible kidney stone.  Care transferred to Dr. Lynelle Doctor at shift change.   I personally performed the services described in this documentation, which was scribed in my presence. The recorded information has been reviewed and is accurate.   Glynn Octave, MD 11/28/14 [phone]

## 2014-11-27 NOTE — ED Notes (Signed)
Pt c/o left lower abd pain. Pt denies any n/v/d.

## 2014-11-28 LAB — GC/CHLAMYDIA PROBE AMP (~~LOC~~) NOT AT ARMC
Chlamydia: NEGATIVE
Neisseria Gonorrhea: NEGATIVE

## 2014-11-28 LAB — WET PREP, GENITAL
Clue Cells Wet Prep HPF POC: NONE SEEN
Trich, Wet Prep: NONE SEEN

## 2014-11-28 MED ORDER — FLUCONAZOLE 100 MG PO TABS
150.0000 mg | ORAL_TABLET | Freq: Once | ORAL | Status: AC
  Administered 2014-11-28: 150 mg via ORAL
  Filled 2014-11-28: qty 2

## 2014-11-28 NOTE — Discharge Instructions (Signed)
Abdominal Pain °Your testing is negative for a serious cause of your abdominal pain.  Follow up with your doctor. Return to the ED if you develop new or worsening symptoms. °Many things can cause abdominal pain. Usually, abdominal pain is not caused by a disease and will improve without treatment. It can often be observed and treated at home. Your health care provider will do a physical exam and possibly order blood tests and X-rays to help determine the seriousness of your pain. However, in many cases, more time must pass before a clear cause of the pain can be found. Before that point, your health care provider may not know if you need more testing or further treatment. °HOME CARE INSTRUCTIONS  °Monitor your abdominal pain for any changes. The following actions may help to alleviate any discomfort you are experiencing: °· Only take over-the-counter or prescription medicines as directed by your health care provider. °· Do not take laxatives unless directed to do so by your health care provider. °· Try a clear liquid diet (broth, tea, or water) as directed by your health care provider. Slowly move to a bland diet as tolerated. °SEEK MEDICAL CARE IF: °· You have unexplained abdominal pain. °· You have abdominal pain associated with nausea or diarrhea. °· You have pain when you urinate or have a bowel movement. °· You experience abdominal pain that wakes you in the night. °· You have abdominal pain that is worsened or improved by eating food. °· You have abdominal pain that is worsened with eating fatty foods. °· You have a fever. °SEEK IMMEDIATE MEDICAL CARE IF:  °· Your pain does not go away within 2 hours. °· You keep throwing up (vomiting). °· Your pain is felt only in portions of the abdomen, such as the right side or the left lower portion of the abdomen. °· You pass bloody or black tarry stools. °MAKE SURE YOU: °· Understand these instructions.   °· Will watch your condition.   °· Will get help right away if  you are not doing well or get worse.   °Document Released: 05/04/2005 Document Revised: 07/30/2013 Document Reviewed: 04/03/2013 °Exit[employer]® Patient Information ©2015 [employer]. This information is not intended to replace advice given to you by your health care provider. Make sure you discuss any questions you have with your health care provider. ° °

## 2014-11-28 NOTE — ED Provider Notes (Signed)
Pt left at change of shift to get the results of her CT renal scan. Pt was treated for yeast vaginitis while in the ED. Pt given her test results and didn't have any questions.   Pt is alert and in NAD resting comfortably in her stretcher.   Medications  fluconazole (DIFLUCAN) tablet 150 mg (150 mg Oral Given 11/28/14 0026)      Ct Renal Stone Study  11/28/2014   CLINICAL DATA:  Intermittent left lower abdominal pain beginning today.  EXAM: CT ABDOMEN AND PELVIS WITHOUT CONTRAST  TECHNIQUE: Multidetector CT imaging of the abdomen and pelvis was performed following the standard protocol without IV contrast.  COMPARISON:  11/30/2013  FINDINGS: The visualized lung bases are clear.  The liver, gallbladder, spleen, adrenal glands, kidneys, and pancreas have an unremarkable unenhanced appearance. No renal calculi, hydronephrosis, ureteral calculi, or ureteral dilatation is seen.  There is no evidence of bowel obstruction. Appendix is unremarkable. Bladder is unremarkable. Uterus and ovaries are identified. No pelvic is seen.  No free fluid or enlarged lymph nodes are identified. Posttraumatic changes are again noted involving the right acetabulum and right femoral head, with osseous fragments noted about the right hip as well as in the medial aspect of the hip joint.  IMPRESSION: No evidence of urinary tract calculi, obstruction, or other acute abnormality.   Electronically Signed   By: Sebastian AcheAllen  Grady   On: 11/28/2014 00:32     Diagnoses that have been ruled out:  None  Diagnoses that are still under consideration:  None  Final diagnoses:  Abdominal pain, unspecified abdominal location   Plan discharge  Devoria AlbeIva Kamin Niblack, MD, [facility] PyoFACEP     Zael Shuman, MD 11/28/14 [phone]

## 2015-07-20 ENCOUNTER — Other Ambulatory Visit: Payer: Self-pay | Admitting: Obstetrics and Gynecology

## 2015-07-20 ENCOUNTER — Ambulatory Visit (INDEPENDENT_AMBULATORY_CARE_PROVIDER_SITE_OTHER): Payer: Medicaid Other

## 2015-07-20 ENCOUNTER — Other Ambulatory Visit: Payer: Medicaid Other

## 2015-07-20 DIAGNOSIS — O200 Threatened abortion: Secondary | ICD-10-CM

## 2015-07-20 DIAGNOSIS — O3680X0 Pregnancy with inconclusive fetal viability, not applicable or unspecified: Secondary | ICD-10-CM

## 2015-07-20 DIAGNOSIS — Z3A01 Less than 8 weeks gestation of pregnancy: Secondary | ICD-10-CM

## 2015-07-20 NOTE — Progress Notes (Signed)
US 5+1wks  GS (limited view), no fetal pole or ys seen,1.5 x .9 x 1.6 cm subchorionic hemorrhage,normal ov's bilat,pt is NOT having any pain or bleeding today, labs for today have been ordered and f/u ultrasound in 2 wks ([place]

## 2015-07-21 LAB — BETA HCG QUANT (REF LAB): hCG Quant: 2493 m[IU]/mL

## 2015-07-22 ENCOUNTER — Telehealth: Payer: Self-pay | Admitting: Adult Health

## 2015-07-22 NOTE — Telephone Encounter (Signed)
Pt aware of labs will get US next week, instead of 12/27

## 2015-07-23 ENCOUNTER — Other Ambulatory Visit: Payer: Self-pay | Admitting: Adult Health

## 2015-07-23 DIAGNOSIS — O3680X0 Pregnancy with inconclusive fetal viability, not applicable or unspecified: Secondary | ICD-10-CM

## 2015-07-24 ENCOUNTER — Encounter: Payer: Self-pay | Admitting: Obstetrics and Gynecology

## 2015-07-27 ENCOUNTER — Ambulatory Visit (INDEPENDENT_AMBULATORY_CARE_PROVIDER_SITE_OTHER): Payer: Medicaid Other

## 2015-07-27 ENCOUNTER — Other Ambulatory Visit: Payer: Self-pay | Admitting: Adult Health

## 2015-07-27 DIAGNOSIS — Z3A01 Less than 8 weeks gestation of pregnancy: Secondary | ICD-10-CM

## 2015-07-27 DIAGNOSIS — O3680X0 Pregnancy with inconclusive fetal viability, not applicable or unspecified: Secondary | ICD-10-CM | POA: Diagnosis not present

## 2015-07-27 NOTE — Progress Notes (Signed)
US 5+4 wks single IUP w/ys,pos fht 13 1bpm,normal ov's bilat,crl 2.836mm

## 2015-08-04 ENCOUNTER — Other Ambulatory Visit: Payer: Medicaid Other

## 2015-08-06 ENCOUNTER — Ambulatory Visit (INDEPENDENT_AMBULATORY_CARE_PROVIDER_SITE_OTHER): Payer: Medicaid Other | Admitting: Advanced Practice Midwife

## 2015-08-06 ENCOUNTER — Other Ambulatory Visit (HOSPITAL_COMMUNITY)
Admission: RE | Admit: 2015-08-06 | Discharge: 2015-08-06 | Disposition: A | Discharge status: Home / Self Care | Payer: Medicaid Other | Source: Ambulatory Visit | Attending: Advanced Practice Midwife | Admitting: Advanced Practice Midwife

## 2015-08-06 ENCOUNTER — Encounter: Payer: Self-pay | Admitting: Advanced Practice Midwife

## 2015-08-06 VITALS — BP 130/60 | HR 76

## 2015-08-06 DIAGNOSIS — I10 Essential (primary) hypertension: Secondary | ICD-10-CM

## 2015-08-06 DIAGNOSIS — O09891 Supervision of other high risk pregnancies, first trimester: Secondary | ICD-10-CM

## 2015-08-06 DIAGNOSIS — O10011 Pre-existing essential hypertension complicating pregnancy, first trimester: Secondary | ICD-10-CM | POA: Diagnosis not present

## 2015-08-06 DIAGNOSIS — Z0283 Encounter for blood-alcohol and blood-drug test: Secondary | ICD-10-CM

## 2015-08-06 DIAGNOSIS — Z331 Pregnant state, incidental: Secondary | ICD-10-CM

## 2015-08-06 DIAGNOSIS — E119 Type 2 diabetes mellitus without complications: Secondary | ICD-10-CM

## 2015-08-06 DIAGNOSIS — Z1389 Encounter for screening for other disorder: Secondary | ICD-10-CM

## 2015-08-06 DIAGNOSIS — O24311 Unspecified pre-existing diabetes mellitus in pregnancy, first trimester: Secondary | ICD-10-CM | POA: Diagnosis not present

## 2015-08-06 DIAGNOSIS — O09899 Supervision of other high risk pregnancies, unspecified trimester: Secondary | ICD-10-CM | POA: Insufficient documentation

## 2015-08-06 DIAGNOSIS — Z3A01 Less than 8 weeks gestation of pregnancy: Secondary | ICD-10-CM | POA: Diagnosis not present

## 2015-08-06 DIAGNOSIS — Z113 Encounter for screening for infections with a predominantly sexual mode of transmission: Secondary | ICD-10-CM | POA: Insufficient documentation

## 2015-08-06 DIAGNOSIS — Z8639 Personal history of other endocrine, nutritional and metabolic disease: Secondary | ICD-10-CM

## 2015-08-06 DIAGNOSIS — Z01411 Encounter for gynecological examination (general) (routine) with abnormal findings: Secondary | ICD-10-CM | POA: Diagnosis not present

## 2015-08-06 DIAGNOSIS — Z124 Encounter for screening for malignant neoplasm of cervix: Secondary | ICD-10-CM

## 2015-08-06 DIAGNOSIS — Z369 Encounter for antenatal screening, unspecified: Secondary | ICD-10-CM

## 2015-08-06 LAB — POCT URINALYSIS DIPSTICK
Blood, UA: NEGATIVE
Glucose, UA: NEGATIVE
Ketones, UA: NEGATIVE
Leukocytes, UA: NEGATIVE
Nitrite, UA: NEGATIVE
Protein, UA: NEGATIVE

## 2015-08-06 NOTE — Patient Instructions (Addendum)
Safe Medications in Pregnancy   Acne: Benzoyl Peroxide Salicylic Acid  Backache/Headache: Tylenol: 2 regular strength every 4 hours OR              2 Extra strength every 6 hours  Colds/Coughs/Allergies: Benadryl (alcohol free) 25 mg every 6 hours as needed Breath right strips Claritin Cepacol throat lozenges Chloraseptic throat spray Cold-Eeze- up to three times per day Cough drops, alcohol free Flonase (by prescription only) Guaifenesin Mucinex Robitussin DM (plain only, alcohol free) Saline nasal spray/drops Sudafed (pseudoephedrine) & Actifed ** use only after [redacted] weeks gestation and if you do not have high blood pressure Tylenol Vicks Vaporub Zinc lozenges Zyrtec   Constipation: Colace Ducolax suppositories Fleet enema Glycerin suppositories Metamucil Milk of magnesia Miralax Senokot Smooth move tea  Diarrhea: Kaopectate Imodium A-D  *NO pepto Bismol  Hemorrhoids: Anusol Anusol HC Preparation H Tucks  Indigestion: Tums Maalox Mylanta Zantac  Pepcid  Insomnia: Benadryl (alcohol free) 25mg  every 6 hours as needed Tylenol PM Unisom, no Gelcaps  Leg Cramps: Tums MagGel  Nausea/Vomiting:  Bonine Dramamine Emetrol Ginger extract Sea bands Meclizine  Nausea medication to take during pregnancy:  Unisom (doxylamine succinate 25 mg tablets) Take one tablet daily at bedtime. If symptoms are not adequately controlled, the dose can be increased to a maximum recommended dose of two tablets daily (1/2 tablet in the morning, 1/2 tablet mid-afternoon and one at bedtime). Vitamin B6 100mg  tablets. Take one tablet twice a day (up to 200 mg per day).  Skin Rashes: Aveeno products Benadryl cream or 25mg  every 6 hours as needed Calamine Lotion 1% cortisone cream  Yeast infection: Gyne-lotrimin 7 Monistat 7   **If taking multiple medications, please check labels to avoid duplicating the same active ingredients **take medication as directed on  the label ** Do not exceed 4000 mg of tylenol in 24 hours **Do not take medications that contain aspirin or ibuprofen    Nausea & Vomiting  Have saltine crackers or pretzels by your bed and eat a few bites before you raise your head out of bed in the morning  Eat small frequent meals throughout the day instead of large meals  Drink plenty of fluids throughout the day to stay hydrated, just don't drink a lot of fluids with your meals.  This can make your stomach fill up faster making you feel sick  Do not brush your teeth right after you eat  Products with real ginger are good for nausea, like ginger ale and ginger hard candy Make sure it says made with real ginger!  Sucking on sour candy like lemon heads is also good for nausea  If your prenatal vitamins make you nauseated, take them at night so you will sleep through the nausea  Sea Bands  If you feel like you need medicine for the nausea & vomiting please let us know  If you are unable to keep any fluids or food down please let us know   Take Blood sugar before eating/drinking and 2 hours after meals.

## 2015-08-06 NOTE — Progress Notes (Signed)
  Subjective:    Holly Pope is a G2P1001 2741w0d being seen today for her first obstetrical visit.  Her obstetrical history is significant for SVD X 1 without complications. .  Pregnancy history fully reviewed. 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 .  She stopped all meds a few weeks ago when she found out she was pregnant.   Patient reports fatigue and nausea.  Filed Vitals:   08/06/15 1522  BP: 130/60  Pulse: 76    HISTORY: OB History  Gravida Para Term Preterm AB SAB TAB Ectopic Multiple Living  2 1 1       1     # Outcome Date GA Lbr Len/2nd Weight Sex Delivery Anes PTL Lv  2 Current           1 Term 04/15/12 5332w2d 18:28 / 00:36 7 lb 14.1 oz (3.575 kg) F Vag-Spont Local  Y     Comments: flaring, grunting     Past Medical History  Diagnosis Date  . H/O removal of neck cyst   . Morbidly obese (HCC)   . Hip dislocation, right (HCC) 05/06/2012  . Diabetes mellitus without complication (HCC)   . Hypertension    Past Surgical History  Procedure Laterality Date  . Cystectomy  2002    rmoval from neck  . Tooth extraction N/A 11/11/2013    Procedure: EXTRACT 3RD MOLARS PLUS ONE;  Surgeon: Georgia LopesScott M Jensen, DDS;  Location: [facility] OR;  Service: Oral Surgery;  Laterality: N/A;   Family History  Problem Relation Age of Onset  . Heart disease Mother   . Hypertension Mother   . Heart disease Father   . Hypertension Father   . Hypertension Brother      Exam       Pelvic Exam:    Perineum: Normal Perineum   Vulva: normal   Vagina:  normal mucosa, normal discharge, no palpable nodules   Uterus Normal, Gravid, FH: 7     Cervix: normal   Adnexa: Not palpable   Urinary:  urethral meatus normal    System:     Skin: normal coloration and turgor, no rashes    Neurologic: oriented, normal, normal mood   Extremities: normal strength, tone, and muscle mass   HEENT PERRLA   Mouth/Teeth mucous membranes moist, normal dentition   Neck supple  and no masses   Cardiovascular: regular rate and rhythm   Respiratory:  appears well, vitals normal, no respiratory distress, acyanotic   Abdomen: soft, non-tender;  FHR: 160 US          Assessment:    Pregnancy: G2P1001 Patient Active Problem List   Diagnosis Date Noted  . Supervision of other high-risk pregnancy 08/06/2015  . Diabetes mellitus without complication (HCC)   . Hypertension   . Hip dislocation, right (HCC) 05/06/2012        Plan:     Initial labs drawn. Continue prenatal vitamins  Resume metformin  Will not rx BP med today d/t 130/60 Plan ASA 81mg  >12 weeks Problem list reviewed and updated  Reviewed n/v relief measures and warning s/s to report  Reviewed recommended weight gain based on pre-gravid BMI  Encouraged well-balanced diet Genetic Screening discussed Integrated Screen: declined.  Ultrasound discussed; fetal survey: requested.  Return in about 2 weeks (around 08/20/2015) for HROB. To bring BS log w/QID testing and BP check  CRESENZO-DISHMAN,Amaris Delafuente 08/06/2015

## 2015-08-07 LAB — URINE CULTURE: Organism ID, Bacteria: NO GROWTH

## 2015-08-08 LAB — CBC
Hematocrit: 33.1 % — ABNORMAL LOW (ref 34.0–46.6)
Hemoglobin: 10.7 g/dL — ABNORMAL LOW (ref 11.1–15.9)
MCH: 24.5 pg — ABNORMAL LOW (ref 26.6–33.0)
MCHC: 32.3 g/dL (ref 31.5–35.7)
MCV: 76 fL — ABNORMAL LOW (ref 79–97)
Platelets: 406 10*3/uL — ABNORMAL HIGH (ref 150–379)
RBC: 4.37 x10E6/uL (ref 3.77–5.28)
RDW: 16.2 % — ABNORMAL HIGH (ref 12.3–15.4)
WBC: 13.3 10*3/uL — ABNORMAL HIGH (ref 3.4–10.8)

## 2015-08-08 LAB — VARICELLA ZOSTER ANTIBODY, IGG: Varicella zoster IgG: <135 index — ABNORMAL LOW (ref >165)

## 2015-08-08 LAB — PMP SCREEN PROFILE (10S), URINE
Amphetamine Screen, Ur: NEGATIVE ng/mL
Barbiturate Screen, Ur: NEGATIVE ng/mL
Benzodiazepine Screen, Urine: NEGATIVE ng/mL
Cannabinoids Ur Ql Scn: NEGATIVE ng/mL
Cocaine(Metab.)Screen, Urine: NEGATIVE ng/mL
Creatinine(Crt), U: 143.9 mg/dL (ref 20.0–300.0)
Methadone Scn, Ur: NEGATIVE ng/mL
Opiate Scrn, Ur: NEGATIVE ng/mL
Oxycodone+Oxymorphone Ur Ql Scn: NEGATIVE ng/mL
PCP Scrn, Ur: NEGATIVE ng/mL
Ph of Urine: 5.8 (ref 4.5–8.9)
Propoxyphene, Screen: NEGATIVE ng/mL

## 2015-08-08 LAB — URINALYSIS, ROUTINE W REFLEX MICROSCOPIC
Bilirubin, UA: NEGATIVE
Glucose, UA: NEGATIVE
Ketones, UA: NEGATIVE
Leukocytes, UA: NEGATIVE
Nitrite, UA: NEGATIVE
Protein, UA: NEGATIVE
RBC, UA: NEGATIVE
Specific Gravity, UA: 1.025 (ref 1.005–1.030)
Urobilinogen, Ur: 1 mg/dL (ref 0.2–1.0)
pH, UA: 6.5 (ref 5.0–7.5)

## 2015-08-08 LAB — ABO/RH: Rh Factor: POSITIVE

## 2015-08-08 LAB — ANTIBODY SCREEN: Antibody Screen: NEGATIVE

## 2015-08-08 LAB — HEPATITIS B SURFACE ANTIGEN: Hepatitis B Surface Ag: NEGATIVE

## 2015-08-08 LAB — HEMOGLOBIN A1C
Est. average glucose Bld gHb Est-mCnc: 117 mg/dL
Hgb A1c MFr Bld: 5.7 % — ABNORMAL HIGH (ref 4.8–5.6)

## 2015-08-08 LAB — RUBELLA SCREEN: Rubella Antibodies, IGG: <0.9 index — ABNORMAL LOW (ref >0.99)

## 2015-08-08 LAB — RPR: RPR Ser Ql: NONREACTIVE

## 2015-08-08 LAB — HIV ANTIBODY (ROUTINE TESTING W REFLEX): HIV Screen 4th Generation wRfx: NONREACTIVE

## 2015-08-11 LAB — CYTOLOGY - PAP

## 2015-08-17 ENCOUNTER — Encounter: Payer: Self-pay | Admitting: Adult Health

## 2015-08-17 ENCOUNTER — Ambulatory Visit (INDEPENDENT_AMBULATORY_CARE_PROVIDER_SITE_OTHER): Payer: Medicaid Other | Admitting: Adult Health

## 2015-08-17 ENCOUNTER — Telehealth: Payer: Self-pay | Admitting: Women's Health

## 2015-08-17 VITALS — BP 128/80 | HR 86 | Wt 289.0 lb

## 2015-08-17 DIAGNOSIS — Z331 Pregnant state, incidental: Secondary | ICD-10-CM | POA: Diagnosis not present

## 2015-08-17 DIAGNOSIS — K5900 Constipation, unspecified: Secondary | ICD-10-CM | POA: Insufficient documentation

## 2015-08-17 DIAGNOSIS — Z1389 Encounter for screening for other disorder: Secondary | ICD-10-CM

## 2015-08-17 DIAGNOSIS — O209 Hemorrhage in early pregnancy, unspecified: Secondary | ICD-10-CM

## 2015-08-17 HISTORY — DX: Constipation, unspecified: K59.00

## 2015-08-17 LAB — POCT URINALYSIS DIPSTICK
Glucose, UA: NEGATIVE
Ketones, UA: NEGATIVE
Leukocytes, UA: NEGATIVE
Nitrite, UA: NEGATIVE
Protein, UA: NEGATIVE

## 2015-08-17 NOTE — Progress Notes (Signed)
US showed IUP with YS and +FHM 176 no bleeding in vaginal vault.

## 2015-08-17 NOTE — Patient Instructions (Signed)
No sex til it's been 7 days with no color when wipes  Try colace and increase water, apples,oranges and grapes, prunes  Follow up 1/12 with Dr Despina HiddenEure

## 2015-08-17 NOTE — Progress Notes (Signed)
G2P1001 2776w4d Estimated Date of Delivery: 03/24/16  Work in appt Blood pressure 128/80, pulse 86, weight 289 lb (131.09 kg).   BP weight and urine results all reviewed and noted.  Please refer to the obstetrical flow sheet for the fundal height and fetal heart rate documentation:  Patient denies any rupture of membranes symptoms or regular contractions.Has had vaginal bleeding bleeding on and off til this morning, some cramps, may be started after BM has had some constipation. All questions were answered.  Orders Placed This Encounter  Procedures  . POCT Urinalysis Dipstick    Plan:  Continued routine obstetrical care, no sex, try colace and increased water  Return 1/12 with Dr Despina HiddenEure as scheduled

## 2015-08-17 NOTE — Telephone Encounter (Signed)
Pt c/o light vaginal bleeding early pregnancy starting Saturday night. Pt given an appt today for evaluation.

## 2015-08-20 ENCOUNTER — Encounter: Payer: Self-pay | Admitting: Obstetrics & Gynecology

## 2015-08-20 ENCOUNTER — Ambulatory Visit (INDEPENDENT_AMBULATORY_CARE_PROVIDER_SITE_OTHER): Payer: Medicaid Other | Admitting: Obstetrics & Gynecology

## 2015-08-20 VITALS — BP 120/80 | HR 100 | Wt 292.4 lb

## 2015-08-20 DIAGNOSIS — O209 Hemorrhage in early pregnancy, unspecified: Secondary | ICD-10-CM

## 2015-08-20 DIAGNOSIS — Z1389 Encounter for screening for other disorder: Secondary | ICD-10-CM

## 2015-08-20 DIAGNOSIS — Z331 Pregnant state, incidental: Secondary | ICD-10-CM | POA: Diagnosis not present

## 2015-08-20 DIAGNOSIS — O24419 Gestational diabetes mellitus in pregnancy, unspecified control: Secondary | ICD-10-CM | POA: Diagnosis not present

## 2015-08-20 DIAGNOSIS — O09891 Supervision of other high risk pregnancies, first trimester: Secondary | ICD-10-CM

## 2015-08-20 DIAGNOSIS — IMO0001 Reserved for inherently not codable concepts without codable children: Secondary | ICD-10-CM

## 2015-08-20 LAB — POCT URINALYSIS DIPSTICK
Blood, UA: 3
Glucose, UA: NEGATIVE
Ketones, UA: NEGATIVE
Leukocytes, UA: NEGATIVE
Nitrite, UA: NEGATIVE

## 2015-08-20 MED ORDER — GLYBURIDE 2.5 MG PO TABS: ORAL_TABLET | ORAL | Status: DC

## 2015-08-20 NOTE — Progress Notes (Signed)
Fetal Surveillance Testing today:     High Risk Pregnancy Diagnosis(es):   Class B DM  G2P1001 7456w0d Estimated Date of Delivery: 03/24/16  Blood pressure 120/80, pulse 100, weight 292 lb 6.4 oz (132.632 kg).  Urinalysis: Negative   HPI: The patient is being seen today for ongoing management of Class B DM. Today she reports CBG suboptimal   BP weight and urine results all reviewed and noted. Patient reports good fetal movement, denies any bleeding and no rupture of membranes symptoms or regular contractions.  Fundal Height:  na Fetal Heart rate:  na Edema:  none  Patient is without complaints other than noted in her HPI. All questions were answered.  All lab and sonogram results have been reviewed. Comments:    Assessment:  1.  Pregnancy at 2556w0d,  Estimated Date of Delivery: 03/24/16 :                          2.  Class B DM                        3.    Medication(s) Plans:  Begin oral hypoglycemic with glyburide 2.5 qhs  Treatment Plan:  As above and per protocol  No Follow-up on file. for appointment for high risk OB care  Meds ordered this encounter  Medications  . glyBURIDE (DIABETA) 2.5 MG tablet    Sig: Take 1 tablet at bedtime    Dispense:  30 tablet    Refill:  3   Orders Placed This Encounter  Procedures  . POCT urinalysis dipstick

## 2015-09-02 ENCOUNTER — Other Ambulatory Visit: Payer: Self-pay | Admitting: Obstetrics & Gynecology

## 2015-09-02 DIAGNOSIS — O209 Hemorrhage in early pregnancy, unspecified: Secondary | ICD-10-CM

## 2015-09-02 DIAGNOSIS — IMO0001 Reserved for inherently not codable concepts without codable children: Secondary | ICD-10-CM

## 2015-09-03 ENCOUNTER — Ambulatory Visit (INDEPENDENT_AMBULATORY_CARE_PROVIDER_SITE_OTHER): Payer: Medicaid Other | Admitting: Obstetrics & Gynecology

## 2015-09-03 ENCOUNTER — Encounter: Payer: Self-pay | Admitting: Obstetrics & Gynecology

## 2015-09-03 ENCOUNTER — Other Ambulatory Visit: Payer: Self-pay | Admitting: Obstetrics & Gynecology

## 2015-09-03 ENCOUNTER — Ambulatory Visit (INDEPENDENT_AMBULATORY_CARE_PROVIDER_SITE_OTHER): Payer: Medicaid Other

## 2015-09-03 VITALS — BP 120/70 | HR 76 | Wt 285.0 lb

## 2015-09-03 DIAGNOSIS — Z3A11 11 weeks gestation of pregnancy: Secondary | ICD-10-CM

## 2015-09-03 DIAGNOSIS — Z331 Pregnant state, incidental: Secondary | ICD-10-CM | POA: Diagnosis not present

## 2015-09-03 DIAGNOSIS — O24419 Gestational diabetes mellitus in pregnancy, unspecified control: Secondary | ICD-10-CM

## 2015-09-03 DIAGNOSIS — Z1389 Encounter for screening for other disorder: Secondary | ICD-10-CM | POA: Diagnosis not present

## 2015-09-03 DIAGNOSIS — O209 Hemorrhage in early pregnancy, unspecified: Secondary | ICD-10-CM

## 2015-09-03 DIAGNOSIS — IMO0001 Reserved for inherently not codable concepts without codable children: Secondary | ICD-10-CM

## 2015-09-03 DIAGNOSIS — O09891 Supervision of other high risk pregnancies, first trimester: Secondary | ICD-10-CM | POA: Diagnosis not present

## 2015-09-03 LAB — POCT URINALYSIS DIPSTICK
Blood, UA: NEGATIVE
Glucose, UA: NEGATIVE
Ketones, UA: NEGATIVE
Leukocytes, UA: NEGATIVE
Nitrite, UA: NEGATIVE
Protein, UA: NEGATIVE

## 2015-09-03 NOTE — Progress Notes (Signed)
US TV: 11wks,measurements c/w dates,normal ov's bilat,crl 52.39mm,fhr 165 bpm,

## 2015-09-03 NOTE — Progress Notes (Signed)
Fetal Surveillance Testing today:  Sonogram normal(pt declines NT/IT)  High Risk Pregnancy Diagnosis(es):   Class B DM  G2P1001 [redacted]w[redacted]d Estimated Date of Delivery: 03/24/16  Blood pressure 120/70, pulse 76, weight 285 lb (129.275 kg).  Urinalysis: Negative   HPI: The patient is being seen today for ongoing management of class B DM. Today she reports CBG are excellent   BP weight and urine results all reviewed and noted. Patient reports good fetal movement, denies any bleeding and no rupture of membranes symptoms or regular contractions.  Fundal Height:   Fetal Heart rate:  165 Edema:    Patient is without complaints other than noted in her HPI. All questions were answered.  All lab and sonogram results have been reviewed. Comments:    Assessment:  1.  Pregnancy at [redacted]w[redacted]d,  Estimated Date of Delivery: 03/24/16 :                          2.  Class B DM                        3.    Medication(s) Plans:  Glyburide 2.5 mg hs + metformin 1000 BID  Treatment Plan:    No Follow-up on file. for appointment for high risk OB care  No orders of the defined types were placed in this encounter.   Orders Placed This Encounter  Procedures  . POCT urinalysis dipstick

## 2015-10-01 ENCOUNTER — Encounter: Payer: Self-pay | Admitting: Obstetrics & Gynecology

## 2015-10-01 ENCOUNTER — Ambulatory Visit (INDEPENDENT_AMBULATORY_CARE_PROVIDER_SITE_OTHER): Payer: Medicaid Other | Admitting: Obstetrics & Gynecology

## 2015-10-01 VITALS — BP 140/70 | HR 88 | Wt 293.4 lb

## 2015-10-01 DIAGNOSIS — O99211 Obesity complicating pregnancy, first trimester: Secondary | ICD-10-CM | POA: Diagnosis not present

## 2015-10-01 DIAGNOSIS — IMO0001 Reserved for inherently not codable concepts without codable children: Secondary | ICD-10-CM

## 2015-10-01 DIAGNOSIS — Z1389 Encounter for screening for other disorder: Secondary | ICD-10-CM | POA: Diagnosis not present

## 2015-10-01 DIAGNOSIS — O09891 Supervision of other high risk pregnancies, first trimester: Secondary | ICD-10-CM

## 2015-10-01 DIAGNOSIS — Z331 Pregnant state, incidental: Secondary | ICD-10-CM

## 2015-10-01 DIAGNOSIS — O24419 Gestational diabetes mellitus in pregnancy, unspecified control: Secondary | ICD-10-CM

## 2015-10-01 DIAGNOSIS — Z3A15 15 weeks gestation of pregnancy: Secondary | ICD-10-CM | POA: Diagnosis not present

## 2015-10-01 LAB — POCT URINALYSIS DIPSTICK
Blood, UA: NEGATIVE
Glucose, UA: NEGATIVE
Ketones, UA: NEGATIVE
Leukocytes, UA: NEGATIVE
Nitrite, UA: NEGATIVE
Protein, UA: NEGATIVE

## 2015-10-01 NOTE — Progress Notes (Signed)
Fetal Surveillance Testing today:  FHR 160   High Risk Pregnancy Diagnosis(es):   Class B DM  G2P1001 [redacted]w[redacted]d Estimated Date of Delivery: 03/24/16  Blood pressure 140/70, pulse 88, weight 293 lb 6.4 oz (133.085 kg).  Urinalysis: Negative   HPI: The patient is being seen today for ongoing management of class B DM. Today she reports CBG are great   BP weight and urine results all reviewed and noted. Patient reports good fetal movement, denies any bleeding and no rupture of membranes symptoms or regular contractions.  Fundal Height:  ? Fetal Heart rate:  160 Edema:  none  Patient is without complaints other than noted in her HPI. All questions were answered.  All lab and sonogram results have been reviewed. Comments:    Assessment:  1.  Pregnancy at 100w0d,  Estimated Date of Delivery: 03/24/16 :                          2.  Class B DM                        3.  Morbid obesity  Medication(s) Plans:  Metformin 1000 bid + glyburide 2.5 qhs  Treatment Plan:  Per protocol, sonogram 4 weeks  No Follow-up on file. for appointment for high risk OB care  No orders of the defined types were placed in this encounter.   Orders Placed This Encounter  Procedures  . POCT urinalysis dipstick

## 2015-10-19 ENCOUNTER — Telehealth: Payer: Self-pay | Admitting: Women's Health

## 2015-10-19 MED ORDER — PRENATAL PLUS 27-1 MG PO TABS: ORAL_TABLET | ORAL | Status: DC

## 2015-10-19 NOTE — Telephone Encounter (Signed)
Refill for PNV completed per pt request.

## 2015-10-29 ENCOUNTER — Ambulatory Visit (INDEPENDENT_AMBULATORY_CARE_PROVIDER_SITE_OTHER): Payer: Medicaid Other

## 2015-10-29 ENCOUNTER — Ambulatory Visit (INDEPENDENT_AMBULATORY_CARE_PROVIDER_SITE_OTHER): Payer: Medicaid Other | Admitting: Advanced Practice Midwife

## 2015-10-29 ENCOUNTER — Other Ambulatory Visit: Payer: Self-pay | Admitting: Obstetrics & Gynecology

## 2015-10-29 VITALS — BP 120/68 | HR 92 | Wt 293.0 lb

## 2015-10-29 DIAGNOSIS — O09892 Supervision of other high risk pregnancies, second trimester: Secondary | ICD-10-CM

## 2015-10-29 DIAGNOSIS — Z36 Encounter for antenatal screening of mother: Secondary | ICD-10-CM

## 2015-10-29 DIAGNOSIS — E119 Type 2 diabetes mellitus without complications: Secondary | ICD-10-CM

## 2015-10-29 DIAGNOSIS — O24419 Gestational diabetes mellitus in pregnancy, unspecified control: Secondary | ICD-10-CM | POA: Diagnosis not present

## 2015-10-29 DIAGNOSIS — Z1389 Encounter for screening for other disorder: Secondary | ICD-10-CM

## 2015-10-29 DIAGNOSIS — Z331 Pregnant state, incidental: Secondary | ICD-10-CM | POA: Diagnosis not present

## 2015-10-29 DIAGNOSIS — IMO0001 Reserved for inherently not codable concepts without codable children: Secondary | ICD-10-CM

## 2015-10-29 DIAGNOSIS — I10 Essential (primary) hypertension: Secondary | ICD-10-CM

## 2015-10-29 LAB — POCT URINALYSIS DIPSTICK
Blood, UA: NEGATIVE
Glucose, UA: NEGATIVE
Ketones, UA: NEGATIVE
Leukocytes, UA: NEGATIVE
Nitrite, UA: NEGATIVE
Protein, UA: NEGATIVE

## 2015-10-29 MED ORDER — ASPIRIN EC 81 MG PO TBEC: 81.0000 mg | DELAYED_RELEASE_TABLET | Freq: Every day | ORAL | Status: DC

## 2015-10-29 NOTE — Progress Notes (Signed)
US 19 wks,cephalic,post pl gr 0,cx 4 cm,svp of fluid,bilat adnexa's wnl,fhr 154cm,efw 288 g,limited ultrasound because of pt body habitus,please have pt come back for additional images.( recheck heart,head,CI,gender)

## 2015-10-29 NOTE — Progress Notes (Signed)
Fetal Surveillance Testing today:  US   High Risk Pregnancy Diagnosis(es):   Class B DM, CHTN (off meds since early pregnancy)  G2P1001 5326w0d Estimated Date of Delivery: 03/24/16  Blood pressure 120/68, pulse 92, weight 293 lb (132.904 kg).  Urinalysis: Negative   HPI: The patient is being seen today for ongoing management of Class B DM, CHTN. Today she reports no complaints.  Blood sugars good--all PP in range, a few fastings 91-100.    BP weight and urine results all reviewed and noted. Patient reports good fetal movement, denies any bleeding and no rupture of membranes symptoms or regular contractions.  Fundal Height:  ? Fetal Heart rate:  154 Edema:  no  Patient is without complaints other than noted in her HPI. All questions were answered.  All lab and sonogram results have been reviewed. Comments:  US 19 wks,cephalic,post pl gr 0,cx 4 cm,svp of fluid,bilat adnexa's wnl,fhr 154cm,efw 288 g,limited ultrasound because of pt body habitus,please have pt come back for additional images.( recheck heart,head,CI,gender)  Assessment:  1.  Pregnancy at 7526w0d,  Estimated Date of Delivery: 03/24/16 :                          2.  Class B DM, good control                        3.  CHTN, stable off meds  Medication(s) Plans:  Continue glyburide 2.5mg  qhs and metformin 1000mg  BID; continue ASA 81mg  daily  Treatment Plan:  Monthly visits until 28 week, start twice weekly testing >32 weeks, growht US q 4 weeks >20 weeks  Return in about 4 weeks (around 11/26/2015) for HROB, US:EFW. for appointment for high risk OB care  Meds ordered this encounter  Medications  . aspirin EC 81 MG tablet    Sig: Take 1 tablet (81 mg total) by mouth daily.    Dispense:  30 tablet    Refill:  6    Order Specific Question:  Supervising Provider    Answer:  Duane LopeEURE, LUTHER H [[id number]]   Orders Placed This Encounter  Procedures  . US OB Follow Up  . POCT urinalysis dipstick

## 2015-11-24 ENCOUNTER — Other Ambulatory Visit: Payer: Self-pay | Admitting: Advanced Practice Midwife

## 2015-11-24 DIAGNOSIS — E119 Type 2 diabetes mellitus without complications: Secondary | ICD-10-CM

## 2015-11-24 DIAGNOSIS — IMO0002 Reserved for concepts with insufficient information to code with codable children: Secondary | ICD-10-CM

## 2015-11-24 DIAGNOSIS — O09892 Supervision of other high risk pregnancies, second trimester: Secondary | ICD-10-CM

## 2015-11-24 DIAGNOSIS — I10 Essential (primary) hypertension: Secondary | ICD-10-CM

## 2015-11-24 DIAGNOSIS — Z0489 Encounter for examination and observation for other specified reasons: Secondary | ICD-10-CM

## 2015-11-26 ENCOUNTER — Ambulatory Visit (INDEPENDENT_AMBULATORY_CARE_PROVIDER_SITE_OTHER): Payer: Medicaid Other

## 2015-11-26 ENCOUNTER — Ambulatory Visit (INDEPENDENT_AMBULATORY_CARE_PROVIDER_SITE_OTHER): Payer: Medicaid Other | Admitting: Advanced Practice Midwife

## 2015-11-26 VITALS — BP 140/96 | HR 104 | Wt 296.0 lb

## 2015-11-26 DIAGNOSIS — Z331 Pregnant state, incidental: Secondary | ICD-10-CM

## 2015-11-26 DIAGNOSIS — Z0489 Encounter for examination and observation for other specified reasons: Secondary | ICD-10-CM

## 2015-11-26 DIAGNOSIS — O09892 Supervision of other high risk pregnancies, second trimester: Secondary | ICD-10-CM | POA: Diagnosis not present

## 2015-11-26 DIAGNOSIS — O24419 Gestational diabetes mellitus in pregnancy, unspecified control: Secondary | ICD-10-CM | POA: Diagnosis not present

## 2015-11-26 DIAGNOSIS — I10 Essential (primary) hypertension: Secondary | ICD-10-CM

## 2015-11-26 DIAGNOSIS — Z1389 Encounter for screening for other disorder: Secondary | ICD-10-CM

## 2015-11-26 DIAGNOSIS — E119 Type 2 diabetes mellitus without complications: Secondary | ICD-10-CM

## 2015-11-26 DIAGNOSIS — IMO0002 Reserved for concepts with insufficient information to code with codable children: Secondary | ICD-10-CM

## 2015-11-26 LAB — POCT URINALYSIS DIPSTICK
Glucose, UA: NEGATIVE
Leukocytes, UA: NEGATIVE
Nitrite, UA: NEGATIVE
Protein, UA: NEGATIVE

## 2015-11-26 MED ORDER — LABETALOL HCL 100 MG PO TABS: 200.0000 mg | ORAL_TABLET | Freq: Two times a day (BID) | ORAL | Status: DC

## 2015-11-26 NOTE — Patient Instructions (Signed)
   Tdap Vaccine  It is recommended that you get the Tdap vaccine during the third trimester of EACH pregnancy to help protect your baby from getting pertussis (whooping cough)  27-36 weeks is the BEST time to do this so that you can pass the protection on to your baby. During pregnancy is better than after pregnancy, but if you are unable to get it during pregnancy it will be offered at the hospital.  You can get this vaccine at the health department or your family doctor, as well as some pharmacies.  Everyone who will be around your baby should also be up-to-date on their vaccines. Adults (who are not pregnant) only need 1 dose of Tdap during adulthood.

## 2015-11-26 NOTE — Progress Notes (Signed)
US 23 wks,breech,cx 3.4 cm,post pl,svp of fluid 7cm,fhr 158 bpm,efw 650 g 61%,unable to see upper lip, limited ultrasound because of pt body habitus.

## 2015-11-26 NOTE — Progress Notes (Signed)
Fetal Surveillance Testing today:  Holly Pope   High Risk Pregnancy Diagnosis(es):   CHTN (quit meds w/+ UPT), Class B DM  G2P1001 5181w0d Estimated Date of Delivery: 03/24/16  There were no vitals taken for this visit.  Urinalysis: Negative   HPI: The patient is being seen today for ongoing management of Class B DM, CHTN (off meds). Today she reports FBS <90 and 2hr PP all around 100.  :)    BP weight and urine results all reviewed and noted. Patient reports good fetal movement, denies any bleeding and no rupture of membranes symptoms or regular contractions.  Fundal Height:  EFW:  61% Fetal Heart rate:  158 Edema:  trace  Patient is without complaints other than noted in her HPI. All questions were answered.  All lab and sonogram results have been reviewed. Comments:   Holly Pope 23 wks,breech,cx 3.4 cm,post pl,svp of fluid 7cm,fhr 158 bpm,efw 650 g 61%,unable to see upper lip, limited ultrasound because of pt body habitus.         Assessment:  1.  Pregnancy at 5281w0d,  Estimated Date of Delivery: 03/24/16 :                          2.  Class B DM:  excellent control                        3.  CHTN, increasing BP  Medication(s) Plans:  Continue ASA 81mg  daily; Continue Glyburide 2.5 mg qhs and metformin 1000mg  BID, will start Labetalol 100mg  BID today  Treatment Plan:  Monthly growth us; twice weekly testing >32 weeks, IOL 39 weeks  Return in about 4 weeks (around 12/24/2015) for HROB, US:OB F/U:and PN2 labs,NO GTT. for appointment for high risk OB care  No orders of the defined types were placed in this encounter.   Orders Placed This Encounter  Procedures  . POCT urinalysis dipstick

## 2015-12-23 ENCOUNTER — Other Ambulatory Visit: Payer: Self-pay | Admitting: Advanced Practice Midwife

## 2015-12-23 DIAGNOSIS — O10913 Unspecified pre-existing hypertension complicating pregnancy, third trimester: Secondary | ICD-10-CM

## 2015-12-23 DIAGNOSIS — O24913 Unspecified diabetes mellitus in pregnancy, third trimester: Secondary | ICD-10-CM

## 2015-12-24 ENCOUNTER — Ambulatory Visit (INDEPENDENT_AMBULATORY_CARE_PROVIDER_SITE_OTHER): Payer: Medicaid Other | Admitting: Obstetrics & Gynecology

## 2015-12-24 ENCOUNTER — Ambulatory Visit (INDEPENDENT_AMBULATORY_CARE_PROVIDER_SITE_OTHER): Payer: Medicaid Other

## 2015-12-24 ENCOUNTER — Other Ambulatory Visit: Payer: Medicaid Other

## 2015-12-24 ENCOUNTER — Encounter: Payer: Self-pay | Admitting: Obstetrics & Gynecology

## 2015-12-24 VITALS — BP 110/60 | HR 118 | Wt 300.0 lb

## 2015-12-24 DIAGNOSIS — O24913 Unspecified diabetes mellitus in pregnancy, third trimester: Secondary | ICD-10-CM

## 2015-12-24 DIAGNOSIS — O10013 Pre-existing essential hypertension complicating pregnancy, third trimester: Secondary | ICD-10-CM | POA: Diagnosis not present

## 2015-12-24 DIAGNOSIS — O24419 Gestational diabetes mellitus in pregnancy, unspecified control: Secondary | ICD-10-CM | POA: Diagnosis not present

## 2015-12-24 DIAGNOSIS — O09892 Supervision of other high risk pregnancies, second trimester: Secondary | ICD-10-CM

## 2015-12-24 DIAGNOSIS — Z331 Pregnant state, incidental: Secondary | ICD-10-CM

## 2015-12-24 DIAGNOSIS — Z1389 Encounter for screening for other disorder: Secondary | ICD-10-CM

## 2015-12-24 DIAGNOSIS — O10913 Unspecified pre-existing hypertension complicating pregnancy, third trimester: Secondary | ICD-10-CM

## 2015-12-24 DIAGNOSIS — Z369 Encounter for antenatal screening, unspecified: Secondary | ICD-10-CM

## 2015-12-24 DIAGNOSIS — E119 Type 2 diabetes mellitus without complications: Secondary | ICD-10-CM

## 2015-12-24 LAB — POCT URINALYSIS DIPSTICK
Blood, UA: NEGATIVE
Glucose, UA: NEGATIVE
Leukocytes, UA: NEGATIVE
Nitrite, UA: NEGATIVE
Protein, UA: NEGATIVE

## 2015-12-24 NOTE — Progress Notes (Signed)
Fetal Surveillance Testing today:  Sonogram normal with normal facial anatomy specifically, normal growth   High Risk Pregnancy Diagnosis(es):   Class B DM, Chronic Hypertension  G2P1001 1967w0d Estimated Date of Delivery: 03/24/16  Blood pressure 110/60, pulse 118, weight 300 lb (136.079 kg).  Urinalysis: Negative   HPI: The patient is being seen today for ongoing management of as above. Today she reports CBG are excellent, encouraged to perform BP measurements at home   BP weight and urine results all reviewed and noted. Patient reports good fetal movement, denies any bleeding and no rupture of membranes symptoms or regular contractions.  Fundal Height:  32 Fetal Heart rate:  142 Edema:  none  Patient is without complaints other than noted in her HPI. All questions were answered.  All lab and sonogram results have been reviewed. Comments: normal   Assessment:  1.  Pregnancy at 4667w0d,  Estimated Date of Delivery: 03/24/16 :                          2.  Class B DM                         3.  Chronic hypertension  Medication(s) Plans:  Continue labetalol 100 BID, glybruide 2.5 hs, metformin 1000 BID, baby ASA  Treatment Plan:  At 32 weeks begin Twice weekly surveillance, sonogram alternating with NST, induction at 39 weeks or as clinically indicated   No Follow-up on file. for appointment for high risk OB care  No orders of the defined types were placed in this encounter.   Orders Placed This Encounter  Procedures  . POCT urinalysis dipstick

## 2015-12-24 NOTE — Progress Notes (Signed)
US 27 wks,post pl gr 0,bilat adnexa's wnl,cephalic,cx 3.7 cm,afi 15.4 cm,fhr 142 bpm,efw 1276 78%, AC 95%,facial anatomy complete

## 2015-12-25 LAB — ANTIBODY SCREEN: Antibody Screen: NEGATIVE

## 2015-12-25 LAB — CBC
Hematocrit: 33.1 % — ABNORMAL LOW (ref 34.0–46.6)
Hemoglobin: 10.8 g/dL — ABNORMAL LOW (ref 11.1–15.9)
MCH: 25.8 pg — ABNORMAL LOW (ref 26.6–33.0)
MCHC: 32.6 g/dL (ref 31.5–35.7)
MCV: 79 fL (ref 79–97)
Platelets: 329 10*3/uL (ref 150–379)
RBC: 4.18 x10E6/uL (ref 3.77–5.28)
RDW: 16.9 % — ABNORMAL HIGH (ref 12.3–15.4)
WBC: 15.1 10*3/uL — ABNORMAL HIGH (ref 3.4–10.8)

## 2015-12-25 LAB — HIV ANTIBODY (ROUTINE TESTING W REFLEX): HIV Screen 4th Generation wRfx: NONREACTIVE

## 2015-12-25 LAB — RPR: RPR Ser Ql: NONREACTIVE

## 2016-01-06 ENCOUNTER — Other Ambulatory Visit: Payer: Self-pay | Admitting: Obstetrics & Gynecology

## 2016-01-07 ENCOUNTER — Ambulatory Visit (INDEPENDENT_AMBULATORY_CARE_PROVIDER_SITE_OTHER): Payer: Medicaid Other | Admitting: Obstetrics & Gynecology

## 2016-01-07 ENCOUNTER — Encounter: Payer: Self-pay | Admitting: Obstetrics & Gynecology

## 2016-01-07 VITALS — BP 90/60 | HR 106 | Wt 301.0 lb

## 2016-01-07 DIAGNOSIS — Z1389 Encounter for screening for other disorder: Secondary | ICD-10-CM

## 2016-01-07 DIAGNOSIS — IMO0001 Reserved for inherently not codable concepts without codable children: Secondary | ICD-10-CM

## 2016-01-07 DIAGNOSIS — O09893 Supervision of other high risk pregnancies, third trimester: Secondary | ICD-10-CM

## 2016-01-07 DIAGNOSIS — O10913 Unspecified pre-existing hypertension complicating pregnancy, third trimester: Secondary | ICD-10-CM

## 2016-01-07 DIAGNOSIS — O24419 Gestational diabetes mellitus in pregnancy, unspecified control: Secondary | ICD-10-CM

## 2016-01-07 DIAGNOSIS — Z331 Pregnant state, incidental: Secondary | ICD-10-CM

## 2016-01-07 LAB — POCT URINALYSIS DIPSTICK
Blood, UA: NEGATIVE
Glucose, UA: NEGATIVE
Ketones, UA: NEGATIVE
Leukocytes, UA: NEGATIVE
Nitrite, UA: NEGATIVE

## 2016-01-07 NOTE — Progress Notes (Signed)
Fetal Surveillance Testing today:  FHR 148   High Risk Pregnancy Diagnosis(es):   Class B DM, Chronic hypertension  G2P1001 5051w0d Estimated Date of Delivery: 03/24/16  Blood pressure 90/60, pulse 106, weight 301 lb (136.533 kg).  Urinalysis: Negative   HPI: The patient is being seen today for ongoing management of as bove. Today she reports CBG are excellent   BP weight and urine results all reviewed and noted. Patient reports good fetal movement, denies any bleeding and no rupture of membranes symptoms or regular contractions.  Fundal Height:  U+23 Fetal Heart rate:  148 Edema:  1+  Patient is without complaints other than noted in her HPI. All questions were answered.  All lab and sonogram results have been reviewed. Comments: abnormal:    Assessment:  1.  Pregnancy at 9151w0d,  Estimated Date of Delivery: 03/24/16 :                          2.  Class B DM                        3.  Chronic hypertension  Medication(s) Plans:  No changes, glyburide 2.5 qhs and metformin 1000 BID, labetalol 100 BID, baby ASA  Treatment Plan:  Begin twice weekly surveillance at 32 weeks  Return in about 2 weeks (around 01/21/2016) for HROB. for appointment for high risk OB care  No orders of the defined types were placed in this encounter.   Orders Placed This Encounter  Procedures  . POCT urinalysis dipstick

## 2016-01-21 ENCOUNTER — Ambulatory Visit (INDEPENDENT_AMBULATORY_CARE_PROVIDER_SITE_OTHER): Payer: Medicaid Other | Admitting: Obstetrics & Gynecology

## 2016-01-21 VITALS — BP 120/70 | HR 98 | Wt 303.0 lb

## 2016-01-21 DIAGNOSIS — Z1389 Encounter for screening for other disorder: Secondary | ICD-10-CM

## 2016-01-21 DIAGNOSIS — O10913 Unspecified pre-existing hypertension complicating pregnancy, third trimester: Secondary | ICD-10-CM | POA: Diagnosis not present

## 2016-01-21 DIAGNOSIS — Z3A31 31 weeks gestation of pregnancy: Secondary | ICD-10-CM | POA: Diagnosis not present

## 2016-01-21 DIAGNOSIS — IMO0001 Reserved for inherently not codable concepts without codable children: Secondary | ICD-10-CM

## 2016-01-21 DIAGNOSIS — O09893 Supervision of other high risk pregnancies, third trimester: Secondary | ICD-10-CM

## 2016-01-21 DIAGNOSIS — Z331 Pregnant state, incidental: Secondary | ICD-10-CM | POA: Diagnosis not present

## 2016-01-21 DIAGNOSIS — O24419 Gestational diabetes mellitus in pregnancy, unspecified control: Secondary | ICD-10-CM | POA: Diagnosis not present

## 2016-01-21 LAB — POCT URINALYSIS DIPSTICK
Blood, UA: NEGATIVE
Glucose, UA: 1
Leukocytes, UA: NEGATIVE
Nitrite, UA: NEGATIVE

## 2016-01-21 NOTE — Progress Notes (Signed)
Fetal Surveillance Testing today:  FHR   High Risk Pregnancy Diagnosis(es):   Class B DM, chronic hypertension  G2P1001 2662w0d Estimated Date of Delivery: 03/24/16  Blood pressure 120/70, pulse 98, weight 303 lb (137.44 kg).  Urinalysis: Negative   HPI: The patient is being seen today for ongoing management of as above. Today she reports CBG are excellent as usual   BP weight and urine results all reviewed and noted. Patient reports good fetal movement, denies any bleeding and no rupture of membranes symptoms or regular contractions.  Fundal Height:  U+28 cm Fetal Heart rate:  150 Edema:  1+  Patient is without complaints other than noted in her HPI. All questions were answered.  All lab and sonogram results have been reviewed. Comments: abnormal:    Assessment:  1.  Pregnancy at 3562w0d,  Estimated Date                         2.  Class B DM                        3.  Chroic hypertension Meds:  No changes  Treatment Plan:  Begin twice weekly surveillance at 32 weeks, sono alt with NST  No Follow-up on file. for appointment for high risk OB care  No orders of the defined types were placed in this encounter.   Orders Placed This Encounter  Procedures  . POCT urinalysis dipstick

## 2016-02-01 ENCOUNTER — Ambulatory Visit (INDEPENDENT_AMBULATORY_CARE_PROVIDER_SITE_OTHER): Payer: Medicaid Other | Admitting: Obstetrics and Gynecology

## 2016-02-01 ENCOUNTER — Ambulatory Visit (INDEPENDENT_AMBULATORY_CARE_PROVIDER_SITE_OTHER): Payer: Medicaid Other

## 2016-02-01 ENCOUNTER — Encounter: Payer: Self-pay | Admitting: Obstetrics and Gynecology

## 2016-02-01 VITALS — BP 128/80 | HR 106 | Wt 299.5 lb

## 2016-02-01 DIAGNOSIS — Z3A33 33 weeks gestation of pregnancy: Secondary | ICD-10-CM

## 2016-02-01 DIAGNOSIS — O09893 Supervision of other high risk pregnancies, third trimester: Secondary | ICD-10-CM

## 2016-02-01 DIAGNOSIS — O3663X1 Maternal care for excessive fetal growth, third trimester, fetus 1: Secondary | ICD-10-CM

## 2016-02-01 DIAGNOSIS — O10913 Unspecified pre-existing hypertension complicating pregnancy, third trimester: Secondary | ICD-10-CM | POA: Diagnosis not present

## 2016-02-01 DIAGNOSIS — IMO0001 Reserved for inherently not codable concepts without codable children: Secondary | ICD-10-CM

## 2016-02-01 DIAGNOSIS — Z1389 Encounter for screening for other disorder: Secondary | ICD-10-CM | POA: Diagnosis not present

## 2016-02-01 DIAGNOSIS — Z331 Pregnant state, incidental: Secondary | ICD-10-CM | POA: Diagnosis not present

## 2016-02-01 DIAGNOSIS — R81 Glycosuria: Secondary | ICD-10-CM | POA: Diagnosis not present

## 2016-02-01 DIAGNOSIS — O24419 Gestational diabetes mellitus in pregnancy, unspecified control: Secondary | ICD-10-CM

## 2016-02-01 LAB — POCT URINALYSIS DIPSTICK
Blood, UA: NEGATIVE
Glucose, UA: 3
Leukocytes, UA: NEGATIVE
Nitrite, UA: NEGATIVE
Protein, UA: 1

## 2016-02-01 LAB — GLUCOSE, POCT (MANUAL RESULT ENTRY): POC Glucose: 247 mg/dl — AB (ref 70–99)

## 2016-02-01 NOTE — Progress Notes (Signed)
US 32+4 wks,efw 3923 g >90%,(AC,HC BPD >97%)fhr 160 bpm,afi 22 cm,bilat adnexa's wnl,post pl gr 2,BPP 8/8,RI .61,.58

## 2016-02-01 NOTE — Progress Notes (Signed)
Patient ID: Holly Pope, female   DOB: [birth year], 25 y.o.   MRN: [id number]   High Risk Pregnancy HROB Diagnosis(es):   Class B DM, chronic hypertension  G2P1001 7056w4d Estimated Date of Delivery: 03/24/16    HPI: The patient is being seen today for ongoing management of  Class B DM, chronic hypertension.  Today she reports she is feeling well. She denies emesis. Per pts CBG records, her CBGs have been stable at home with 126 fasting followed by 96 throughout the day and 86 fasting followed by 92 throughout the day. Pt states she routinely checks her CBG q4d. She reports her last baby weighed 7 lbs and delivered past her due date. Pt states she is not very physically active.   Patient reports good fetal movement, denies any bleeding and no rupture of membranes symptoms or regular contractions.   BP weight and urine results reviewed and noted. Blood pressure 128/80, pulse 106, weight 299 lb 8 oz (135.852 kg).  Fundal Height: 41 cm; U+31  Fetal Heart rate:  160 bpm  Physical Examination:  Edema:  none  Urinalysis: POSITIVE for +3 glucose, small ketones, +1 protein   Fetal Surveillance Testing today:  Fetal US, BPP   Lab and sonogram results have been reviewed. Comments: normal BPP; 3923 g >90 fetus   Assessment:  1.  Pregnancy at 2956w4d,  G2P1001                           2.   Class B DM, chronic hypertension                        3. CBG 247 in office today    4. Doubt accuracy of recorded CBGs                         Refer to diabetic educator.                         Short interval f/u Medication(s) Plans:  none  Treatment Plan:   Order BMET, A1C and consider Lantis  Repeat US in 4 weeks  Consult with diabetic educator   Follow up as scheduled in 3 days for NST and for appointment for high risk OB care, pt to bring her glucometer to this appointment     By signing my name below, I, Doreatha MartinEva Mathews, attest that this documentation has been prepared under the direction and in  the presence of Tilda BurrowJohn V Clinton Wahlberg, MD. Electronically Signed: Doreatha MartinEva Mathews, ED Scribe. 02/01/2016. 11:18 AM.  I personally performed the services described in this documentation, which was SCRIBED in my presence. The recorded information has been reviewed and considered accurate. It has been edited as necessary during review. Tilda BurrowFERGUSON,Ousman Dise V, MD

## 2016-02-01 NOTE — Progress Notes (Signed)
Pt states that her fasting BS was 90 this morning, pt has not had anything to eat yet. Pt 's urine showed 3+ glucose. CBG in office is 247.

## 2016-02-02 LAB — COMPREHENSIVE METABOLIC PANEL
ALT: 19 IU/L (ref 0–32)
AST: 17 IU/L (ref 0–40)
Albumin/Globulin Ratio: 1.2 (ref 1.2–2.2)
Albumin: 3.5 g/dL (ref 3.5–5.5)
Alkaline Phosphatase: 149 IU/L — ABNORMAL HIGH (ref 39–117)
BUN/Creatinine Ratio: 11 (ref 9–23)
BUN: 6 mg/dL (ref 6–20)
Bilirubin Total: 0.5 mg/dL (ref 0.0–1.2)
CO2: 14 mmol/L — ABNORMAL LOW (ref 18–29)
Calcium: 9.9 mg/dL (ref 8.7–10.2)
Chloride: 101 mmol/L (ref 96–106)
Creatinine, Ser: 0.54 mg/dL — ABNORMAL LOW (ref 0.57–1.00)
GFR calc Af Amer: 154 mL/min/{1.73_m2} (ref >59)
GFR calc non Af Amer: 133 mL/min/{1.73_m2} (ref >59)
Globulin, Total: 3 g/dL (ref 1.5–4.5)
Glucose: 222 mg/dL — ABNORMAL HIGH (ref 65–99)
Potassium: 4.2 mmol/L (ref 3.5–5.2)
Sodium: 134 mmol/L (ref 134–144)
Total Protein: 6.5 g/dL (ref 6.0–8.5)

## 2016-02-02 LAB — HEMOGLOBIN A1C
Est. average glucose Bld gHb Est-mCnc: 151 mg/dL
Hgb A1c MFr Bld: 6.9 % — ABNORMAL HIGH (ref 4.8–5.6)

## 2016-02-04 ENCOUNTER — Ambulatory Visit (INDEPENDENT_AMBULATORY_CARE_PROVIDER_SITE_OTHER): Payer: Medicaid Other | Admitting: Obstetrics & Gynecology

## 2016-02-04 ENCOUNTER — Encounter: Payer: Self-pay | Admitting: Obstetrics & Gynecology

## 2016-02-04 VITALS — BP 100/60 | HR 76 | Wt 303.0 lb

## 2016-02-04 DIAGNOSIS — O24913 Unspecified diabetes mellitus in pregnancy, third trimester: Secondary | ICD-10-CM | POA: Diagnosis not present

## 2016-02-04 DIAGNOSIS — Z3A33 33 weeks gestation of pregnancy: Secondary | ICD-10-CM | POA: Diagnosis not present

## 2016-02-04 DIAGNOSIS — O09893 Supervision of other high risk pregnancies, third trimester: Secondary | ICD-10-CM | POA: Diagnosis not present

## 2016-02-04 DIAGNOSIS — Z331 Pregnant state, incidental: Secondary | ICD-10-CM

## 2016-02-04 DIAGNOSIS — Z1389 Encounter for screening for other disorder: Secondary | ICD-10-CM | POA: Diagnosis not present

## 2016-02-04 DIAGNOSIS — O10913 Unspecified pre-existing hypertension complicating pregnancy, third trimester: Secondary | ICD-10-CM

## 2016-02-04 LAB — POCT URINALYSIS DIPSTICK
Blood, UA: NEGATIVE
Glucose, UA: NEGATIVE
Leukocytes, UA: NEGATIVE
Nitrite, UA: NEGATIVE

## 2016-02-04 NOTE — Progress Notes (Signed)
Fetal Surveillance Testing today:  Reactive NST   High Risk Pregnancy Diagnosis(es):   Class B DM, chronic hypertension  G2P1001 4140w0d Estimated Date of Delivery: 03/24/16  Blood pressure 100/60, pulse 76, weight 303 lb (137.44 kg).  Urinalysis: Negative   HPI: The patient is being seen today for ongoing management of as above. Today she reports CBG drastically higher all of a sudden Sunday am, suspect her glucometer(ReliOn) is not orking properly, will order another one, will not make any changes today and look at another week of CBG   BP weight and urine results all reviewed and noted. Patient reports good fetal movement, denies any bleeding and no rupture of membranes symptoms or regular contractions.  Fundal Height:  36 Fetal Heart rate:  135 Edema:  1+  Patient is without complaints other than noted in her HPI. All questions were answered.  All lab and sonogram results have been reviewed. Comments: abnormal:    Assessment:  1.  Pregnancy at 3740w0d,  Estimated Date of Delivery: 03/24/16 :                          2.  Class B DM                        3.  Chronic hypertension  Medication(s) Plans:  No changes metformin 1000 BID, glyburide 2.5 mg qhs, labetalol 100 BID  Treatment Plan:  NST 1 week NST and check new machine CBG  Return in about 7 days (around 02/11/2016) for NST, HROB, with Dr Despina HiddenEure. for appointment for high risk OB care  No orders of the defined types were placed in this encounter.   Orders Placed This Encounter  Procedures  . POCT urinalysis dipstick

## 2016-02-12 ENCOUNTER — Ambulatory Visit (INDEPENDENT_AMBULATORY_CARE_PROVIDER_SITE_OTHER): Payer: Medicaid Other | Admitting: Obstetrics & Gynecology

## 2016-02-12 VITALS — BP 120/60 | HR 106 | Wt 308.0 lb

## 2016-02-12 DIAGNOSIS — O24913 Unspecified diabetes mellitus in pregnancy, third trimester: Secondary | ICD-10-CM | POA: Diagnosis not present

## 2016-02-12 DIAGNOSIS — Z1389 Encounter for screening for other disorder: Secondary | ICD-10-CM

## 2016-02-12 DIAGNOSIS — Z3A34 34 weeks gestation of pregnancy: Secondary | ICD-10-CM | POA: Diagnosis not present

## 2016-02-12 DIAGNOSIS — Z331 Pregnant state, incidental: Secondary | ICD-10-CM

## 2016-02-12 DIAGNOSIS — O09893 Supervision of other high risk pregnancies, third trimester: Secondary | ICD-10-CM

## 2016-02-12 DIAGNOSIS — O10913 Unspecified pre-existing hypertension complicating pregnancy, third trimester: Secondary | ICD-10-CM | POA: Diagnosis not present

## 2016-02-12 LAB — POCT URINALYSIS DIPSTICK
Blood, UA: NEGATIVE
Glucose, UA: NEGATIVE
Ketones, UA: NEGATIVE
Leukocytes, UA: NEGATIVE
Nitrite, UA: NEGATIVE

## 2016-02-12 MED ORDER — GLYBURIDE 2.5 MG PO TABS: ORAL_TABLET | ORAL | Status: DC

## 2016-02-12 NOTE — Addendum Note (Signed)
Addended by: Lazaro ArmsEURE, Kazzandra Desaulniers H on: 02/12/2016 01:11 PM   Modules accepted: Orders

## 2016-02-12 NOTE — Progress Notes (Signed)
Fetal Surveillance Testing today:  FHR 138   High Risk Pregnancy Diagnosis(es):   Lass B DM, chronic hypertension  G2P1001 5757w1d Estimated Date of Delivery: 03/24/16  Blood pressure 120/60, pulse 106, weight 308 lb (139.708 kg).  Urinalysis: Positive for 1+ protein   HPI: The patient is being seen today for ongoing management of as above. Today she reports pt got new machine as documented below and CBG are much better but still a bit eevated, will increase her glyburide   BP weight and urine results all reviewed and noted. Patient reports good fetal movement, denies any bleeding and no rupture of membranes symptoms or regular contractions.  Fundal Height:  U+24 cm Fetal Heart rate:  138 Edema:  trace  Patient is without complaints other than noted in her HPI. All questions were answered.  All lab and sonogram results have been reviewed. Comments: abnormal:    Assessment:  1.  Pregnancy at 5457w1d,  Estimated Date of Delivery: 03/24/16 :                          2.  Class B DM                        3.  Chronic hypertension  Medication(s) Plans:  Metformin 1000 BID, increase glyburide to 5 mg at night and 2.5 mg in am, labetalol 100 BID  Treatment Plan:  Twice weekly surveillance and delivery at 39 weeks or as clinically indicated  No Follow-up on file. for appointment for high risk OB care  No orders of the defined types were placed in this encounter.   Orders Placed This Encounter  Procedures  . POCT urinalysis dipstick

## 2016-02-15 ENCOUNTER — Inpatient Hospital Stay (HOSPITAL_COMMUNITY)
Admission: AD | Admit: 2016-02-15 | Discharge: 2016-02-15 | Disposition: A | Discharge status: Home / Self Care | Payer: Medicaid Other | Source: Ambulatory Visit | Attending: Obstetrics & Gynecology | Admitting: Obstetrics & Gynecology

## 2016-02-15 ENCOUNTER — Encounter (HOSPITAL_COMMUNITY): Payer: Self-pay | Admitting: *Deleted

## 2016-02-15 DIAGNOSIS — Z6841 Body Mass Index (BMI) 40.0 and over, adult: Secondary | ICD-10-CM | POA: Diagnosis not present

## 2016-02-15 DIAGNOSIS — O26899 Other specified pregnancy related conditions, unspecified trimester: Secondary | ICD-10-CM

## 2016-02-15 DIAGNOSIS — O26893 Other specified pregnancy related conditions, third trimester: Secondary | ICD-10-CM | POA: Insufficient documentation

## 2016-02-15 DIAGNOSIS — O24913 Unspecified diabetes mellitus in pregnancy, third trimester: Secondary | ICD-10-CM | POA: Diagnosis not present

## 2016-02-15 DIAGNOSIS — R1030 Lower abdominal pain, unspecified: Secondary | ICD-10-CM | POA: Diagnosis not present

## 2016-02-15 DIAGNOSIS — Z7984 Long term (current) use of oral hypoglycemic drugs: Secondary | ICD-10-CM | POA: Insufficient documentation

## 2016-02-15 DIAGNOSIS — Z7982 Long term (current) use of aspirin: Secondary | ICD-10-CM | POA: Insufficient documentation

## 2016-02-15 DIAGNOSIS — Z833 Family history of diabetes mellitus: Secondary | ICD-10-CM | POA: Insufficient documentation

## 2016-02-15 DIAGNOSIS — Z8249 Family history of ischemic heart disease and other diseases of the circulatory system: Secondary | ICD-10-CM | POA: Insufficient documentation

## 2016-02-15 DIAGNOSIS — O163 Unspecified maternal hypertension, third trimester: Secondary | ICD-10-CM | POA: Diagnosis not present

## 2016-02-15 DIAGNOSIS — O9989 Other specified diseases and conditions complicating pregnancy, childbirth and the puerperium: Secondary | ICD-10-CM

## 2016-02-15 DIAGNOSIS — O99213 Obesity complicating pregnancy, third trimester: Secondary | ICD-10-CM | POA: Insufficient documentation

## 2016-02-15 DIAGNOSIS — Z825 Family history of asthma and other chronic lower respiratory diseases: Secondary | ICD-10-CM | POA: Insufficient documentation

## 2016-02-15 DIAGNOSIS — Z79899 Other long term (current) drug therapy: Secondary | ICD-10-CM | POA: Diagnosis not present

## 2016-02-15 DIAGNOSIS — Z3A34 34 weeks gestation of pregnancy: Secondary | ICD-10-CM | POA: Insufficient documentation

## 2016-02-15 DIAGNOSIS — Z3689 Encounter for other specified antenatal screening: Secondary | ICD-10-CM

## 2016-02-15 LAB — URINALYSIS, ROUTINE W REFLEX MICROSCOPIC
Bilirubin Urine: NEGATIVE
Glucose, UA: NEGATIVE mg/dL
Hgb urine dipstick: NEGATIVE
Ketones, ur: 15 mg/dL — AB
Leukocytes, UA: NEGATIVE
Nitrite: NEGATIVE
Protein, ur: 30 mg/dL — AB
Specific Gravity, Urine: 1.02 (ref 1.005–1.030)
pH: 6 (ref 5.0–8.0)

## 2016-02-15 LAB — URINE MICROSCOPIC-ADD ON: RBC / HPF: NONE SEEN RBC/hpf (ref 0–5)

## 2016-02-15 LAB — WET PREP, GENITAL
Clue Cells Wet Prep HPF POC: NONE SEEN
Sperm: NONE SEEN
Trich, Wet Prep: NONE SEEN
Yeast Wet Prep HPF POC: NONE SEEN

## 2016-02-15 LAB — POCT FERN TEST: POCT Fern Test: NEGATIVE

## 2016-02-15 NOTE — MAU Provider Note (Signed)
History     CSN: [id number]  Arrival date and time: 02/15/16 1812   None     No chief complaint on file.  HPI Comments: G2P1001 .4 c/o lower abd pain q5 min lasting about 5 sec since Saturday. Reports pelvic x3 wks. No VB. Occ. Back pain. No LOF. Good FM. No urinary sx. Denies constipation.    OB History    Gravida Para Term Preterm AB TAB SAB Ectopic Multiple Living   Past Medical History  Diagnosis Date  . H/O removal of neck cyst   . Morbidly obese (HCC)   . Hip dislocation, right (HCC) 05/06/2012  . Diabetes mellitus without complication (HCC)   . Hypertension   . Constipation 08/17/2015    Past Surgical History  Procedure Laterality Date  . Cystectomy  2002    rmoval from neck  . Tooth extraction N/A 11/11/2013    Procedure: EXTRACT 3RD MOLARS PLUS ONE;  Surgeon: Georgia Lopes, DDS;  Location: [facility] OR;  Service: Oral Surgery;  Laterality: N/A;    Family History  Problem Relation Age of Onset  . Heart disease Mother   . Hypertension Mother   . Diabetes Mother   . Heart disease Father   . Hypertension Father   . Hypertension Brother   . Diabetes Brother   . Asthma Daughter   . Other Maternal Grandfather     had a pacemaker  . Alzheimer's disease Maternal Grandfather     Social History  Substance Use Topics  . Smoking status: Never Smoker   . Smokeless tobacco: Never Used  . Alcohol Use: No    Allergies: No Known Allergies  Prescriptions prior to admission  Medication Sig Dispense Refill Last Dose  . aspirin EC 81 MG tablet Take 1 tablet (81 mg total) by mouth daily. 30 tablet 6 Taking  . glyBURIDE (DIABETA) 2.5 MG tablet Take 1 tablet in am and 2 at bedtime 90 tablet 3   . labetalol (NORMODYNE) 100 MG tablet Take 2 tablets (200 mg total) by mouth 2 (two) times daily. 60 tablet 4 Taking  . metFORMIN (GLUCOPHAGE) 1000 MG tablet Take 1,000 mg by mouth 2 (two) times daily with a meal.   Taking  . prenatal vitamin w/FE, FA (PRENATAL 1  + 1) 27-1 MG TABS tablet One tablet by mouth daily 30 each 11 Taking    Review of Systems  Constitutional: Negative.   HENT: Negative.   Respiratory: Negative.   Cardiovascular: Negative.   Gastrointestinal: Negative.   Musculoskeletal: Negative.   Neurological: Negative.    Physical Exam   Blood pressure 135/81, pulse 112, temperature 98.1 F (36.7 C), temperature source Oral, resp. rate 18, height  (1.575 m), weight 305 lb (138.347 kg).  Physical Exam  Constitutional: She is oriented to person, place, and time. She appears well-developed and well-nourished.  HENT:  Head: Normocephalic and atraumatic.  Neck: Normal range of motion. Neck supple.  Cardiovascular:  tachy  Respiratory: Effort normal.  GI: Soft.  gravid  Genitourinary:  Speculum: scant, thin discharge SVE: closed/thick  Musculoskeletal: Normal range of motion.  Neurological: She is alert and oriented to person, place, and time.  Skin: Skin is warm and dry.  Psychiatric: She has a normal mood and affect.  EFM: 145 bpm, mod variability, + accels, no decels Toco: irritability   MAU Course  Procedures Results for orders placed or performed during the  hospital encounter of 02/15/16 (from the past 24 hour(s))  Wet prep, genital     Status: Abnormal   Collection Time: 02/15/16  7:20 PM  Result Value Ref Range   Yeast Wet Prep HPF POC NONE SEEN NONE SEEN   Trich, Wet Prep NONE SEEN NONE SEEN   Clue Cells Wet Prep HPF POC NONE SEEN NONE SEEN   WBC, Wet Prep HPF POC FEW (A) NONE SEEN   Sperm NONE SEEN   Fern Test     Status: None   Collection Time: 02/15/16  8:24 PM  Result Value Ref Range   POCT Fern Test Negative = intact amniotic membranes   Urinalysis, Routine w reflex microscopic (not [facility])     Status: Abnormal   Collection Time: 02/15/16  8:31 PM  Result Value Ref Range   Color, Urine YELLOW YELLOW   APPearance CLEAR CLEAR   Specific Gravity, Urine 1.020 1.005 - 1.030   pH 6.0 5.0 - 8.0    Glucose, UA NEGATIVE NEGATIVE mg/dL   Hgb urine dipstick NEGATIVE NEGATIVE   Bilirubin Urine NEGATIVE NEGATIVE   Ketones, ur 15 (A) NEGATIVE mg/dL   Protein, ur 30 (A) NEGATIVE mg/dL   Nitrite NEGATIVE NEGATIVE   Leukocytes, UA NEGATIVE NEGATIVE  Urine microscopic-add on     Status: Abnormal   Collection Time: 02/15/16  8:31 PM  Result Value Ref Range   Squamous Epithelial / LPF 0-5 (A) NONE SEEN   WBC, UA 0-5 0 - 5 WBC/hpf   RBC / HPF NONE SEEN 0 - 5 RBC/hpf   Bacteria, UA FEW (A) NONE SEEN   Urine-Other MUCOUS PRESENT     MDM 1820-Pt states "I think my water broke", fluid on bed appears to look and smell like urine, fern neg. No evidence of PTL or SROM. Stable for discharge home.  Assessment and Plan   1. NST (non-stress test) reactive   2. Pregnancy related abdominal pain of lower quadrant, antepartum    Discharge home PTL precautions [facility]  Donette LarryMelanie Travarius Lange 02/15/2016, 7:06 PM

## 2016-02-15 NOTE — MAU Note (Signed)
Pt called out stating she think her water broke. RN at bedside. Pt noted to have urinated on pad. Fern slide collected. Fern negative.

## 2016-02-15 NOTE — MAU Note (Addendum)
Having pelvic pressure for a couple days.   Has been having little pains in the belly, that come and go. Has had problems with constipation

## 2016-02-15 NOTE — Discharge Instructions (Signed)
Braxton Hicks Contractions °Contractions of the uterus can occur throughout pregnancy. Contractions are not always a sign that you are in labor.  °WHAT ARE BRAXTON HICKS CONTRACTIONS?  °Contractions that occur before labor are called Braxton Hicks contractions, or false labor. Toward the end of pregnancy (32-34 weeks), these contractions can develop more often and may become more forceful. This is not true labor because these contractions do not result in opening (dilatation) and thinning of the cervix. They are sometimes difficult to tell apart from true labor because these contractions can be forceful and people have different pain tolerances. You should not feel embarrassed if you go to the hospital with false labor. Sometimes, the only way to tell if you are in true labor is for your health care provider to look for changes in the cervix. °If there are no prenatal problems or other health problems associated with the pregnancy, it is completely safe to be sent home with false labor and await the onset of true labor. °HOW CAN YOU TELL THE DIFFERENCE BETWEEN TRUE AND FALSE LABOR? °False Labor °· The contractions of false labor are usually shorter and not as hard as those of true labor.   °· The contractions are usually irregular.   °· The contractions are often felt in the front of the lower abdomen and in the groin.   °· The contractions may go away when you walk around or change positions while lying down.   °· The contractions get weaker and are shorter lasting as time goes on.   °· The contractions do not usually become progressively stronger, regular, and closer together as with true labor.   °True Labor °· Contractions in true labor last 30-70 seconds, become very regular, usually become more intense, and increase in frequency.   °· The contractions do not go away with walking.   °· The discomfort is usually felt in the top of the uterus and spreads to the lower abdomen and low back.   °· True labor can be  determined by your health care provider with an exam. This will show that the cervix is dilating and getting thinner.   °WHAT TO REMEMBER °· Keep up with your usual exercises and follow other instructions given by your health care provider.   °· Take medicines as directed by your health care provider.   °· Keep your regular prenatal appointments.   °· Eat and drink lightly if you think you are going into labor.   °· If Braxton Hicks contractions are making you uncomfortable:   °¨ Change your position from lying down or resting to walking, or from walking to resting.   °¨ Sit and rest in a tub of warm water.   °¨ Drink 2-3 glasses of water. Dehydration may cause these contractions.   °¨ Do slow and deep breathing several times an hour.   °WHEN SHOULD I SEEK IMMEDIATE MEDICAL CARE? °Seek immediate medical care if: °· Your contractions become stronger, more regular, and closer together.   °· You have fluid leaking or gushing from your vagina.   °· You have a fever.   °· You pass blood-tinged mucus.   °· You have vaginal bleeding.   °· You have continuous abdominal pain.   °· You have low back pain that you never had before.   °· You feel your baby's head pushing down and causing pelvic pressure.   °· Your baby is not moving as much as it used to.   °  °This information is not intended to replace advice given to you by your health care provider. Make sure you discuss any questions you have with your health care   provider. °  °Document Released: 07/25/2005 Document Revised: 07/30/2013 Document Reviewed: 05/06/2013 °Elsevier Interactive Patient Education ©2016 [employer] ° °

## 2016-02-16 ENCOUNTER — Encounter: Payer: Self-pay | Admitting: Obstetrics & Gynecology

## 2016-02-16 ENCOUNTER — Ambulatory Visit (INDEPENDENT_AMBULATORY_CARE_PROVIDER_SITE_OTHER): Payer: Medicaid Other | Admitting: Obstetrics & Gynecology

## 2016-02-16 VITALS — BP 110/70 | HR 80 | Wt 304.0 lb

## 2016-02-16 DIAGNOSIS — O09893 Supervision of other high risk pregnancies, third trimester: Secondary | ICD-10-CM

## 2016-02-16 DIAGNOSIS — Z3A35 35 weeks gestation of pregnancy: Secondary | ICD-10-CM

## 2016-02-16 DIAGNOSIS — O10913 Unspecified pre-existing hypertension complicating pregnancy, third trimester: Secondary | ICD-10-CM

## 2016-02-16 DIAGNOSIS — Z331 Pregnant state, incidental: Secondary | ICD-10-CM | POA: Diagnosis not present

## 2016-02-16 DIAGNOSIS — O24913 Unspecified diabetes mellitus in pregnancy, third trimester: Secondary | ICD-10-CM

## 2016-02-16 DIAGNOSIS — Z1389 Encounter for screening for other disorder: Secondary | ICD-10-CM

## 2016-02-16 LAB — GC/CHLAMYDIA PROBE AMP (~~LOC~~) NOT AT ARMC
Chlamydia: NEGATIVE
Neisseria Gonorrhea: NEGATIVE

## 2016-02-16 LAB — POCT URINALYSIS DIPSTICK
Blood, UA: NEGATIVE
Glucose, UA: NEGATIVE
Leukocytes, UA: NEGATIVE
Nitrite, UA: NEGATIVE
Protein, UA: 1

## 2016-02-16 MED ORDER — GLYBURIDE 5 MG PO TABS: ORAL_TABLET | ORAL | Status: DC

## 2016-02-16 NOTE — Progress Notes (Signed)
Fetal Surveillance Testing today:  Reactive NST   High Risk Pregnancy Diagnosis(es):   Class B DM, chronic hypertension  G2P1001 3771w5d Estimated Date of Delivery: 03/24/16  Blood pressure 110/70, pulse 80, weight 304 lb (137.893 kg).  Urinalysis: 1+ protein   HPI: The patient is being seen today for ongoing management of as above. Today she reports CBG are still high, having difficulty "catching" up to her CBG, have doubled her meds   BP weight and urine results all reviewed and noted. Patient reports good fetal movement, denies any bleeding and no rupture of membranes symptoms or regular contractions.  Fundal Height:  U+24 Fetal Heart rate:  135 Edema:  1+  Patient is without complaints other than noted in her HPI. All questions were answered.  All lab and sonogram results have been reviewed. Comments: abnormal:    Assessment:  1.  Pregnancy at 7571w5d,  Estimated Date of Delivery: 03/24/16 :                          2.  Class B DM                        3.  Chronic hypertension  Medication(s) Plans:  Increase glyburide to 10 hs, 5 am, continue metformin 100 BID, labetalol 100 BID  Treatment Plan:  Twice weekly assessments, sonogram at 36 weeks for EFW as well  No Follow-up on file. for appointment for high risk OB care  Meds ordered this encounter  Medications  . glyBURIDE (DIABETA) 5 MG tablet    Sig: Take 1 tablet in am and 2 at bedtime    Dispense:  90 tablet    Refill:  3   Orders Placed This Encounter  Procedures  . POCT urinalysis dipstick

## 2016-02-18 ENCOUNTER — Ambulatory Visit (INDEPENDENT_AMBULATORY_CARE_PROVIDER_SITE_OTHER): Payer: Medicaid Other | Admitting: Obstetrics & Gynecology

## 2016-02-18 ENCOUNTER — Ambulatory Visit (INDEPENDENT_AMBULATORY_CARE_PROVIDER_SITE_OTHER): Payer: Medicaid Other

## 2016-02-18 ENCOUNTER — Encounter: Payer: Self-pay | Admitting: Obstetrics & Gynecology

## 2016-02-18 VITALS — BP 90/60 | HR 76 | Wt 310.0 lb

## 2016-02-18 DIAGNOSIS — O24913 Unspecified diabetes mellitus in pregnancy, third trimester: Secondary | ICD-10-CM | POA: Diagnosis not present

## 2016-02-18 DIAGNOSIS — O09893 Supervision of other high risk pregnancies, third trimester: Secondary | ICD-10-CM

## 2016-02-18 DIAGNOSIS — Z3A35 35 weeks gestation of pregnancy: Secondary | ICD-10-CM

## 2016-02-18 DIAGNOSIS — O10913 Unspecified pre-existing hypertension complicating pregnancy, third trimester: Secondary | ICD-10-CM

## 2016-02-18 DIAGNOSIS — O99213 Obesity complicating pregnancy, third trimester: Secondary | ICD-10-CM

## 2016-02-18 DIAGNOSIS — Z1389 Encounter for screening for other disorder: Secondary | ICD-10-CM | POA: Diagnosis not present

## 2016-02-18 DIAGNOSIS — O403XX1 Polyhydramnios, third trimester, fetus 1: Secondary | ICD-10-CM | POA: Diagnosis not present

## 2016-02-18 DIAGNOSIS — Z331 Pregnant state, incidental: Secondary | ICD-10-CM | POA: Diagnosis not present

## 2016-02-18 LAB — POCT URINALYSIS DIPSTICK
Blood, UA: NEGATIVE
Glucose, UA: NEGATIVE
Leukocytes, UA: NEGATIVE
Nitrite, UA: NEGATIVE

## 2016-02-18 MED ORDER — INSULIN GLARGINE 100 UNIT/ML SOLOSTAR PEN: 20.0000 [IU] | PEN_INJECTOR | Freq: Every day | SUBCUTANEOUS | Status: DC

## 2016-02-18 NOTE — Progress Notes (Signed)
US 35 wks,trans.head rt, skin edema(abd,scalp,arms) ,fhr 146 bpm,post pl gr 3,RI .64,.61,polyhydramnios 24.9cm,BPP 8/8

## 2016-02-18 NOTE — Progress Notes (Signed)
Fetal Surveillance Testing today:  Sonogram BPP 8/8 with normal Dopplers, LGA >95%   High Risk Pregnancy Diagnosis(es):   Class B DM, chronic hypertension, polyhydramnios  G2P1001 4921w0d Estimated Date of Delivery: 03/24/16  Blood pressure 90/60, pulse 76, weight (!) 310 lb (140.6 kg), not currently breastfeeding.  Urinalysis: Negative   HPI: The patient is being seen today for ongoing management of as above. Today she reports BS and BP are good   BP weight and urine results all reviewed and noted. Patient reports good fetal movement, denies any bleeding and no rupture of membranes symptoms or regular contractions.  Fundal Height:  U+28 Fetal Heart rate:  144 Edema:  1+  Patient is without complaints other than noted in her HPI. All questions were answered.  All lab and sonogram results have been reviewed. Comments:    Assessment:  1.  Pregnancy at 5721w0d,  Estimated Date of Delivery: 03/24/16 :                          2.  Class B DM                        3.  CHTN  Medication(s) Plans:  Continue current regimen  Treatment Plan:  Twice weekly surveillance, sonogram alternating with NST, delivery at 39 weeks or as clinically indicated   Return in about 4 days (around 02/22/2016) for NST, HROB, with Dr Despina HiddenEure. for appointment for high risk OB care  Meds ordered this encounter  Medications  . DISCONTD: Insulin Glargine (LANTUS) 100 UNIT/ML Solostar Pen    Sig: Inject 20 Units into the skin daily at 10 pm.    Dispense:  15 mL    Refill:  11   Orders Placed This Encounter  Procedures  . POCT urinalysis dipstick

## 2016-02-22 ENCOUNTER — Encounter: Payer: Self-pay | Admitting: Obstetrics & Gynecology

## 2016-02-22 ENCOUNTER — Ambulatory Visit (INDEPENDENT_AMBULATORY_CARE_PROVIDER_SITE_OTHER): Payer: Medicaid Other | Admitting: Obstetrics & Gynecology

## 2016-02-22 VITALS — BP 110/70 | HR 74 | Wt 309.0 lb

## 2016-02-22 DIAGNOSIS — O10913 Unspecified pre-existing hypertension complicating pregnancy, third trimester: Secondary | ICD-10-CM

## 2016-02-22 DIAGNOSIS — Z3A36 36 weeks gestation of pregnancy: Secondary | ICD-10-CM | POA: Diagnosis not present

## 2016-02-22 DIAGNOSIS — Z331 Pregnant state, incidental: Secondary | ICD-10-CM

## 2016-02-22 DIAGNOSIS — Z1389 Encounter for screening for other disorder: Secondary | ICD-10-CM | POA: Diagnosis not present

## 2016-02-22 DIAGNOSIS — O09893 Supervision of other high risk pregnancies, third trimester: Secondary | ICD-10-CM | POA: Diagnosis not present

## 2016-02-22 DIAGNOSIS — O24913 Unspecified diabetes mellitus in pregnancy, third trimester: Secondary | ICD-10-CM | POA: Diagnosis not present

## 2016-02-22 LAB — POCT URINALYSIS DIPSTICK
Blood, UA: NEGATIVE
Glucose, UA: NEGATIVE
Leukocytes, UA: NEGATIVE
Nitrite, UA: NEGATIVE

## 2016-02-22 NOTE — Progress Notes (Signed)
Fetal Surveillance Testing today:  Reactive NST(difficult to trace, baby was moving everywhere)   High Risk Pregnancy Diagnosis(es):   Class B DM, chronic hypertension  G2P1001 2334w4d Estimated Date of Delivery: 03/24/16  Blood pressure 110/70, pulse 74, weight 309 lb (140.161 kg).  Urinalysis: Negative   HPI: The patient is being seen today for ongoing management of as above. Today she reports CBG are better since we added Lantus 20 hs   BP weight and urine results all reviewed and noted. Patient reports good fetal movement, denies any bleeding and no rupture of membranes symptoms or regular contractions.  Fundal Height:  U+26 Fetal Heart rate:  135 Edema:  none  Patient is without complaints other than noted in her HPI. All questions were answered.  All lab and sonogram results have been reviewed. Comments: abnormal:    Assessment:  1.  Pregnancy at 7234w4d,  Estimated Date of Delivery: 03/24/16 :                          2.  Class B DM, was under great control but have had to dial up meds recently due to inadequate control, weight is stable                        3.  Chronic hypertension, well controlled on labetalol 100 BID  Medication(s) Plans:  Continue labetalol 100 mg twice a day and baby aspirin, continue glyburide 10 in the evening 5 in the morning metformin 1000 twice a day and Lantus 20 had bedtime  Treatment Plan:  Continue twice weekly assessments sonogram alternating with NST planned induction at 39 weeks unless clinically otherwise indicated  No Follow-up on file. for appointment for high risk OB care  No orders of the defined types were placed in this encounter.   Orders Placed This Encounter  Procedures  . POCT urinalysis dipstick

## 2016-02-24 ENCOUNTER — Other Ambulatory Visit: Payer: Self-pay | Admitting: Obstetrics & Gynecology

## 2016-02-24 DIAGNOSIS — O163 Unspecified maternal hypertension, third trimester: Secondary | ICD-10-CM

## 2016-02-24 DIAGNOSIS — O24913 Unspecified diabetes mellitus in pregnancy, third trimester: Secondary | ICD-10-CM

## 2016-02-25 ENCOUNTER — Ambulatory Visit (INDEPENDENT_AMBULATORY_CARE_PROVIDER_SITE_OTHER): Payer: Medicaid Other | Admitting: Obstetrics & Gynecology

## 2016-02-25 ENCOUNTER — Ambulatory Visit (INDEPENDENT_AMBULATORY_CARE_PROVIDER_SITE_OTHER): Payer: Medicaid Other

## 2016-02-25 VITALS — BP 110/70 | HR 80 | Wt 311.0 lb

## 2016-02-25 DIAGNOSIS — Z3A36 36 weeks gestation of pregnancy: Secondary | ICD-10-CM | POA: Diagnosis not present

## 2016-02-25 DIAGNOSIS — O10913 Unspecified pre-existing hypertension complicating pregnancy, third trimester: Secondary | ICD-10-CM

## 2016-02-25 DIAGNOSIS — O24913 Unspecified diabetes mellitus in pregnancy, third trimester: Secondary | ICD-10-CM | POA: Diagnosis not present

## 2016-02-25 DIAGNOSIS — O99213 Obesity complicating pregnancy, third trimester: Secondary | ICD-10-CM | POA: Diagnosis not present

## 2016-02-25 DIAGNOSIS — O09893 Supervision of other high risk pregnancies, third trimester: Secondary | ICD-10-CM

## 2016-02-25 DIAGNOSIS — O403XX1 Polyhydramnios, third trimester, fetus 1: Secondary | ICD-10-CM

## 2016-02-25 DIAGNOSIS — O3663X1 Maternal care for excessive fetal growth, third trimester, fetus 1: Secondary | ICD-10-CM

## 2016-02-25 DIAGNOSIS — Z1389 Encounter for screening for other disorder: Secondary | ICD-10-CM | POA: Diagnosis not present

## 2016-02-25 DIAGNOSIS — O163 Unspecified maternal hypertension, third trimester: Secondary | ICD-10-CM

## 2016-02-25 DIAGNOSIS — Z331 Pregnant state, incidental: Secondary | ICD-10-CM | POA: Diagnosis not present

## 2016-02-25 DIAGNOSIS — O401XX1 Polyhydramnios, first trimester, fetus 1: Secondary | ICD-10-CM

## 2016-02-25 DIAGNOSIS — O24313 Unspecified pre-existing diabetes mellitus in pregnancy, third trimester: Secondary | ICD-10-CM

## 2016-02-25 DIAGNOSIS — IMO0001 Reserved for inherently not codable concepts without codable children: Secondary | ICD-10-CM

## 2016-02-25 LAB — POCT URINALYSIS DIPSTICK
Blood, UA: NEGATIVE
Glucose, UA: NEGATIVE
Ketones, UA: NEGATIVE
Leukocytes, UA: NEGATIVE
Nitrite, UA: NEGATIVE
Protein, UA: 1

## 2016-02-25 NOTE — Progress Notes (Signed)
Fetal Surveillance Testing today:  BPP 8/8 with normal Dopplers   High Risk Pregnancy Diagnosis(es):   Class B DM, CHTN  G2P1001 2262w0d Estimated Date of Delivery: 03/24/16  Blood pressure 110/70, pulse 80, weight (!) 311 lb (141.1 kg), not currently breastfeeding.  Urinalysis: Negative   HPI: The patient is being seen today for ongoing management of as above. Today she reports CBG and BP at home are good   BP weight and urine results all reviewed and noted. Patient reports good fetal movement, denies any bleeding and no rupture of membranes symptoms or regular contractions.  Fundal Height:  U_24 Fetal Heart rate:  145 Edema:  1+  Patient is without complaints other than noted in her HPI. All questions were answered.  All lab and sonogram results have been reviewed. Comments: abnormal:    Assessment:  1.  Pregnancy at 3062w0d,  Estimated Date of Delivery: 03/24/16 :                          2.  Class B DM                        3.  CHTN  Medication(s) Plans:  Continue glyburide 5 BID metformin 1000 BID lantus 20 qhs labetalol 100 BID  Treatment Plan:  Twice weekly surveillance, sonogram alternating with NST, induction at 39 weeks or as clinically indicated   Return in about 4 days (around 02/29/2016) for NST, HROB, with Dr Despina HiddenEure. for appointment for high risk OB care  No orders of the defined types were placed in this encounter.  Orders Placed This Encounter  Procedures  . POCT urinalysis dipstick

## 2016-02-25 NOTE — Progress Notes (Signed)
US 36 wks,cephalic,fhr 128 bpm,bilat adnexa's wnl,post pl gr 3,afi 35 cm (polyhydramnios),RI .54,.52,EFW 4984 g >90%,BPP 8/8

## 2016-02-29 ENCOUNTER — Ambulatory Visit (INDEPENDENT_AMBULATORY_CARE_PROVIDER_SITE_OTHER): Payer: Medicaid Other | Admitting: Obstetrics & Gynecology

## 2016-02-29 VITALS — BP 110/80 | HR 80 | Wt 308.0 lb

## 2016-02-29 DIAGNOSIS — Z3A37 37 weeks gestation of pregnancy: Secondary | ICD-10-CM

## 2016-02-29 DIAGNOSIS — Z1389 Encounter for screening for other disorder: Secondary | ICD-10-CM

## 2016-02-29 DIAGNOSIS — O24913 Unspecified diabetes mellitus in pregnancy, third trimester: Secondary | ICD-10-CM | POA: Diagnosis not present

## 2016-02-29 DIAGNOSIS — Z1159 Encounter for screening for other viral diseases: Secondary | ICD-10-CM

## 2016-02-29 DIAGNOSIS — O09893 Supervision of other high risk pregnancies, third trimester: Secondary | ICD-10-CM

## 2016-02-29 DIAGNOSIS — O3663X1 Maternal care for excessive fetal growth, third trimester, fetus 1: Secondary | ICD-10-CM

## 2016-02-29 DIAGNOSIS — Z331 Pregnant state, incidental: Secondary | ICD-10-CM | POA: Diagnosis not present

## 2016-02-29 DIAGNOSIS — Z3685 Encounter for antenatal screening for Streptococcus B: Secondary | ICD-10-CM

## 2016-02-29 DIAGNOSIS — Z118 Encounter for screening for other infectious and parasitic diseases: Secondary | ICD-10-CM

## 2016-02-29 DIAGNOSIS — O403XX1 Polyhydramnios, third trimester, fetus 1: Secondary | ICD-10-CM | POA: Diagnosis not present

## 2016-02-29 DIAGNOSIS — O10913 Unspecified pre-existing hypertension complicating pregnancy, third trimester: Secondary | ICD-10-CM

## 2016-02-29 DIAGNOSIS — O1203 Gestational edema, third trimester: Secondary | ICD-10-CM

## 2016-02-29 DIAGNOSIS — O24313 Unspecified pre-existing diabetes mellitus in pregnancy, third trimester: Secondary | ICD-10-CM | POA: Diagnosis not present

## 2016-02-29 LAB — POCT URINALYSIS DIPSTICK
Blood, UA: NEGATIVE
Glucose, UA: NEGATIVE
Ketones, UA: NEGATIVE
Leukocytes, UA: NEGATIVE
Nitrite, UA: NEGATIVE
Protein, UA: NEGATIVE

## 2016-02-29 LAB — OB RESULTS CONSOLE GBS: GBS: POSITIVE

## 2016-02-29 NOTE — Progress Notes (Signed)
Fetal Surveillance Testing today:  Reactive NST   High Risk Pregnancy Diagnosis(es):   Class B DM, chronic hypertension, polyhydramnios and fetal macrosomia  G2P1001 [redacted]w[redacted]d Estimated Date of Delivery: 03/24/16  Blood pressure 110/80, pulse 80, weight (!) 308 lb (139.7 kg), not currently breastfeeding.  Urinalysis: Negative   HPI: The patient is being seen today for ongoing management of as above. Today she reports CBG are good   BP weight and urine results all reviewed and noted. Patient reports good fetal movement, denies any bleeding and no rupture of membranes symptoms or regular contractions.  Fundal Height:  U+28 Fetal Heart rate:  130 Edema:  1+  Patient is without complaints other than noted in her HPI. All questions were answered.  All lab and sonogram results have been reviewed. Comments: abnormal: EFW   Assessment:  1.  Pregnancy at [redacted]w[redacted]d,  Estimated Date of Delivery: 03/24/16 :                          2.  Class B DM                        3.   Chronic HTN  Medication(s) Plans:  Continue current regimen  Treatment Plan:  Twice weekly surveillance, sonogram alternating with NST, deliver at 39 weeks or as clinically indicated   Return in about 3 days (around 03/03/2016) for NST, HROB, with Dr Despina Hidden. for appointment for high risk OB care  No orders of the defined types were placed in this encounter.  Orders Placed This Encounter  Procedures  . Culture, beta strep (group b only)  . GC/Chlamydia Probe Amp  . POCT urinalysis dipstick

## 2016-03-02 LAB — GC/CHLAMYDIA PROBE AMP
Chlamydia trachomatis, NAA: NEGATIVE
Neisseria gonorrhoeae by PCR: NEGATIVE

## 2016-03-03 ENCOUNTER — Encounter: Payer: Self-pay | Admitting: Obstetrics & Gynecology

## 2016-03-03 ENCOUNTER — Ambulatory Visit (INDEPENDENT_AMBULATORY_CARE_PROVIDER_SITE_OTHER): Payer: Medicaid Other | Admitting: Obstetrics & Gynecology

## 2016-03-03 VITALS — BP 112/80 | HR 100 | Wt 302.0 lb

## 2016-03-03 DIAGNOSIS — O24913 Unspecified diabetes mellitus in pregnancy, third trimester: Secondary | ICD-10-CM | POA: Diagnosis not present

## 2016-03-03 DIAGNOSIS — O403XX1 Polyhydramnios, third trimester, fetus 1: Secondary | ICD-10-CM | POA: Diagnosis not present

## 2016-03-03 DIAGNOSIS — Z3A37 37 weeks gestation of pregnancy: Secondary | ICD-10-CM | POA: Diagnosis not present

## 2016-03-03 DIAGNOSIS — O10913 Unspecified pre-existing hypertension complicating pregnancy, third trimester: Secondary | ICD-10-CM | POA: Diagnosis not present

## 2016-03-03 DIAGNOSIS — O09893 Supervision of other high risk pregnancies, third trimester: Secondary | ICD-10-CM | POA: Diagnosis not present

## 2016-03-03 DIAGNOSIS — O99213 Obesity complicating pregnancy, third trimester: Secondary | ICD-10-CM | POA: Diagnosis not present

## 2016-03-03 DIAGNOSIS — O3663X1 Maternal care for excessive fetal growth, third trimester, fetus 1: Secondary | ICD-10-CM | POA: Diagnosis not present

## 2016-03-03 DIAGNOSIS — Z1389 Encounter for screening for other disorder: Secondary | ICD-10-CM

## 2016-03-03 DIAGNOSIS — Z331 Pregnant state, incidental: Secondary | ICD-10-CM

## 2016-03-03 LAB — POCT URINALYSIS DIPSTICK
Blood, UA: NEGATIVE
Glucose, UA: NEGATIVE
Ketones, UA: NEGATIVE
Leukocytes, UA: NEGATIVE
Nitrite, UA: NEGATIVE
Protein, UA: 1

## 2016-03-03 NOTE — Progress Notes (Signed)
Fetal Surveillance Testing today:  Reactive NST   High Risk Pregnancy Diagnosis(es):   Class B DM, chronic hypertension, polyhydramnios, suspected fetal macrosomia  G2P1001 [redacted]w[redacted]d Estimated Date of Delivery: 03/24/16  Blood pressure 112/80, pulse 100, weight (!) 302 lb (137 kg).  Urinalysis: Positive for 1+ protein   HPI: The patient is being seen today for ongoing management of as aboe. Today she reports CBG are better with increase lantus to 25 units   BP weight and urine results all reviewed and noted. Patient reports good fetal movement, denies any bleeding and no rupture of membranes symptoms or regular contractions.  Fundal Height: U+32cm  Fetal Heart rate:  135 Edema:  1+  Patient is without complaints other than noted in her HPI. All questions were answered.  All lab and sonogram results have been reviewed. Comments: abnormal:    Assessment:  1.  Pregnancy at [redacted]w[redacted]d,  Estimated Date of Delivery: 03/24/16 :                          2.  Class B DM with moderate polyhydramnios and suspected fetal macrosomia, EFW 5000 grams                        3.  Chronic Hypertension  Medication(s) Plans:  Glyburide 10 BID, metformin 1000 BID, Lantus 25, labetalol 100 BID, baby ASA  Treatment Plan:  Twice weekly surveillance sonogram alt NST, Caesarean section scheduled for 03/15/2016@0730   Return in about 4 days (around 03/07/2016) for BPP/sono, HROB, with Dr Despina Hidden. for appointment for high risk OB care  No orders of the defined types were placed in this encounter.  Orders Placed This Encounter  Procedures  . [place] UA Cord Doppler  . US Fetal BPP W/O Non Stress  . POCT urinalysis dipstick

## 2016-03-04 LAB — CULTURE, BETA STREP (GROUP B ONLY): Strep Gp B Culture: POSITIVE — AB

## 2016-03-07 ENCOUNTER — Telehealth (HOSPITAL_COMMUNITY): Payer: Self-pay | Admitting: *Deleted

## 2016-03-07 ENCOUNTER — Ambulatory Visit (INDEPENDENT_AMBULATORY_CARE_PROVIDER_SITE_OTHER): Payer: Medicaid Other | Admitting: Obstetrics & Gynecology

## 2016-03-07 ENCOUNTER — Ambulatory Visit (INDEPENDENT_AMBULATORY_CARE_PROVIDER_SITE_OTHER): Payer: Medicaid Other

## 2016-03-07 VITALS — BP 100/58 | HR 92 | Wt 302.0 lb

## 2016-03-07 DIAGNOSIS — O3663X1 Maternal care for excessive fetal growth, third trimester, fetus 1: Secondary | ICD-10-CM | POA: Diagnosis not present

## 2016-03-07 DIAGNOSIS — O403XX1 Polyhydramnios, third trimester, fetus 1: Secondary | ICD-10-CM

## 2016-03-07 DIAGNOSIS — Z3A38 38 weeks gestation of pregnancy: Secondary | ICD-10-CM | POA: Diagnosis not present

## 2016-03-07 DIAGNOSIS — O10913 Unspecified pre-existing hypertension complicating pregnancy, third trimester: Secondary | ICD-10-CM

## 2016-03-07 DIAGNOSIS — Z331 Pregnant state, incidental: Secondary | ICD-10-CM

## 2016-03-07 DIAGNOSIS — O09893 Supervision of other high risk pregnancies, third trimester: Secondary | ICD-10-CM

## 2016-03-07 DIAGNOSIS — O24913 Unspecified diabetes mellitus in pregnancy, third trimester: Secondary | ICD-10-CM

## 2016-03-07 DIAGNOSIS — Z1389 Encounter for screening for other disorder: Secondary | ICD-10-CM

## 2016-03-07 LAB — POCT URINALYSIS DIPSTICK
Blood, UA: NEGATIVE
Glucose, UA: NEGATIVE
Ketones, UA: NEGATIVE
Leukocytes, UA: NEGATIVE
Nitrite, UA: NEGATIVE

## 2016-03-07 NOTE — Progress Notes (Signed)
Fetal Surveillance Testing today:  BPP 8/8, Dopplers normal   High Risk Pregnancy Diagnosis(es):   Class B DM, chronic hypertension, suspected fetal macrosomia, morbid obesity, polyhydramnios  G2P1001 [redacted]w[redacted]d Estimated Date of Delivery: 03/24/16  Blood pressure (!) 100/58, pulse 92, weight (!) 302 lb (137 kg).  Urinalysis: Negative   HPI: The patient is being seen today for ongoing management of as above. Today she reports CBG are good   BP weight and urine results all reviewed and noted. Patient reports good fetal movement, denies any bleeding and no rupture of membranes symptoms or regular contractions.  Fundal Height:  U+32 Fetal Heart rate:  146 Edema:  1+  Patient is without complaints other than noted in her HPI. All questions were answered.  All lab and sonogram results have been reviewed. Comments: abnormal:    Assessment:  1.  Pregnancy at [redacted]w[redacted]d,  Estimated Date of Delivery: 03/24/16 :                          2.  Class B DM                        3.  Chronic hypertension                        4.  Polydydrmanios, moderate  Medication(s) Plans:  No changes, glyburide 10hs, 5 am, metformin 1000 BID, lantus 25, labetalol 100 BID, baby ASA  Treatment Plan:  Plan primary Caesarean setion at 38-39 weeks with the moderate polyhydramnios  Return in about 3 days (around 03/10/2016) for NST, HROB, with Dr Despina Hidden. for appointment for high risk OB care  No orders of the defined types were placed in this encounter.  Orders Placed This Encounter  Procedures  . POCT urinalysis dipstick

## 2016-03-07 NOTE — Progress Notes (Signed)
BPP and UA doppler US today @ 37+[redacted] weeks GA. BPP 8/8. AFI 26.1, subjectively polyhadramnios. Fetus is cephalic with a FHR of 134. RI 0.63 and 0.5.

## 2016-03-07 NOTE — Telephone Encounter (Signed)
Preadmission screen  

## 2016-03-08 ENCOUNTER — Telehealth (HOSPITAL_COMMUNITY): Payer: Self-pay | Admitting: *Deleted

## 2016-03-08 NOTE — Telephone Encounter (Signed)
Preadmission screen  

## 2016-03-09 ENCOUNTER — Encounter (HOSPITAL_COMMUNITY): Payer: Self-pay

## 2016-03-10 ENCOUNTER — Ambulatory Visit (INDEPENDENT_AMBULATORY_CARE_PROVIDER_SITE_OTHER): Payer: Medicaid Other | Admitting: Obstetrics & Gynecology

## 2016-03-10 ENCOUNTER — Encounter: Payer: Self-pay | Admitting: Obstetrics & Gynecology

## 2016-03-10 VITALS — BP 100/60 | HR 100 | Wt 304.0 lb

## 2016-03-10 DIAGNOSIS — O24913 Unspecified diabetes mellitus in pregnancy, third trimester: Secondary | ICD-10-CM

## 2016-03-10 DIAGNOSIS — O10913 Unspecified pre-existing hypertension complicating pregnancy, third trimester: Secondary | ICD-10-CM | POA: Diagnosis not present

## 2016-03-10 DIAGNOSIS — O403XX1 Polyhydramnios, third trimester, fetus 1: Secondary | ICD-10-CM

## 2016-03-10 DIAGNOSIS — O3663X1 Maternal care for excessive fetal growth, third trimester, fetus 1: Secondary | ICD-10-CM | POA: Diagnosis not present

## 2016-03-10 DIAGNOSIS — Z331 Pregnant state, incidental: Secondary | ICD-10-CM

## 2016-03-10 DIAGNOSIS — Z1389 Encounter for screening for other disorder: Secondary | ICD-10-CM | POA: Diagnosis not present

## 2016-03-10 DIAGNOSIS — Z3A38 38 weeks gestation of pregnancy: Secondary | ICD-10-CM | POA: Diagnosis not present

## 2016-03-10 DIAGNOSIS — O09893 Supervision of other high risk pregnancies, third trimester: Secondary | ICD-10-CM | POA: Diagnosis not present

## 2016-03-10 DIAGNOSIS — I10 Essential (primary) hypertension: Secondary | ICD-10-CM

## 2016-03-10 LAB — POCT URINALYSIS DIPSTICK
Blood, UA: NEGATIVE
Glucose, UA: NEGATIVE
Ketones, UA: NEGATIVE
Leukocytes, UA: NEGATIVE
Nitrite, UA: NEGATIVE

## 2016-03-10 NOTE — Progress Notes (Signed)
Pt denies any problems or concerns at this time.  

## 2016-03-10 NOTE — Progress Notes (Signed)
Fetal Surveillance Testing today:  Reactive NST   High Risk Pregnancy Diagnosis(es):   Class B diabetes, chronic hypertension, polyhydramnios moderate, fetal macrosomia  G2P1001 [redacted]w[redacted]d Estimated Date of Delivery: 03/24/16  Blood pressure 100/60, pulse 100, weight (!) 304 lb (137.9 kg).  Urinalysis: Negative   HPI: The patient is being seen today for ongoing management of as above. Today she reports CBGs are excellent   BP weight and urine results all reviewed and noted. Patient reports good fetal movement, denies any bleeding and no rupture of membranes symptoms or regular contractions.  Fundal Height:  Umbilicus +32 cm Fetal Heart rate:  130 Edema:  2+  Patient is without complaints other than noted in her HPI. All questions were answered.  All lab and sonogram results have been reviewed. Comments: abnormal:  Assessment:  1.  Pregnancy at [redacted]w[redacted]d,  Estimated Date of Delivery: 03/24/16 :                          2.  Class B diabetes under good control but with fetal macrosomia and polyhydramnios                        3.  Chronic hypertension  Medication(s) Plans:  Glyburide 10 mg at night 5 mg in the morning metformin 1000 twice a day and Lantus 20 5 at night labetalol 100 mg twice a day and a baby aspirin a day  Treatment Plan:  Patient is scheduled for a primary  cesarean section Tuesday, August 8 at 7:30 due to estimated fetal weight 2 weeks ago a 5000 g, with a diagnosis of moderate to severe polyhydramnios depending on the time of the ultrasound and her chronic hypertension and the shear size of the baby we will get her delivered just before 39 weeks, there certainly is plenty of reason to go ahead and get her delivered before the elective time  Return in about 2 weeks (around 03/24/2016) for Post Op, with Dr Despina Hidden. for appointment for high risk OB care  No orders of the defined types were placed in this encounter.  Orders Placed This Encounter  Procedures  . POCT urinalysis  dipstick  . Fetal nonstress test

## 2016-03-14 ENCOUNTER — Encounter (HOSPITAL_COMMUNITY)
Admission: RE | Admit: 2016-03-14 | Discharge: 2016-03-14 | Disposition: A | Discharge status: Home / Self Care | Payer: Medicaid Other | Source: Ambulatory Visit | Attending: Obstetrics & Gynecology | Admitting: Obstetrics & Gynecology

## 2016-03-14 LAB — BASIC METABOLIC PANEL
Anion gap: 10 (ref 5–15)
BUN: 17 mg/dL (ref 6–20)
CO2: 15 mmol/L — ABNORMAL LOW (ref 22–32)
Calcium: 9.3 mg/dL (ref 8.9–10.3)
Chloride: 111 mmol/L (ref 101–111)
Creatinine, Ser: 0.8 mg/dL (ref 0.44–1.00)
GFR calc Af Amer: >60 mL/min (ref >60)
GFR calc non Af Amer: >60 mL/min (ref >60)
Glucose, Bld: 65 mg/dL (ref 65–99)
Potassium: 4.3 mmol/L (ref 3.5–5.1)
Sodium: 136 mmol/L (ref 135–145)

## 2016-03-14 LAB — CBC
HCT: 37.8 % (ref 36.0–46.0)
Hemoglobin: 12.8 g/dL (ref 12.0–15.0)
MCH: 27.5 pg (ref 26.0–34.0)
MCHC: 33.9 g/dL (ref 30.0–36.0)
MCV: 81.3 fL (ref 78.0–100.0)
Platelets: 309 10*3/uL (ref 150–400)
RBC: 4.65 MIL/uL (ref 3.87–5.11)
RDW: 16.7 % — ABNORMAL HIGH (ref 11.5–15.5)
WBC: 10.1 10*3/uL (ref 4.0–10.5)

## 2016-03-14 LAB — TYPE AND SCREEN
ABO/RH(D): O POS
Antibody Screen: NEGATIVE

## 2016-03-14 NOTE — Patient Instructions (Signed)
20 Holly Pope  03/14/2016   Your procedure is scheduled on:  03/15/2016  [facility].  Pick up the phone at the desk and dial [phone].   Call this number if you have problems the morning of surgery: [phone]   Remember:   Do not eat food:After Midnight.  Do not drink clear liquids: After Midnight.  Take these medicines the morning of surgery with A SIP OF WATER: take labetolol in the morning.   Take 12 units of insulin at bedtime, take your glyburide at night.   DO NOT TAKE YOUR METFORMIN TONIGHT OR IN THE MORNING.  DO NOT TAKE GLYBURIDE IN THE MORNING OR ANY OTHER BLOOD SUGAR MEDICATION.   Do not wear jewelry, make-up or nail polish.  Do not wear lotions, powders, or perfumes. You may wear deodorant.  Do not shave 48 hours prior to surgery.  Do not bring valuables to the hospital.  [facility] is not   responsible for any belongings or valuables brought to the hospital.  Contacts, dentures or bridgework may not be worn into surgery.  Leave suitcase in the car. After surgery it may be brought to your room.  For patients admitted to the hospital, checkout time is 11:00 AM the day of              discharge.   Patients discharged the day of surgery will not be allowed to drive             home.  Name and phone number of your driver: NA  Special Instructions:   N/A   Please read over the following fact sheets that you were given:   Surgical Site Infection Prevention

## 2016-03-15 ENCOUNTER — Inpatient Hospital Stay (HOSPITAL_COMMUNITY): Payer: Medicaid Other | Admitting: Anesthesiology

## 2016-03-15 ENCOUNTER — Encounter (HOSPITAL_COMMUNITY)
Admission: RE | Disposition: A | Discharge status: Home / Self Care | Payer: Self-pay | Source: Ambulatory Visit | Attending: Obstetrics & Gynecology

## 2016-03-15 ENCOUNTER — Encounter (HOSPITAL_COMMUNITY): Payer: Self-pay

## 2016-03-15 ENCOUNTER — Inpatient Hospital Stay (HOSPITAL_COMMUNITY)
Admission: RE | Admit: 2016-03-15 | Discharge: 2016-03-17 | DRG: 765 | Disposition: A | Discharge status: Home / Self Care | Payer: Medicaid Other | Source: Ambulatory Visit | Attending: Obstetrics & Gynecology | Admitting: Obstetrics & Gynecology

## 2016-03-15 DIAGNOSIS — O99214 Obesity complicating childbirth: Secondary | ICD-10-CM | POA: Diagnosis present

## 2016-03-15 DIAGNOSIS — O1002 Pre-existing essential hypertension complicating childbirth: Secondary | ICD-10-CM | POA: Diagnosis present

## 2016-03-15 DIAGNOSIS — E119 Type 2 diabetes mellitus without complications: Secondary | ICD-10-CM | POA: Diagnosis present

## 2016-03-15 DIAGNOSIS — Z6841 Body Mass Index (BMI) 40.0 and over, adult: Secondary | ICD-10-CM

## 2016-03-15 DIAGNOSIS — O2412 Pre-existing diabetes mellitus, type 2, in childbirth: Secondary | ICD-10-CM | POA: Diagnosis present

## 2016-03-15 DIAGNOSIS — Z3A38 38 weeks gestation of pregnancy: Secondary | ICD-10-CM

## 2016-03-15 DIAGNOSIS — Z794 Long term (current) use of insulin: Secondary | ICD-10-CM

## 2016-03-15 DIAGNOSIS — Z8249 Family history of ischemic heart disease and other diseases of the circulatory system: Secondary | ICD-10-CM

## 2016-03-15 DIAGNOSIS — O403XX0 Polyhydramnios, third trimester, not applicable or unspecified: Secondary | ICD-10-CM | POA: Diagnosis present

## 2016-03-15 DIAGNOSIS — O09899 Supervision of other high risk pregnancies, unspecified trimester: Secondary | ICD-10-CM

## 2016-03-15 DIAGNOSIS — Z833 Family history of diabetes mellitus: Secondary | ICD-10-CM | POA: Diagnosis not present

## 2016-03-15 DIAGNOSIS — I10 Essential (primary) hypertension: Secondary | ICD-10-CM | POA: Diagnosis present

## 2016-03-15 DIAGNOSIS — Z825 Family history of asthma and other chronic lower respiratory diseases: Secondary | ICD-10-CM | POA: Diagnosis not present

## 2016-03-15 DIAGNOSIS — O3663X0 Maternal care for excessive fetal growth, third trimester, not applicable or unspecified: Principal | ICD-10-CM | POA: Diagnosis present

## 2016-03-15 LAB — GLUCOSE, CAPILLARY
Glucose-Capillary: 67 mg/dL (ref 65–99)
Glucose-Capillary: 75 mg/dL (ref 65–99)
Glucose-Capillary: 96 mg/dL (ref 65–99)

## 2016-03-15 LAB — SYPHILIS: RPR W/REFLEX TO RPR TITER AND TREPONEMAL ANTIBODIES, TRADITIONAL SCREENING AND DIAGNOSIS ALGORITHM: RPR Ser Ql: NONREACTIVE

## 2016-03-15 SURGERY — Surgical Case
Anesthesia: Spinal

## 2016-03-15 MED ORDER — MORPHINE SULFATE-NACL 0.5-0.9 MG/ML-% IV SOSY
PREFILLED_SYRINGE | INTRAVENOUS | Status: AC
  Filled 2016-03-15: qty 1

## 2016-03-15 MED ORDER — PHENYLEPHRINE 8 MG IN D5W 100 ML (0.08MG/ML) PREMIX OPTIME
INJECTION | INTRAVENOUS | Status: DC | PRN
  Administered 2016-03-15: 60 ug/min via INTRAVENOUS

## 2016-03-15 MED ORDER — KETOROLAC TROMETHAMINE 30 MG/ML IJ SOLN
INTRAMUSCULAR | Status: AC
  Filled 2016-03-15: qty 1

## 2016-03-15 MED ORDER — DIPHENHYDRAMINE HCL 50 MG/ML IJ SOLN
INTRAMUSCULAR | Status: AC
  Filled 2016-03-15: qty 1

## 2016-03-15 MED ORDER — NALBUPHINE HCL 10 MG/ML IJ SOLN: 5.0000 mg | INTRAMUSCULAR | Status: DC | PRN

## 2016-03-15 MED ORDER — KETOROLAC TROMETHAMINE 30 MG/ML IJ SOLN
30.0000 mg | Freq: Four times a day (QID) | INTRAMUSCULAR | Status: DC | PRN
  Administered 2016-03-15: 30 mg via INTRAMUSCULAR

## 2016-03-15 MED ORDER — NALBUPHINE HCL 10 MG/ML IJ SOLN
INTRAMUSCULAR | Status: AC
  Filled 2016-03-15: qty 1

## 2016-03-15 MED ORDER — MENTHOL 3 MG MT LOZG: 1.0000 | LOZENGE | OROMUCOSAL | Status: DC | PRN

## 2016-03-15 MED ORDER — MORPHINE SULFATE (PF) 0.5 MG/ML IJ SOLN
INTRAMUSCULAR | Status: DC | PRN
  Administered 2016-03-15: .2 mg via INTRATHECAL

## 2016-03-15 MED ORDER — SIMETHICONE 80 MG PO CHEW: 80.0000 mg | CHEWABLE_TABLET | ORAL | Status: DC | PRN

## 2016-03-15 MED ORDER — COCONUT OIL OIL: 1.0000 "application " | TOPICAL_OIL | Status: DC | PRN

## 2016-03-15 MED ORDER — SODIUM CHLORIDE 0.9% FLUSH: 3.0000 mL | INTRAVENOUS | Status: DC | PRN

## 2016-03-15 MED ORDER — LACTATED RINGERS IV SOLN
INTRAVENOUS | Status: DC
  Administered 2016-03-15 (×2): via INTRAVENOUS

## 2016-03-15 MED ORDER — ZOLPIDEM TARTRATE 5 MG PO TABS
5.0000 mg | ORAL_TABLET | Freq: Every evening | ORAL | Status: DC | PRN
Start: 2016-03-15 — End: 2016-03-17

## 2016-03-15 MED ORDER — METFORMIN HCL 500 MG PO TABS
1000.0000 mg | ORAL_TABLET | Freq: Two times a day (BID) | ORAL | Status: DC
  Administered 2016-03-15 – 2016-03-17 (×4): 1000 mg via ORAL
  Filled 2016-03-15 (×6): qty 2

## 2016-03-15 MED ORDER — ONDANSETRON HCL 4 MG/2ML IJ SOLN
INTRAMUSCULAR | Status: AC
  Filled 2016-03-15: qty 2

## 2016-03-15 MED ORDER — LACTATED RINGERS IV SOLN
INTRAVENOUS | Status: DC | PRN
  Administered 2016-03-15: 40 [IU] via INTRAVENOUS

## 2016-03-15 MED ORDER — SIMETHICONE 80 MG PO CHEW
80.0000 mg | CHEWABLE_TABLET | Freq: Three times a day (TID) | ORAL | Status: DC
  Administered 2016-03-15 – 2016-03-16 (×3): 80 mg via ORAL
  Filled 2016-03-15 (×3): qty 1

## 2016-03-15 MED ORDER — MEPERIDINE HCL 25 MG/ML IJ SOLN: 6.2500 mg | INTRAMUSCULAR | Status: DC | PRN

## 2016-03-15 MED ORDER — DIPHENHYDRAMINE HCL 50 MG/ML IJ SOLN
12.5000 mg | INTRAMUSCULAR | Status: DC | PRN
  Administered 2016-03-15: 12.5 mg via INTRAVENOUS

## 2016-03-15 MED ORDER — BUPIVACAINE IN DEXTROSE 0.75-8.25 % IT SOLN
INTRATHECAL | Status: DC | PRN
  Administered 2016-03-15: 10.5 mg via INTRATHECAL

## 2016-03-15 MED ORDER — SCOPOLAMINE 1 MG/3DAYS TD PT72
MEDICATED_PATCH | TRANSDERMAL | Status: AC
  Administered 2016-03-15: 1.5 mg via TRANSDERMAL
  Filled 2016-03-15: qty 1

## 2016-03-15 MED ORDER — FENTANYL CITRATE (PF) 100 MCG/2ML IJ SOLN
INTRAMUSCULAR | Status: AC
  Filled 2016-03-15: qty 2

## 2016-03-15 MED ORDER — OXYTOCIN 40 UNITS IN LACTATED RINGERS INFUSION - SIMPLE MED: 2.5000 [IU]/h | INTRAVENOUS | Status: AC

## 2016-03-15 MED ORDER — NALOXONE HCL 2 MG/2ML IJ SOSY
1.0000 ug/kg/h | PREFILLED_SYRINGE | INTRAVENOUS | Status: DC | PRN
  Filled 2016-03-15: qty 2

## 2016-03-15 MED ORDER — DEXTROSE 5 % IV SOLN
3.0000 g | INTRAVENOUS | Status: AC
  Administered 2016-03-15: 3 g via INTRAVENOUS
  Filled 2016-03-15: qty 3000

## 2016-03-15 MED ORDER — DIPHENHYDRAMINE HCL 25 MG PO CAPS
25.0000 mg | ORAL_CAPSULE | ORAL | Status: DC | PRN
  Filled 2016-03-15: qty 1

## 2016-03-15 MED ORDER — OXYCODONE-ACETAMINOPHEN 5-325 MG PO TABS
2.0000 | ORAL_TABLET | ORAL | Status: DC | PRN
  Administered 2016-03-16 – 2016-03-17 (×3): 2 via ORAL
  Filled 2016-03-15 (×3): qty 2

## 2016-03-15 MED ORDER — OXYCODONE-ACETAMINOPHEN 5-325 MG PO TABS
1.0000 | ORAL_TABLET | ORAL | Status: DC | PRN
Start: 2016-03-15 — End: 2016-03-17
  Administered 2016-03-15 – 2016-03-16 (×2): 1 via ORAL
  Filled 2016-03-15 (×2): qty 1

## 2016-03-15 MED ORDER — NALBUPHINE HCL 10 MG/ML IJ SOLN
5.0000 mg | Freq: Once | INTRAMUSCULAR | Status: AC | PRN
  Administered 2016-03-15: 5 mg via SUBCUTANEOUS

## 2016-03-15 MED ORDER — PRENATAL MULTIVITAMIN CH
1.0000 | ORAL_TABLET | Freq: Every day | ORAL | Status: DC
  Administered 2016-03-16 – 2016-03-17 (×2): 1 via ORAL
  Filled 2016-03-15 (×2): qty 1

## 2016-03-15 MED ORDER — SIMETHICONE 80 MG PO CHEW
80.0000 mg | CHEWABLE_TABLET | ORAL | Status: DC
  Administered 2016-03-15 – 2016-03-16 (×2): 80 mg via ORAL
  Filled 2016-03-15 (×2): qty 1

## 2016-03-15 MED ORDER — ONDANSETRON HCL 4 MG/2ML IJ SOLN
INTRAMUSCULAR | Status: DC | PRN
  Administered 2016-03-15: 4 mg via INTRAVENOUS

## 2016-03-15 MED ORDER — NALBUPHINE HCL 10 MG/ML IJ SOLN: 5.0000 mg | Freq: Once | INTRAMUSCULAR | Status: AC | PRN

## 2016-03-15 MED ORDER — LACTATED RINGERS IV SOLN
Freq: Once | INTRAVENOUS | Status: AC
  Administered 2016-03-15 (×3): via INTRAVENOUS

## 2016-03-15 MED ORDER — ONDANSETRON HCL 4 MG/2ML IJ SOLN: 4.0000 mg | Freq: Three times a day (TID) | INTRAMUSCULAR | Status: DC | PRN

## 2016-03-15 MED ORDER — DIPHENHYDRAMINE HCL 25 MG PO CAPS: 25.0000 mg | ORAL_CAPSULE | Freq: Four times a day (QID) | ORAL | Status: DC | PRN

## 2016-03-15 MED ORDER — OXYTOCIN 10 UNIT/ML IJ SOLN
INTRAMUSCULAR | Status: AC
  Filled 2016-03-15: qty 1

## 2016-03-15 MED ORDER — SCOPOLAMINE 1 MG/3DAYS TD PT72
1.0000 | MEDICATED_PATCH | Freq: Once | TRANSDERMAL | Status: DC
  Administered 2016-03-15: 1.5 mg via TRANSDERMAL

## 2016-03-15 MED ORDER — PHENYLEPHRINE 40 MCG/ML (10ML) SYRINGE FOR IV PUSH (FOR BLOOD PRESSURE SUPPORT)
PREFILLED_SYRINGE | INTRAVENOUS | Status: AC
  Filled 2016-03-15: qty 10

## 2016-03-15 MED ORDER — FENTANYL CITRATE (PF) 100 MCG/2ML IJ SOLN
INTRAMUSCULAR | Status: DC | PRN
  Administered 2016-03-15: 10 ug via INTRATHECAL

## 2016-03-15 MED ORDER — TETANUS-DIPHTH-ACELL PERTUSSIS 5-2.5-18.5 LF-MCG/0.5 IM SUSP
0.5000 mL | Freq: Once | INTRAMUSCULAR | Status: AC
  Administered 2016-03-17: 0.5 mL via INTRAMUSCULAR
  Filled 2016-03-15: qty 0.5

## 2016-03-15 MED ORDER — NALOXONE HCL 0.4 MG/ML IJ SOLN: 0.4000 mg | INTRAMUSCULAR | Status: DC | PRN

## 2016-03-15 MED ORDER — LACTATED RINGERS IV SOLN: INTRAVENOUS | Status: DC

## 2016-03-15 MED ORDER — WITCH HAZEL-GLYCERIN EX PADS: 1.0000 "application " | MEDICATED_PAD | CUTANEOUS | Status: DC | PRN

## 2016-03-15 MED ORDER — FENTANYL CITRATE (PF) 100 MCG/2ML IJ SOLN: 25.0000 ug | INTRAMUSCULAR | Status: DC | PRN

## 2016-03-15 MED ORDER — PHENYLEPHRINE 8 MG IN D5W 100 ML (0.08MG/ML) PREMIX OPTIME
INJECTION | INTRAVENOUS | Status: AC
  Filled 2016-03-15: qty 100

## 2016-03-15 MED ORDER — PHENYLEPHRINE 40 MCG/ML (10ML) SYRINGE FOR IV PUSH (FOR BLOOD PRESSURE SUPPORT)
PREFILLED_SYRINGE | INTRAVENOUS | Status: DC | PRN
  Administered 2016-03-15 (×2): 40 ug via INTRAVENOUS

## 2016-03-15 MED ORDER — KETOROLAC TROMETHAMINE 30 MG/ML IJ SOLN: 30.0000 mg | Freq: Four times a day (QID) | INTRAMUSCULAR | Status: DC | PRN

## 2016-03-15 MED ORDER — SODIUM CHLORIDE 0.9 % IR SOLN
Status: DC | PRN
  Administered 2016-03-15: 1

## 2016-03-15 MED ORDER — ACETAMINOPHEN 325 MG PO TABS
650.0000 mg | ORAL_TABLET | ORAL | Status: DC | PRN
  Administered 2016-03-15: 650 mg via ORAL
  Filled 2016-03-15: qty 2

## 2016-03-15 MED ORDER — SENNOSIDES-DOCUSATE SODIUM 8.6-50 MG PO TABS
2.0000 | ORAL_TABLET | ORAL | Status: DC
  Administered 2016-03-15 – 2016-03-16 (×2): 2 via ORAL
  Filled 2016-03-15 (×2): qty 2

## 2016-03-15 MED ORDER — IBUPROFEN 600 MG PO TABS
600.0000 mg | ORAL_TABLET | Freq: Four times a day (QID) | ORAL | Status: DC
  Administered 2016-03-15 – 2016-03-17 (×8): 600 mg via ORAL
  Filled 2016-03-15 (×8): qty 1

## 2016-03-15 MED ORDER — DIBUCAINE 1 % RE OINT: 1.0000 "application " | TOPICAL_OINTMENT | RECTAL | Status: DC | PRN

## 2016-03-15 SURGICAL SUPPLY — 38 items
CHLORAPREP W/TINT 26ML (MISCELLANEOUS) ×6 IMPLANT
CLAMP CORD UMBIL (MISCELLANEOUS) IMPLANT
CLOTH BEACON ORANGE TIMEOUT ST (SAFETY) ×3 IMPLANT
DRSG OPSITE POSTOP 4X10 (GAUZE/BANDAGES/DRESSINGS) ×3 IMPLANT
ELECT REM PT RETURN 9FT ADLT (ELECTROSURGICAL) ×3
ELECTRODE REM PT RTRN 9FT ADLT (ELECTROSURGICAL) ×1 IMPLANT
EXTRACTOR VACUUM BELL STYLE (SUCTIONS) ×2 IMPLANT
GLOVE BIOGEL PI IND STRL 7.0 (GLOVE) ×1 IMPLANT
GLOVE BIOGEL PI IND STRL 8 (GLOVE) ×1 IMPLANT
GLOVE BIOGEL PI INDICATOR 7.0 (GLOVE) ×2
GLOVE BIOGEL PI INDICATOR 8 (GLOVE) ×2
GLOVE ECLIPSE 8.0 STRL XLNG CF (GLOVE) ×3 IMPLANT
GOWN STRL REUS W/TWL LRG LVL3 (GOWN DISPOSABLE) ×6 IMPLANT
KIT ABG SYR 3ML LUER SLIP (SYRINGE) ×3 IMPLANT
LIQUID BAND (GAUZE/BANDAGES/DRESSINGS) ×4 IMPLANT
NDL HYPO 18GX1.5 BLUNT FILL (NEEDLE) ×1 IMPLANT
NDL HYPO 25X5/8 SAFETYGLIDE (NEEDLE) ×1 IMPLANT
NEEDLE HYPO 18GX1.5 BLUNT FILL (NEEDLE) ×3 IMPLANT
NEEDLE HYPO 22GX1.5 SAFETY (NEEDLE) ×3 IMPLANT
NEEDLE HYPO 25X5/8 SAFETYGLIDE (NEEDLE) ×3 IMPLANT
NS IRRIG 1000ML POUR BTL (IV SOLUTION) ×3 IMPLANT
PACK C SECTION WH (CUSTOM PROCEDURE TRAY) ×3 IMPLANT
PAD OB MATERNITY 4.3X12.25 (PERSONAL CARE ITEMS) ×3 IMPLANT
PENCIL SMOKE EVAC W/HOLSTER (ELECTROSURGICAL) ×3 IMPLANT
RTRCTR C-SECT PINK 25CM LRG (MISCELLANEOUS) IMPLANT
STAPLER VISISTAT 35W (STAPLE) ×2 IMPLANT
SUT CHROMIC 0 CT 1 (SUTURE) ×3 IMPLANT
SUT MNCRL 0 VIOLET CTX 36 (SUTURE) ×2 IMPLANT
SUT MONOCRYL 0 CTX 36 (SUTURE) ×4
SUT PLAIN 2 0 (SUTURE)
SUT PLAIN 2 0 XLH (SUTURE) IMPLANT
SUT PLAIN ABS 2-0 CT1 27XMFL (SUTURE) IMPLANT
SUT VIC AB 0 CTX 36 (SUTURE) ×3
SUT VIC AB 0 CTX36XBRD ANBCTRL (SUTURE) ×1 IMPLANT
SUT VIC AB 4-0 KS 27 (SUTURE) IMPLANT
SYR 20CC LL (SYRINGE) ×6 IMPLANT
TOWEL OR 17X24 6PK STRL BLUE (TOWEL DISPOSABLE) ×3 IMPLANT
TRAY FOLEY CATH SILVER 14FR (SET/KITS/TRAYS/PACK) IMPLANT

## 2016-03-15 NOTE — Consult Note (Signed)
Neonatology Note:   Attendance at C-section:    I was asked by Dr. Eure to attend this C/S at 38 5/7 week due to macrosomia. The mother is a 25 y.o. G2P1001 with pregnancy complicated by Class B DM with associated moderate to severe polyhydramnios, chronic hypertension and morbid obesity.  Blood sugar reportedly under good control.  GBS not done. ROM 0 hours before delivery, fluid clear. Infant vigorous with good spontaneous cry and tone. Needed only minimal bulb suctioning. Ap 8/9. Lungs clear to ausc in DR. To CN to care of Pediatrician.  David C. Ehrmann, MD 

## 2016-03-15 NOTE — Anesthesia Postprocedure Evaluation (Signed)
Anesthesia Post Note  Patient: Holly Pope  Procedure(s) Performed: Procedure(s) (LRB): PRIMARY CESAREAN SECTION (N/A)  Patient location during evaluation: Mother Baby Anesthesia Type: Spinal Level of consciousness: awake Pain management: satisfactory to patient Vital Signs Assessment: post-procedure vital signs reviewed and stable Respiratory status: spontaneous breathing Cardiovascular status: stable Anesthetic complications: no     Last Vitals:  Vitals:   03/15/16 1310 03/15/16 1415  BP: 102/61 (!) 102/59  Pulse: 87 70  Resp: 18 20  Temp: 37.2 C 36.8 C    Last Pain:  Vitals:   03/15/16 1415  TempSrc: Oral   Pain Goal: Patients Stated Pain Goal: 3 (03/15/16 16100608)               Cephus ShellingBURGER,Kensie Susman

## 2016-03-15 NOTE — Anesthesia Preprocedure Evaluation (Signed)
Anesthesia Evaluation  Patient identified by MRN, date of birth, ID band Patient awake    Reviewed: Allergy & Precautions, NPO status , Patient's Chart, lab work & pertinent test results  Airway Mallampati: III  TM Distance: >3 FB Neck ROM: Full    Dental no notable dental hx.    Pulmonary neg pulmonary ROS,    Pulmonary exam normal breath sounds clear to auscultation       Cardiovascular hypertension, Pt. on medications and Pt. on home beta blockers Normal cardiovascular exam Rhythm:Regular Rate:Normal     Neuro/Psych negative neurological ROS  negative psych ROS   GI/Hepatic negative GI ROS, Neg liver ROS,   Endo/Other  diabetes, Type 2, Insulin Dependent, Oral Hypoglycemic Agents  Renal/GU negative Renal ROS  negative genitourinary   Musculoskeletal negative musculoskeletal ROS (+)   Abdominal   Peds negative pediatric ROS (+)  Hematology negative hematology ROS (+)   Anesthesia Other Findings   Reproductive/Obstetrics (+) Pregnancy                             Anesthesia Physical Anesthesia Plan  ASA: III  Anesthesia Plan: Spinal   Post-op Pain Management:    Induction:   Airway Management Planned: Natural Airway  Additional Equipment:   Intra-op Plan:   Post-operative Plan:   Informed Consent: I have reviewed the patients History and Physical, chart, labs and discussed the procedure including the risks, benefits and alternatives for the proposed anesthesia with the patient or authorized representative who has indicated his/her understanding and acceptance.     Plan Discussed with:   Anesthesia Plan Comments: (previous dental surgery with ETT, Grade 1 view.)        Anesthesia Quick Evaluation

## 2016-03-15 NOTE — Anesthesia Procedure Notes (Signed)
Spinal  Patient location during procedure: OR Staffing Anesthesiologist: Montez Hageman Performed: anesthesiologist  Preanesthetic Checklist Completed: patient identified, site marked, surgical consent, pre-op evaluation, timeout performed, IV checked, risks and benefits discussed and monitors and equipment checked Spinal Block Patient position: sitting Prep: ChloraPrep Patient monitoring: heart rate, continuous pulse ox and blood pressure Approach: midline Location: L4-5 Injection technique: single-shot Needle Needle type: Sprotte and Quincke  Needle gauge: 22 G Needle length: 15 cm Additional Notes Expiration date of kit checked and confirmed. Patient tolerated procedure well, without complications.  Difficult placement due to body habitus. Several attempts with standard needle, unable to reach os. Success with long 22G.

## 2016-03-15 NOTE — Transfer of Care (Signed)
Immediate Anesthesia Transfer of Care Note  Patient: Holly Pope  Procedure(s) Performed: Procedure(s): PRIMARY CESAREAN SECTION (N/A)  Patient Location: PACU  Anesthesia Type:Spinal  Level of Consciousness: awake  Airway & Oxygen Therapy: Patient Spontanous Breathing  Post-op Assessment: Report given to RN and Post -op Vital signs reviewed and stable  Post vital signs: Reviewed and stable  Last Vitals:  Vitals:   03/15/16 0608  BP: 121/79  Pulse: 94  Resp: (!) 22  Temp: 36.7 C    Last Pain:  Vitals:   03/15/16 0608  TempSrc: Oral      Patients Stated Pain Goal: 3 (03/15/16 16100608)  Complications: No apparent anesthesia complications

## 2016-03-15 NOTE — H&P (Signed)
Preoperative History and Physical  Holly Pope is a 25 y.o. G2P1001 with No LMP recorded (lmp unknown). Patient is pregnant. admitted for a primary Caesarean section for suspected fetal macrosomia.  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 Constipation 08/17/2015  . Diabetes mellitus without complication (HCC)   . H/O removal of neck cyst   . Hip dislocation, right (HCC) 05/06/2012  . Hypertension   . Morbidly obese (HCC)     PSH:     Past Surgical History:  Procedure Laterality Date  . CYSTECTOMY  2002   rmoval from neck  . TOOTH EXTRACTION N/A 11/11/2013   Procedure: EXTRACT 3RD MOLARS PLUS ONE;  Surgeon: Georgia Lopes, DDS;  Location: [facility] OR;  Service: Oral Surgery;  Laterality: N/A;    POb/GynH:      OB History    Gravida Para Term Preterm AB Living   2 1 1     1    SAB TAB Ectopic Multiple Live Births           1      SH:   Social History  Substance Use Topics  . Smoking status: Never Smoker  . Smokeless tobacco: Never Used  . Alcohol use No    FH:    Family History  Problem Relation Age of Onset  . Heart disease Mother   . Hypertension Mother   . Diabetes Mother   . Heart disease Father   . Hypertension Father   . Hypertension Brother   . Diabetes Brother   . Heart disease Brother   . Asthma Daughter      Allergies: No Known Allergies  Medications:       Current Facility-Administered Medications:  .  ceFAZolin (ANCEF) 3 g in dextrose 5 % 50 mL IVPB, 3 g, Intravenous, On Call to OR, Lazaro Arms, MD .  lactated ringers infusion, , Intravenous, Once, Phillips Grout, MD .  lactated ringers infusion, , Intravenous, Continuous, Phillips Grout, MD .  scopolamine (TRANSDERM-SCOP) 1 MG/3DAYS 1.5 mg, 1 patch, Transdermal, Once, Phillips Grout, MD, 1.5 mg at 03/15/16 4540 .  sodium  chloride irrigation 0.9 %, , , PRN, Lazaro Arms, MD, 1 application at 03/15/16 0710  Review of Systems:   Review of Systems  Constitutional: Negative for fever, chills, weight loss, malaise/fatigue and diaphoresis.  HENT: Negative for hearing loss, ear pain, nosebleeds, congestion, sore throat, neck pain, tinnitus and ear discharge.   Eyes: Negative for blurred vision, double vision, photophobia, pain, discharge and redness.  Respiratory: Negative for cough, hemoptysis, sputum production, shortness of breath, wheezing and stridor.   Cardiovascular: Negative for chest pain, palpitations, orthopnea, claudication, leg swelling and PND.  Gastrointestinal: Positive for abdominal pain. Negative for heartburn, nausea, vomiting, diarrhea, constipation, blood in stool and melena.  Genitourinary: Negative for dysuria, urgency, frequency, hematuria and flank pain.  Musculoskeletal: Negative for myalgias, back pain, joint pain and falls.  Skin: Negative for itching and rash.  Neurological: Negative for dizziness, tingling, tremors, sensory change, speech change, focal weakness, seizures, loss of consciousness, weakness and headaches.  Endo/Heme/Allergies: Negative for environmental allergies and polydipsia. Does not bruise/bleed easily.  Psychiatric/Behavioral: Negative for depression, suicidal ideas, hallucinations, memory loss and substance abuse. The patient is not nervous/anxious and does not have insomnia.      PHYSICAL EXAM:  Blood pressure 121/79, pulse 94, temperature 98 F (36.7 C), temperature source Oral, resp. rate (!) 22, SpO2 99 %.    Vitals reviewed. Constitutional: She is oriented to person, place, and time. She appears well-developed and well-nourished.  HENT:  Head: Normocephalic and atraumatic.  Right Ear: External ear normal.  Left Ear: External ear normal.  Nose: Nose normal.  Mouth/Throat: Oropharynx is clear and moist.  Eyes: Conjunctivae and EOM are normal. Pupils are  equal, round, and reactive to light. Right eye exhibits no discharge. Left eye exhibits no discharge. No scleral icterus.  Neck: Normal range of motion. Neck supple. No tracheal deviation present. No thyromegaly present.  Cardiovascular: Normal rate, regular rhythm, normal heart sounds and intact distal pulses.  Exam reveals no gallop and no friction rub.   No murmur heard. Respiratory: Effort normal and breath sounds normal. No respiratory distress. She has no wheezes. She has no rales. She exhibits no tenderness.  GI: Soft. Bowel sounds are normal. She exhibits no distension and no mass. There is tenderness. There is no rebound and no guarding.  Genitourinary:       Vulva is normal without lesions Vagina is pink moist without discharge Cervix normal in appearance and pap is normal Uterus is FH 53 cm Adnexa is negative with normal sized ovaries by sonogram  Musculoskeletal: Normal range of motion. She exhibits no edema and no tenderness.  Neurological: She is alert and oriented to person, place, and time. She has normal reflexes. She displays normal reflexes. No cranial nerve deficit. She exhibits normal muscle tone. Coordination normal.  Skin: Skin is warm and dry. No rash noted. No erythema. No pallor.  Psychiatric: She has a normal mood and affect. Her behavior is normal. Judgment and thought content normal.    Labs: Results for orders placed or performed during the hospital encounter of 03/15/16 (from the past 336 hour(s))  Glucose, capillary   Collection Time: 03/15/16  6:05 AM  Result Value Ref Range   Glucose-Capillary 67 65 - 99 mg/dL  Results for orders placed or performed during the hospital encounter of 03/14/16 (from the past 336 hour(s))  CBC   Collection Time: 03/14/16  9:30 AM  Result Value Ref Range   WBC 10.1 4.0 - 10.5 K/uL   RBC 4.65 3.87 - 5.11 MIL/uL   Hemoglobin 12.8 12.0 - 15.0 g/dL   HCT 16.137.8 09.636.0 - 04.546.0 %   MCV 81.3 78.0 - 100.0 fL   MCH 27.5 26.0 - 34.0 pg    MCHC 33.9 30.0 - 36.0 g/dL   RDW 40.916.7 (H) 81.111.5 - 91.415.5 %   Platelets 309 150 - 400 K/uL  RPR   Collection Time: 03/14/16  9:30 AM  Result Value Ref Range   RPR Ser Ql Non Reactive Non Reactive  Basic metabolic panel   Collection Time: 03/14/16  9:30 AM  Result Value Ref Range   Sodium 136 135 - 145 mmol/L   Potassium 4.3 3.5 - 5.1 mmol/L   Chloride 111 101 - 111 mmol/L   CO2 15 (L) 22 - 32 mmol/L   Glucose, Bld 65 65 - 99 mg/dL   BUN 17 6 - 20 mg/dL   Creatinine, Ser 7.820.80 0.44 - 1.00 mg/dL   Calcium 9.3 8.9 - 95.610.3 mg/dL   GFR calc non Af Amer >60 >60 mL/min   GFR calc Af Amer >60 >60 mL/min   Anion gap 10 5 - 15  Type and screen   Collection Time: 03/14/16  9:30 AM  Result Value Ref Range   ABO/RH(D) O POS    Antibody Screen NEG    Sample Expiration 03/17/2016   Results for orders placed or performed in visit on 03/10/16 (from the past 336 hour(s))  POCT urinalysis dipstick   Collection Time: 03/10/16  9:07 AM  Result Value Ref Range   Color, UA     Clarity, UA     Glucose, UA neg    Bilirubin, UA     Ketones, UA neg    Spec Grav, UA     Blood, UA neg    pH, UA     Protein, UA trace    Urobilinogen, UA     Nitrite, UA neg    Leukocytes, UA Negative Negative  Results for orders placed or performed in visit on 03/07/16 (from the past 336 hour(s))  POCT urinalysis dipstick   Collection Time: 03/07/16  3:37 PM  Result Value Ref Range   Color, UA gold    Clarity, UA cloudy    Glucose, UA neg    Bilirubin, UA     Ketones, UA neg    Spec Grav, UA     Blood, UA neg    pH, UA     Protein, UA trace    Urobilinogen, UA     Nitrite, UA neg    Leukocytes, UA Negative Negative  Results for orders placed or performed in visit on 03/03/16 (from the past 336 hour(s))  POCT urinalysis dipstick   Collection Time: 03/03/16  1:59 PM  Result Value Ref Range   Color, UA     Clarity, UA     Glucose, UA neg    Bilirubin, UA     Ketones, UA neg    Spec Grav, UA     Blood,  UA neg    pH, UA     Protein, UA 1    Urobilinogen, UA     Nitrite, UA neg    Leukocytes, UA Negative Negative    EKG: Orders placed or performed during the hospital encounter of 11/07/13  . EKG 12 lead  . EKG 12 lead    Imaging Studies: US Ob Follow Up  Result Date: 02/25/2016 FOLLOW UP SONOGRAM Holly Pope is in the office for a follow up sonogram for EFW,BPP,and cord doppler. She is a 25 y.o. year old G2P1001 with Estimated Date of Delivery: 03/24/16 by early ultrasound now at  [redacted]w[redacted]d weeks gestation. Thus far the pregnancy has been complicated by HTN,diabetes,obesity.. GESTATION: SINGLETON PRESENTATION: cephalic FETAL ACTIVITY:          Heart rate         128          The fetus is active. AMNIOTIC FLUID: The amniotic fluid volume is  abnormal, 35 cm. (polyhydramnios) PLACENTA LOCALIZATION:  posterior GRADE 3 CERVIX: Limited view ADNEXA: wnl GESTATIONAL AGE AND  BIOMETRICS: Gestational criteria: Estimated Date of Delivery: 03/24/16 by early ultrasound now at [redacted]w[redacted]d Previous Scans:8          BIPARIETAL DIAMETER           9.61 cm         39+2 weeks >97% HEAD CIRCUMFERENCE           34.30 cm         39+4 weeks 93% ABDOMINAL CIRCUMFERENCE           41.70 cm         >97% OOR FEMUR LENGTH  7.09 cm         54.6 weeks  54.6%                                                       AVERAGE EGA(BY THIS SCAN):  38+3 weeks                                                 ESTIMATED FETAL WEIGHT:       4984  grams, >90 % BIOPHYSICAL PROFILE:                                                                                                      COMMENTS GROSS BODY MOVEMENT                 2  TONE                2  RESPIRATIONS                2  AMNIOTIC FLUID                2                                                          SCORE:  8/8 (Note: NST was not performed as part of this antepartum testing) DOPPLER FLOW STUDIES: UMBILICAL ARTERY RI RATIOS:   0.54, 0.52 ANATOMICAL SURVEY                                                                             COMMENTS CEREBRAL VENTRICLES    CHOROID PLEXUS    CEREBELLUM    CISTERNA MAGNA    NUCHAL REGION    ORBITS    NASAL BONE    NOSE/LIP    FACIAL PROFILE    4 CHAMBERED HEART    OUTFLOW TRACTS    DIAPHRAGM yes normal  STOMACH yes normal  RENAL REGION yes normal  BLADDER yes normal  CORD INSERTION    3 VESSEL CORD    SPINE    ARMS/HANDS    LEGS/FEET    GENITALIA yes normal female     SUSPECTED ABNORMALITIES:  yes EFW >90%,AFI 35 CM QUALITY OF SCAN: Limited ultrasound because of pt body habitus TECHNICIAN COMMENTS: [place] 36  wks,cephalic,fhr 128 bpm,bilat adnexa's wnl,post pl gr 3,afi 35 cm (polyhydramnios),RI .54,.52,EFW 4984 g >90%,BPP 8/8,limited head measurement because of fetal pos. A copy of this report including all images has been saved and backed up to a second source for retrieval if needed. All measures and details of the anatomical scan, placentation, fluid volume and pelvic anatomy are contained in that report. Amber Flora Lipps 02/25/2016 11:31 AM Clinical Impression and recommendations: I have reviewed the sonogram results above, combined with the patient's current clinical course, below are my impressions and any appropriate recommendations for management based on the sonographic findings. 1.  G2P1001 Estimated Date of Delivery: 03/24/16 by serial sonographic evaluations 2.  Fetal sonographic surveillance findings: a). polyhydramnios b). Normal antepartum fetal assessment with BPP 8/8 c). Normal fetal Doppler ratios with consistent diastolic flow d). Macrosomia, suspected, 5000 gram EFW at 36 weeks 3.  Normal general sonographic findings Recommend continued prenatal evaluations and care based on this sonogram and as clinically indicated from the patient's clinical course. Lazaro Arms 02/25/2016 11:47 AM   US Fetal Bpp W/o Non Stress  Result Date: 03/07/2016 BIOPHYSCIAL PROFILE:                                                                                                       COMMENTS GROSS BODY MOVEMENT                 2  TONE                2  RESPIRATIONS                2  AMNIOTIC FLUID                2                                                          SCORE:  8/8 (Note: NST was not performed as part of this antepartum testing) DOPPLER FLOW STUDIES: UMBILICAL ARTERY RI RATIOS:   0.63, 0.59 TECH COMMENTS: BPP and UA doppler US today @ 37+[redacted] weeks GA. BPP 8/8. AFI 26.1, subjectively polyhadramnios. Fetus is cephalic with a FHR of 134. RI 0.63 and 0.5. Percell Boston, RDMS 03/07/16 3:30pm  US Fetal Bpp W/o Non Stress  Result Date: 02/25/2016 FOLLOW UP SONOGRAM Holly Pope is in the office for a follow up sonogram for EFW,BPP,and cord doppler. She is a 25 y.o. year old G2P1001 with Estimated Date of Delivery: 03/24/16 by early ultrasound now at  [redacted]w[redacted]d weeks gestation. Thus far the pregnancy has been complicated by HTN,diabetes,obesity.. GESTATION: SINGLETON PRESENTATION: cephalic FETAL ACTIVITY:          Heart rate         128          The fetus is active. AMNIOTIC FLUID: The amniotic fluid volume is  abnormal, 35 cm. (polyhydramnios)  PLACENTA LOCALIZATION:  posterior GRADE 3 CERVIX: Limited view ADNEXA: wnl GESTATIONAL AGE AND  BIOMETRICS: Gestational criteria: Estimated Date of Delivery: 03/24/16 by early ultrasound now at [redacted]w[redacted]d Previous Scans:8          BIPARIETAL DIAMETER           9.61 cm         39+2 weeks >97% HEAD CIRCUMFERENCE           34.30 cm         39+4 weeks 93% ABDOMINAL CIRCUMFERENCE           41.70 cm         >97% OOR FEMUR LENGTH           7.09 cm         54.6 weeks  54.6%                                                       AVERAGE EGA(BY THIS SCAN):  38+3 weeks                                                 ESTIMATED FETAL WEIGHT:       4984  grams, >90 % BIOPHYSICAL PROFILE:                                                                                                      COMMENTS GROSS BODY MOVEMENT                 2  TONE                 2  RESPIRATIONS                2  AMNIOTIC FLUID                2                                                          SCORE:  8/8 (Note: NST was not performed as part of this antepartum testing) DOPPLER FLOW STUDIES: UMBILICAL ARTERY RI RATIOS:   0.54, 0.52 ANATOMICAL SURVEY  COMMENTS CEREBRAL VENTRICLES    CHOROID PLEXUS    CEREBELLUM    CISTERNA MAGNA    NUCHAL REGION    ORBITS    NASAL BONE    NOSE/LIP    FACIAL PROFILE    4 CHAMBERED HEART    OUTFLOW TRACTS    DIAPHRAGM yes normal  STOMACH yes normal  RENAL REGION yes normal  BLADDER yes normal  CORD INSERTION    3 VESSEL CORD    SPINE    ARMS/HANDS    LEGS/FEET    GENITALIA yes normal female     SUSPECTED ABNORMALITIES:  yes EFW >90%,AFI 35 CM QUALITY OF SCAN: Limited ultrasound because of pt body habitus TECHNICIAN COMMENTS: [place] 36 wks,cephalic,fhr 128 bpm,bilat adnexa's wnl,post pl gr 3,afi 35 cm (polyhydramnios),RI .54,.52,EFW 4984 g >90%,BPP 8/8,limited head measurement because of fetal pos. A copy of this report including all images has been saved and backed up to a second source for retrieval if needed. All measures and details of the anatomical scan, placentation, fluid volume and pelvic anatomy are contained in that report. Amber Flora Lipps 02/25/2016 11:31 AM Clinical Impression and recommendations: I have reviewed the sonogram results above, combined with the patient's current clinical course, below are my impressions and any appropriate recommendations for management based on the sonographic findings. 1.  G2P1001 Estimated Date of Delivery: 03/24/16 by serial sonographic evaluations 2.  Fetal sonographic surveillance findings: a). polyhydramnios b). Normal antepartum fetal assessment with BPP 8/8 c). Normal fetal Doppler ratios with consistent diastolic flow d). Macrosomia, suspected, 5000 gram EFW at 36 weeks 3.  Normal general sonographic findings Recommend continued  prenatal evaluations and care based on this sonogram and as clinically indicated from the patient's clinical course. Elizabelle Fite H 02/25/2016 11:47 AM   US Fetal Bpp W/o Non Stress  Result Date: 02/18/2016 FOLLOW UP SONOGRAM DLISA BARNWELL is in the office for a follow up sonogram for BPP and cord dopplers. She is a 25 y.o. year old G2P1001 with Estimated Date of Delivery: 03/24/16 by early ultrasound now at  [redacted]w[redacted]d weeks gestation. Thus far the pregnancy has been complicated by obesity,diabetes,CHTN.. GESTATION: SINGLETON PRESENTATION: Transverse head rt FETAL ACTIVITY:          Heart rate         146          The fetus is active. AMNIOTIC FLUID: The amniotic fluid volume is  abnormal - 24.9cm,poly. PLACENTA LOCALIZATION:  posterior GRADE 3 CERVIX: Limited view GESTATIONAL AGE AND  BIOMETRICS: Gestational criteria: Estimated Date of Delivery: 03/24/16 by early ultrasound now at [redacted]w[redacted]d Previous Scans:7 BIOPHYSCIAL PROFILE:                                                                                                      COMMENTS GROSS BODY MOVEMENT                 2  TONE                2  RESPIRATIONS  2  AMNIOTIC FLUID                2                                                          SCORE:  8/8 (Note: NST was not performed as part of this antepartum testing) DOPPLER FLOW STUDIES: UMBILICAL ARTERY RI RATIOS:   0.64, 0.61 ANATOMICAL SURVEY                                                                            COMMENTS CEREBRAL VENTRICLES no  Limited view  CHOROID PLEXUS no  Limited view  CEREBELLUM    CISTERNA MAGNA    NUCHAL REGION    ORBITS    NASAL BONE    NOSE/LIP    FACIAL PROFILE yes normal  4 CHAMBERED HEART no  Limited view  OUTFLOW TRACTS    DIAPHRAGM yes normal  STOMACH yes normal  RENAL REGION yes normal  BLADDER yes normal  CORD INSERTION yes normal  3 VESSEL CORD yes normal  SPINE    ARMS/HANDS    LEGS/FEET    GENITALIA yes normal female     SUSPECTED ABNORMALITIES:  yes/  AFI 24.9 cm,skin edema QUALITY OF SCAN: satisfactory TECHNICIAN COMMENTS: [place] 35 wks,trans.head rt, skin edema(abd,scalp,arms) ,fhr 146 bpm,post pl gr 3,RI .64,.61,polyhydramnios 24.9cm,BPP 8/8 A copy of this report including all images has been saved and backed up to a second source for retrieval if needed. All measures and details of the anatomical scan, placentation, fluid volume and pelvic anatomy are contained in that report. Amber Flora Lipps 02/18/2016 11:43 AM Clinical Impression and recommendations: I have reviewed the sonogram results above, combined with the patient's current clinical course, below are my impressions and any appropriate recommendations for management based on the sonographic findings. 1.  G2P1001 Estimated Date of Delivery: 03/24/16 by serial sonographic evaluations 2.  Fetal sonographic surveillance findings: a). Mild polyhydramnios b). Normal antepartum fetal assessment with BPP 8/8 c). Normal fetal Doppler ratios with consistent diastolic flow 3.  Normal general sonographic findings Recommend continued prenatal evaluations and care based on this sonogram and as clinically indicated from the patient's clinical course. Lazaro Arms 02/18/2016 12:02 PM   [place] Ua Cord Doppler  Result Date: 03/07/2016 BIOPHYSCIAL PROFILE:                                                                                                      COMMENTS GROSS BODY MOVEMENT                 2  TONE                2  RESPIRATIONS                2  AMNIOTIC FLUID                2                                                          SCORE:  8/8 (Note: NST was not performed as part of this antepartum testing) DOPPLER FLOW STUDIES: UMBILICAL ARTERY RI RATIOS:   0.63, 0.59 TECH COMMENTS: BPP and UA doppler US today @ 37+[redacted] weeks GA. BPP 8/8. AFI 26.1, subjectively polyhadramnios. Fetus is cephalic with a FHR of 134. RI 0.63 and 0.5. Percell Boston, RDMS 03/07/16 3:30pm  [place] Ua Cord Doppler  Result Date: 02/25/2016 FOLLOW UP  SONOGRAM Holly Pope is in the office for a follow up sonogram for EFW,BPP,and cord doppler. She is a 25 y.o. year old G2P1001 with Estimated Date of Delivery: 03/24/16 by early ultrasound now at  [redacted]w[redacted]d weeks gestation. Thus far the pregnancy has been complicated by HTN,diabetes,obesity.. GESTATION: SINGLETON PRESENTATION: cephalic FETAL ACTIVITY:          Heart rate         128          The fetus is active. AMNIOTIC FLUID: The amniotic fluid volume is  abnormal, 35 cm. (polyhydramnios) PLACENTA LOCALIZATION:  posterior GRADE 3 CERVIX: Limited view ADNEXA: wnl GESTATIONAL AGE AND  BIOMETRICS: Gestational criteria: Estimated Date of Delivery: 03/24/16 by early ultrasound now at [redacted]w[redacted]d Previous Scans:8          BIPARIETAL DIAMETER           9.61 cm         39+2 weeks >97% HEAD CIRCUMFERENCE           34.30 cm         39+4 weeks 93% ABDOMINAL CIRCUMFERENCE           41.70 cm         >97% OOR FEMUR LENGTH           7.09 cm         54.6 weeks  54.6%                                                       AVERAGE EGA(BY THIS SCAN):  38+3 weeks                                                 ESTIMATED FETAL WEIGHT:       4984  grams, >90 % BIOPHYSICAL PROFILE:  COMMENTS GROSS BODY MOVEMENT                 2  TONE                2  RESPIRATIONS                2  AMNIOTIC FLUID                2                                                          SCORE:  8/8 (Note: NST was not performed as part of this antepartum testing) DOPPLER FLOW STUDIES: UMBILICAL ARTERY RI RATIOS:   0.54, 0.52 ANATOMICAL SURVEY                                                                            COMMENTS CEREBRAL VENTRICLES    CHOROID PLEXUS    CEREBELLUM    CISTERNA MAGNA    NUCHAL REGION    ORBITS    NASAL BONE    NOSE/LIP    FACIAL PROFILE    4 CHAMBERED HEART    OUTFLOW TRACTS    DIAPHRAGM yes normal  STOMACH yes normal  RENAL REGION yes  normal  BLADDER yes normal  CORD INSERTION    3 VESSEL CORD    SPINE    ARMS/HANDS    LEGS/FEET    GENITALIA yes normal female     SUSPECTED ABNORMALITIES:  yes EFW >90%,AFI 35 CM QUALITY OF SCAN: Limited ultrasound because of pt body habitus TECHNICIAN COMMENTS: [place] 36 wks,cephalic,fhr 128 bpm,bilat adnexa's wnl,post pl gr 3,afi 35 cm (polyhydramnios),RI .54,.52,EFW 4984 g >90%,BPP 8/8,limited head measurement because of fetal pos. A copy of this report including all images has been saved and backed up to a second source for retrieval if needed. All measures and details of the anatomical scan, placentation, fluid volume and pelvic anatomy are contained in that report. Amber Flora Lipps 02/25/2016 11:31 AM Clinical Impression and recommendations: I have reviewed the sonogram results above, combined with the patient's current clinical course, below are my impressions and any appropriate recommendations for management based on the sonographic findings. 1.  G2P1001 Estimated Date of Delivery: 03/24/16 by serial sonographic evaluations 2.  Fetal sonographic surveillance findings: a). polyhydramnios b). Normal antepartum fetal assessment with BPP 8/8 c). Normal fetal Doppler ratios with consistent diastolic flow d). Macrosomia, suspected, 5000 gram EFW at 36 weeks 3.  Normal general sonographic findings Recommend continued prenatal evaluations and care based on this sonogram and as clinically indicated from the patient's clinical course. Lazaro Arms 02/25/2016 11:47 AM   [place] Ua Cord Doppler  Result Date: 02/18/2016 FOLLOW UP SONOGRAM Holly Pope is in the office for a follow up sonogram for BPP and cord dopplers. She is a 25 y.o. year old G2P1001 with Estimated Date of Delivery: 03/24/16 by early ultrasound now at  [redacted]w[redacted]d weeks gestation. Thus far the pregnancy has been complicated by obesity,diabetes,CHTN.Marland Kitchen  GESTATION: SINGLETON PRESENTATION: Transverse head rt FETAL ACTIVITY:          Heart rate         146          The  fetus is active. AMNIOTIC FLUID: The amniotic fluid volume is  abnormal - 24.9cm,poly. PLACENTA LOCALIZATION:  posterior GRADE 3 CERVIX: Limited view GESTATIONAL AGE AND  BIOMETRICS: Gestational criteria: Estimated Date of Delivery: 03/24/16 by early ultrasound now at [redacted]w[redacted]d Previous Scans:7 BIOPHYSCIAL PROFILE:                                                                                                      COMMENTS GROSS BODY MOVEMENT                 2  TONE                2  RESPIRATIONS                2  AMNIOTIC FLUID                2                                                          SCORE:  8/8 (Note: NST was not performed as part of this antepartum testing) DOPPLER FLOW STUDIES: UMBILICAL ARTERY RI RATIOS:   0.64, 0.61 ANATOMICAL SURVEY                                                                            COMMENTS CEREBRAL VENTRICLES no  Limited view  CHOROID PLEXUS no  Limited view  CEREBELLUM    CISTERNA MAGNA    NUCHAL REGION    ORBITS    NASAL BONE    NOSE/LIP    FACIAL PROFILE yes normal  4 CHAMBERED HEART no  Limited view  OUTFLOW TRACTS    DIAPHRAGM yes normal  STOMACH yes normal  RENAL REGION yes normal  BLADDER yes normal  CORD INSERTION yes normal  3 VESSEL CORD yes normal  SPINE    ARMS/HANDS    LEGS/FEET    GENITALIA yes normal female     SUSPECTED ABNORMALITIES:  yes/ AFI 24.9 cm,skin edema QUALITY OF SCAN: satisfactory TECHNICIAN COMMENTS: [place] 35 wks,trans.head rt, skin edema(abd,scalp,arms) ,fhr 146 bpm,post pl gr 3,RI .64,.61,polyhydramnios 24.9cm,BPP 8/8 A copy of this report including all images has been saved and backed up to a second source for retrieval if needed. All measures and details of the anatomical scan, placentation, fluid volume and pelvic anatomy are contained in that report. Amber J  Baldo Ash 02/18/2016 11:43 AM Clinical Impression and recommendations: I have reviewed the sonogram results above, combined with the patient's current clinical course, below are my  impressions and any appropriate recommendations for management based on the sonographic findings. 1.  G2P1001 Estimated Date of Delivery: 03/24/16 by serial sonographic evaluations 2.  Fetal sonographic surveillance findings: a). Mild polyhydramnios b). Normal antepartum fetal assessment with BPP 8/8 c). Normal fetal Doppler ratios with consistent diastolic flow 3.  Normal general sonographic findings Recommend continued prenatal evaluations and care based on this sonogram and as clinically indicated from the patient's clinical course. Lazaro Arms 02/18/2016 12:02 PM      Assessment: [redacted]w[redacted]d Estimated Date of Delivery: 03/24/16 Class B Diabetes Chronic Hypertension Suspected macrosomia with severe polyhydramnios, EFW >5500 grams Patient Active Problem List   Diagnosis Date Noted  . First trimester bleeding 08/17/2015  . Constipation 08/17/2015  . Supervision of other high-risk pregnancy 08/06/2015  . Diabetes mellitus without complication (HCC)   . Hypertension   . Hip dislocation, right (HCC) 05/06/2012    Plan: Primary Caesarean section  Zyree Traynham H 03/15/2016 7:11 AM   Prenatal Transfer Tool  Maternal Diabetes: Yes:  Diabetes Type:  Pre-pregnancy, Insulin/Medication controlled Genetic Screening: Normal Maternal Ultrasounds/Referrals: Abnormal:  Findings:   Other:macrosomia with polyhydramnios Fetal Ultrasounds or other Referrals:  Fetal echo Maternal Substance Abuse:  No Significant Maternal Medications:  Meds include: Other: Labetalol Significant Maternal Lab Results: None

## 2016-03-15 NOTE — Op Note (Signed)
Preoperative diagnosis:  1.  Intrauterine pregnancy at 38 5/[redacted] weeks gestation                                         2.  Suspected fetal macrosomia                                         3.  Class B Diabetic                                         4.  Polyhydramnios                                         5. Chronic Hypertension   Postoperative diagnosis:  Same as above, plus confirmed macrosomia  Procedure:  Primary cesarean section  Surgeon:  Lazaro ArmsLuther H Eure MD  Assistant:  Jen MowElizabeth Vaishnav Demartin, DO  Anesthesia: Spinal  Findings:  Macrosomia baby, polyhydramnios.    Over a low transverse incision was delivered a viable female with Apgars of 8 and 9 weighing 11 lbs. 10 oz. Uterus, tubes and ovaries were all normal.  There were no other significant findings  Description of operation:  Patient was taken to the operating room and placed in the sitting position where she underwent a spinal anesthetic. She was then placed in the supine position with tilt to the left side. When adequate anesthetic level was obtained she was prepped and draped in usual sterile fashion and a Foley catheter was placed. A Pfannenstiel skin incision was made and carried down sharply to the rectus fascia which was scored in the midline extended laterally. The fascia was taken off the muscles both superiorly and without difficulty. The muscles were divided.  The peritoneal cavity was entered.  Bladder blade was placed, no bladder flap was created.  A low transverse hysterotomy incision was made and delivered a viable female  infant at 08:02 with Apgars of 8 and 9 weighing11 lbs 10 oz. The uterus was exteriorized. It was closed in 2 layers, the first being a running interlocking layer and the second being an imbricating layer using 0 monocryl on a CTX needle. There was good resulting hemostasis. The uterus tubes and ovaries were all normal. Peritoneal cavity was irrigated vigorously. The muscles and peritoneum were reapproximated  loosely. The fascia was closed using 0 Vicryl in running fashion. Subcutaneous tissue was made hemostatic and irrigated. The skin was closed using 4-0 Vicryl on a Keith needle in a subcuticular fashion.  Dermabond was placed for additional wound integrity and to serve as a barrier. Blood loss for the procedure was 600 cc. The patient received a gram of Ancef prophylactically. The patient was taken to the recovery room in good stable condition with all counts being correct x3.  EBL: 600 cc IVF: 2300 cc URINE: 300 cc clear urine output   Jen MowElizabeth Cyrena Kuchenbecker, DO 03/15/2016 10:48 AM

## 2016-03-16 LAB — CBC
HCT: 31.5 % — ABNORMAL LOW (ref 36.0–46.0)
Hemoglobin: 10.4 g/dL — ABNORMAL LOW (ref 12.0–15.0)
MCH: 26.9 pg (ref 26.0–34.0)
MCHC: 33 g/dL (ref 30.0–36.0)
MCV: 81.4 fL (ref 78.0–100.0)
Platelets: 259 10*3/uL (ref 150–400)
RBC: 3.87 MIL/uL (ref 3.87–5.11)
RDW: 16.7 % — ABNORMAL HIGH (ref 11.5–15.5)
WBC: 9.3 10*3/uL (ref 4.0–10.5)

## 2016-03-16 LAB — GLUCOSE, CAPILLARY
Glucose-Capillary: 105 mg/dL — ABNORMAL HIGH (ref 65–99)
Glucose-Capillary: 98 mg/dL (ref 65–99)
Glucose-Capillary: 125 mg/dL — ABNORMAL HIGH (ref 65–99)

## 2016-03-16 NOTE — Progress Notes (Signed)
Post Operative Day 1   Subjective: Pain (improving), no other complaints, has not yet been up post-op, + flatus but not yet voiding  Objective: Blood pressure (!) 109/58, pulse 75, temperature 98.9 F (37.2 C), resp. rate 18, SpO2 98 %, unknown if currently breastfeeding.  Physical Exam:  General: alert, cooperative and no distress Lochia: appropriate Uterine Fundus: firm Incision: staples in place, dressing C/D/I DVT Evaluation: No evidence of DVT seen on physical exam.   Recent Labs (last 2 labs)    Recent Labs  03/14/16 0930 03/16/16 0454  HGB 12.8 10.4*  HCT 37.8 31.5*      Assessment/Plan: 25 y/o G2P2002 delivered via PLTCS, POD#1. CBG 105 this AM continue metformin.  Plan for discharge tomorrow. Formula feeding. Not interested in contraception.  Ambulation today.   LOS: 1 day   Holly PennaNicholas Schenk MD 03/16/2016, 7:54 AM

## 2016-03-16 NOTE — Progress Notes (Signed)
Post Operative Day 1   Subjective: Pain (improving), no other complaints, has not yet been up post-op, + flatus but not yet voiding  Objective: Blood pressure (!) 109/58, pulse 75, temperature 98.9 F (37.2 C), resp. rate 18, SpO2 98 %, unknown if currently breastfeeding.  Physical Exam:  General: alert, cooperative and no distress Lochia: appropriate Uterine Fundus: firm Incision: staples in place DVT Evaluation: No evidence of DVT seen on physical exam.   Recent Labs  03/14/16 0930 03/16/16 0454  HGB 12.8 10.4*  HCT 37.8 31.5*    Assessment/Plan: 25 y/o G2P2002 delivered via PLTCS, POD#1. Plan for discharge tomorrow. Formula feeding. Not interested in contraception.  Ambulation today.   LOS: 1 day   Ambrose FinlandGabriela E Reed 03/16/2016, 7:48 AM    OB FELLOW MEDICAL STUDENT NOTE ATTESTATION  I have seen and examined this patient. Note this is a Psychologist, occupationalmedical student note and as such does not necessarily reflect the patient's plan of care. Please see note for this date of service from Dr. Genevie AnnSchenk.    Ernestina Pennaicholas Jarrett Albor 03/16/2016, 1:26 PM

## 2016-03-17 LAB — GLUCOSE, CAPILLARY
Glucose-Capillary: 99 mg/dL (ref 65–99)
Glucose-Capillary: 84 mg/dL (ref 65–99)

## 2016-03-17 MED ORDER — DOCUSATE SODIUM 100 MG PO CAPS: 100.0000 mg | ORAL_CAPSULE | Freq: Two times a day (BID) | ORAL | 0 refills | Status: DC

## 2016-03-17 MED ORDER — OXYCODONE-ACETAMINOPHEN 5-325 MG PO TABS: 1.0000 | ORAL_TABLET | ORAL | 0 refills | Status: DC | PRN

## 2016-03-17 MED ORDER — IBUPROFEN 600 MG PO TABS: 600.0000 mg | ORAL_TABLET | Freq: Four times a day (QID) | ORAL | 0 refills | Status: DC

## 2016-03-17 MED ORDER — METFORMIN HCL 1000 MG PO TABS: 1000.0000 mg | ORAL_TABLET | Freq: Two times a day (BID) | ORAL | 2 refills | Status: AC

## 2016-03-17 NOTE — Discharge Summary (Signed)
OB Discharge Summary    Patient Name: Holly Pope DOB: 1991-02-21 MRN: [id number]  Date of admission: 03/15/2016 Delivering MD: Duane Lope H   Date of discharge: 03/17/2016  Admitting diagnosis: FETAL MACROSOMIA Intrauterine pregnancy: [redacted]w[redacted]d     Secondary diagnosis:  Active Problems:   Supervision of other high-risk pregnancy   Diabetes mellitus without complication (HCC)   Hypertension   Cesarean delivery delivered  Additional problems: none     Discharge diagnosis: Term Pregnancy Delivered and GDMB                                                                                                Post partum procedures:none  Augmentation: none  Complications: None  Hospital course:  Sceduled C/S   25 y.o. yo G2P2002 at [redacted]w[redacted]d was admitted to the hospital 03/15/2016 for scheduled cesarean section with the following indication:Elective Repeat. Fetal macrosomia,  Holly Pope is a 25 y.o. G2P1001 with No LMP recorded (lmp unknown). Patient is pregnant. admitted for a primary Caesarean section for suspected fetal macrosomia.  Class B DM with associated moderate to severe polyhydramnios, AFI 26-35 cm, with chronic hypertension Membrane Rupture Time/Date: 8:00 AM ,03/15/2016   Patient delivered a Viable infant.03/15/2016  Details of operation can be found in separate operative note.  Pateint had an uncomplicated postpartum course.  She is ambulating, tolerating a regular diet, passing flatus, and urinating well. Patient is discharged home in stable condition on  03/17/16          Physical exam  Vitals:   03/16/16 0242 03/16/16 0610 03/16/16 1800 03/17/16 0555  BP: 107/69 (!) 109/58 117/65 110/65  Pulse: 88 75 73 84  Resp: Temp: 98.9 F (37.2 C) 98.9 F (37.2 C) 97.7 F (36.5 C) 98.7 F (37.1 C)  TempSrc: Oral  Oral   SpO2: 98% 98%     General: alert, cooperative and no distress Lochia: appropriate Uterine Fundus: firm Incision: Dressing is clean, dry,  and intact DVT Evaluation: No evidence of DVT seen on physical exam. Labs: Lab Results  Component Value Date   WBC 9.3 03/16/2016   HGB 10.4 (L) 03/16/2016   HCT 31.5 (L) 03/16/2016   MCV 81.4 03/16/2016   PLT 259 03/16/2016   CMP Latest Ref Rng & Units 03/14/2016  Glucose 65 - 99 mg/dL 65  BUN 6 - 20 mg/dL 17  Creatinine 3.24 - 4.01 mg/dL 0.27  Sodium 253 - 664 mmol/L 136  Potassium 3.5 - 5.1 mmol/L 4.3  Chloride 101 - 111 mmol/L 111  CO2 22 - 32 mmol/L 15(L)  Calcium 8.9 - 10.3 mg/dL 9.3  Total Protein 6.0 - 8.5 g/dL -  Total Bilirubin 0.0 - 1.2 mg/dL -  Alkaline Phos 39 - 403 IU/L -  AST 0 - 40 IU/L -  ALT 0 - 32 IU/L -    Discharge instruction: per After Visit Summary and "Baby and Me Booklet".  After visit meds:    Medication List    STOP taking these medications   aspirin EC 81 MG tablet   glyBURIDE  5 MG tablet Commonly known as:  DIABETA   Insulin Glargine 100 UNIT/ML Solostar Pen Commonly known as:  LANTUS   labetalol 100 MG tablet Commonly known as:  NORMODYNE     TAKE these medications   docusate sodium 100 MG capsule Commonly known as:  COLACE Take 1 capsule (100 mg total) by mouth 2 (two) times daily.   ibuprofen 600 MG tablet Commonly known as:  ADVIL,MOTRIN Take 1 tablet (600 mg total) by mouth every 6 (six) hours.   metFORMIN 1000 MG tablet Commonly known as:  GLUCOPHAGE Take 1 tablet (1,000 mg total) by mouth 2 (two) times daily with a meal.   oxyCODONE-acetaminophen 5-325 MG tablet Commonly known as:  PERCOCET/ROXICET Take 1 tablet by mouth every 4 (four) hours as needed (pain scale 4-7).   prenatal vitamin w/FE, FA 27-1 MG Tabs tablet One tablet by mouth daily       Diet: carb modified diet  Activity: Advance as tolerated. Pelvic rest for 6 weeks.   Outpatient follow up: 03/18/16 12pm with Dr. Despina HiddenEure to remove PICO and staples Follow up Appt: Future Appointments Date Time Provider Department Center  03/18/2016 12:00 PM Lazaro ArmsLuther H  Eure, MD [facility]  03/24/2016 10:15 AM Lazaro ArmsLuther H Eure, MD [facility]   Follow up Visit:No Follow-up on file.  Postpartum contraception: None  Newborn Data: Live born female  Birth Weight: 11 lb 10.8 oz (5295 g) APGAR: 8, 9  Baby Feeding: Bottle Disposition:home with mother   03/17/2016 Loni MuseKate Timberlake, MD  CNM attestation I have seen and examined this patient and agree with above documentation in the resident's note.   Holly Pope is a 25 y.o. [id number] s/p pLTCS for macrosomia.   Pain is well controlled.  Plan for birth control is no method.  Method of Feeding: bottle  PE:  BP 110/65   Pulse 84   Temp 98.7 F (37.1 C)   Resp 18   LMP  (LMP Unknown)   SpO2 98%   Breastfeeding? Unknown  Fundus firm   Recent Labs  03/14/16 0930 03/16/16 0454  HGB 12.8 10.4*  HCT 37.8 31.5*     Plan: discharge today - postpartum care discussed [facility]/eval of PICO dsg and incision   Cam HaiSHAW, KIMBERLY, CNM 8:59 AM  03/17/2016

## 2016-03-17 NOTE — Discharge Instructions (Signed)
[facility] to have your PICO dressing and staples removed with Dr. Despina HiddenEure.  Cesarean Delivery, Care After Refer to this sheet in the next few weeks. These instructions provide you with information on caring for yourself after your procedure. Your health care provider may also give you specific instructions. Your treatment has been planned according to current medical practices, but problems sometimes occur. Call your health care provider if you have any problems or questions after you go home. HOME CARE INSTRUCTIONS  Only take over-the-counter or prescription medications as directed by your health care provider.  Do not drink alcohol, especially if you are breastfeeding or taking medication to relieve pain.  Do not chew or smoke tobacco.  Continue to use good perineal care. Good perineal care includes:  Wiping your perineum from front to back.  Keeping your perineum clean.  Check your surgical cut (incision) daily for increased redness, drainage, swelling, or separation of skin.  Clean your incision gently with soap and water every day, and then pat it dry. If your health care provider says it is okay, leave the incision uncovered. Use a bandage (dressing) if the incision is draining fluid or appears irritated. If the adhesive strips across the incision do not fall off within 7 days, carefully peel them off.  Hug a pillow when coughing or sneezing until your incision is healed. This helps to relieve pain.  Do not use tampons or douche until your health care provider says it is okay.  Shower, wash your hair, and take tub baths as directed by your health care provider.  Wear a well-fitting bra that provides breast support.  Limit wearing support panties or control-top hose.  Drink enough fluids to keep your urine clear or pale yellow.  Eat high-fiber foods such as whole grain cereals and breads, brown rice, beans, and fresh fruits and vegetables every day. These  foods may help prevent or relieve constipation.  Resume activities such as climbing stairs, driving, lifting, exercising, or traveling as directed by your health care provider.  Talk to your health care provider about resuming sexual activities. This is dependent upon your risk of infection, your rate of healing, and your comfort and desire to resume sexual activity.  Try to have someone help you with your household activities and your newborn for at least a few days after you leave the hospital.  Rest as much as possible. Try to rest or take a nap when your newborn is sleeping.  Increase your activities gradually.  Keep all of your scheduled postpartum appointments. It is very important to keep your scheduled follow-up appointments. At these appointments, your health care provider will be checking to make sure that you are healing physically and emotionally. SEEK MEDICAL CARE IF:   You are passing large clots from your vagina. Save any clots to show your health care provider.  You have a foul smelling discharge from your vagina.  You have trouble urinating.  You are urinating frequently.  You have pain when you urinate.  You have a change in your bowel movements.  You have increasing redness, pain, or swelling near your incision.  You have pus draining from your incision.  Your incision is separating.  You have painful, hard, or reddened breasts.  You have a severe headache.  You have blurred vision or see spots.  You feel sad or depressed.  You have thoughts of hurting yourself or your newborn.  You have questions about your care, the  care of your newborn, or medications.  You are dizzy or light-headed.  You have a rash.  You have pain, redness, or swelling at the site of the removed intravenous access (IV) tube.  You have nausea or vomiting.  You stopped breastfeeding and have not had a menstrual period within 12 weeks of stopping.  You are not breastfeeding  and have not had a menstrual period within 12 weeks of delivery.  You have a fever. SEEK IMMEDIATE MEDICAL CARE IF:  You have persistent pain.  You have chest pain.  You have shortness of breath.  You faint.  You have leg pain.  You have stomach pain.  Your vaginal bleeding saturates 2 or more sanitary pads in 1 hour. MAKE SURE YOU:   Understand these instructions.  Will watch your condition.  Will get help right away if you are not doing well or get worse.   This information is not intended to replace advice given to you by your health care provider. Make sure you discuss any questions you have with your health care provider.   Document Released: 04/16/2002 Document Revised: 08/15/2014 Document Reviewed: 03/21/2012 Elsevier Interactive Patient Education [employer]

## 2016-03-18 ENCOUNTER — Encounter: Payer: Self-pay | Admitting: Obstetrics & Gynecology

## 2016-03-18 ENCOUNTER — Ambulatory Visit (INDEPENDENT_AMBULATORY_CARE_PROVIDER_SITE_OTHER): Payer: Medicaid Other | Admitting: Obstetrics & Gynecology

## 2016-03-18 VITALS — BP 142/88 | HR 90 | Ht 62.0 in | Wt 283.0 lb

## 2016-03-18 DIAGNOSIS — O2492 Unspecified diabetes mellitus in childbirth: Secondary | ICD-10-CM

## 2016-03-18 DIAGNOSIS — Z98891 History of uterine scar from previous surgery: Secondary | ICD-10-CM

## 2016-03-18 DIAGNOSIS — Z09 Encounter for follow-up examination after completed treatment for conditions other than malignant neoplasm: Secondary | ICD-10-CM

## 2016-03-18 NOTE — Progress Notes (Signed)
  HPI: Patient returns for routine postoperative follow-up having undergone primary Caesarean section  on 03/15/2016.  The patient's immediate postoperative recovery has been unremarkable. Since hospital discharge the patient reports no problems, PICO in place and working.   Current Outpatient Prescriptions: docusate sodium (COLACE) 100 MG capsule, Take 1 capsule (100 mg total) by mouth 2 (two) times daily., Disp: 30 capsule, Rfl: 0 ibuprofen (ADVIL,MOTRIN) 600 MG tablet, Take 1 tablet (600 mg total) by mouth every 6 (six) hours., Disp: 30 tablet, Rfl: 0 metFORMIN (GLUCOPHAGE) 1000 MG tablet, Take 1 tablet (1,000 mg total) by mouth 2 (two) times daily with a meal., Disp: 60 tablet, Rfl: 2 oxyCODONE-acetaminophen (PERCOCET/ROXICET) 5-325 MG tablet, Take 1 tablet by mouth every 4 (four) hours as needed (pain scale 4-7)., Disp: 30 tablet, Rfl: 0 prenatal vitamin w/FE, FA (PRENATAL 1 + 1) 27-1 MG TABS tablet, One tablet by mouth daily, Disp: 30 each, Rfl: 11  No current facility-administered medications for this visit.     Blood pressure (!) 142/88, pulse 90, height 5\' 2"  (1.575 m), weight 283 lb (128.4 kg), not currently breastfeeding.  Physical Exam: Dressing dry and PICO working  Diagnostic Tests: none  Pathology: negative  Impression: S/P primary section for fetal macrosomia 11lb 11oz  Plan: Follow up Tuesday for post op visit to remove PICO nd begin to remove some staples  Follow up: 4  days  Lazaro ArmsEURE,LUTHER H, MD

## 2016-03-24 ENCOUNTER — Ambulatory Visit (INDEPENDENT_AMBULATORY_CARE_PROVIDER_SITE_OTHER): Payer: Medicaid Other | Admitting: Obstetrics & Gynecology

## 2016-03-24 ENCOUNTER — Encounter: Payer: Self-pay | Admitting: Obstetrics & Gynecology

## 2016-03-24 VITALS — BP 138/70 | HR 74 | Wt 288.0 lb

## 2016-03-24 DIAGNOSIS — Z98891 History of uterine scar from previous surgery: Secondary | ICD-10-CM

## 2016-03-29 ENCOUNTER — Ambulatory Visit (INDEPENDENT_AMBULATORY_CARE_PROVIDER_SITE_OTHER): Payer: Medicaid Other | Admitting: Obstetrics & Gynecology

## 2016-03-29 ENCOUNTER — Encounter: Payer: Self-pay | Admitting: Obstetrics & Gynecology

## 2016-03-29 VITALS — BP 80/60 | HR 72 | Wt 276.0 lb

## 2016-03-29 DIAGNOSIS — B379 Candidiasis, unspecified: Secondary | ICD-10-CM

## 2016-03-29 DIAGNOSIS — Z98891 History of uterine scar from previous surgery: Secondary | ICD-10-CM

## 2016-03-29 NOTE — Progress Notes (Signed)
  HPI: Patient returns for routine postoperative follow-up having undergone primary Caesarean section 03/15/2016  The patient's immediate postoperative recovery has been unremarkable. Since hospital discharge the patient reports no problems.   Current Outpatient Prescriptions: docusate sodium (COLACE) 100 MG capsule, Take 1 capsule (100 mg total) by mouth 2 (two) times daily., Disp: 30 capsule, Rfl: 0 ibuprofen (ADVIL,MOTRIN) 600 MG tablet, Take 1 tablet (600 mg total) by mouth every 6 (six) hours., Disp: 30 tablet, Rfl: 0 metFORMIN (GLUCOPHAGE) 1000 MG tablet, Take 1 tablet (1,000 mg total) by mouth 2 (two) times daily with a meal., Disp: 60 tablet, Rfl: 2 oxyCODONE-acetaminophen (PERCOCET/ROXICET) 5-325 MG tablet, Take 1 tablet by mouth every 4 (four) hours as needed (pain scale 4-7)., Disp: 30 tablet, Rfl: 0 prenatal vitamin w/FE, FA (PRENATAL 1 + 1) 27-1 MG TABS tablet, One tablet by mouth daily, Disp: 30 each, Rfl: 11  No current facility-administered medications for this visit.     Blood pressure (!) 80/60, pulse 72, weight 276 lb (125.2 kg), not currently breastfeeding.  Physical Exam: Incision with yeast, the rest of the staples are removed as well, some seperation in right corner nothing significant, treated with gentian violet  Diagnostic Tests:   Pathology:   Impression: Yeast of the incision  Plan: See back in 2 weeks to evaluate the yeast of the skin of the incision  Follow up: 2  weeks  Lazaro ArmsEURE,LUTHER H, MD

## 2016-04-12 ENCOUNTER — Encounter: Payer: Self-pay | Admitting: Obstetrics & Gynecology

## 2016-04-12 ENCOUNTER — Ambulatory Visit (INDEPENDENT_AMBULATORY_CARE_PROVIDER_SITE_OTHER): Payer: Medicaid Other | Admitting: Obstetrics & Gynecology

## 2016-04-12 VITALS — BP 124/78 | HR 80 | Ht 63.0 in | Wt 264.5 lb

## 2016-04-12 DIAGNOSIS — Z3009 Encounter for other general counseling and advice on contraception: Secondary | ICD-10-CM

## 2016-04-12 DIAGNOSIS — B379 Candidiasis, unspecified: Secondary | ICD-10-CM

## 2016-04-12 NOTE — Progress Notes (Signed)
Blood pressure 124/78, pulse 80, height 5\' 3"  (1.6 m), weight 264 lb 8 oz (120 kg), not currently breastfeeding.   Pt without complaints   Incision is clean dry intact  No yeast today  Follow up 4 weeks pp exam, pt states she is not going to use BCM, discussed OCP nexplanon depo IUD nuva ring condoms     Face to face time:  10 minutes  Greater than 50% of the visit time was spent in counseling and coordination of care with the patient.  The summary and outline of the counseling and care coordination is summarized in the note above.   All questions were answered.

## 2016-05-01 NOTE — Progress Notes (Signed)
  HPI: Patient returns for routine postoperative follow-up having undergone repat Caesarean section on 03/17/2016.  The patient's immediate postoperative recovery has been unremarkable. Since hospital discharge the patient reports no problems.   Current Outpatient Prescriptions: docusate sodium (COLACE) 100 MG capsule, Take 1 capsule (100 mg total) by mouth 2 (two) times daily., Disp: 30 capsule, Rfl: 0 metFORMIN (GLUCOPHAGE) 1000 MG tablet, Take 1 tablet (1,000 mg total) by mouth 2 (two) times daily with a meal., Disp: 60 tablet, Rfl: 2 prenatal vitamin w/FE, FA (PRENATAL 1 + 1) 27-1 MG TABS tablet, One tablet by mouth daily, Disp: 30 each, Rfl: 11  No current facility-administered medications for this visit.     Blood pressure 138/70, pulse 74, weight 288 lb (130.6 kg), not currently breastfeeding.  Physical Exam: Incision clean dry intact  Diagnostic Tests: none  Pathology: benign  Impression: S/p primary C section for fetal macrosomia  Plan:   Follow up: Return in about 1 week (around 03/31/2016).   Lazaro ArmsEURE,Nilza Eaker H, MD

## 2016-05-10 ENCOUNTER — Ambulatory Visit (INDEPENDENT_AMBULATORY_CARE_PROVIDER_SITE_OTHER): Payer: Medicaid Other | Admitting: Women's Health

## 2016-05-10 ENCOUNTER — Encounter: Payer: Self-pay | Admitting: Women's Health

## 2016-05-10 DIAGNOSIS — E119 Type 2 diabetes mellitus without complications: Secondary | ICD-10-CM

## 2016-05-10 DIAGNOSIS — Z3202 Encounter for pregnancy test, result negative: Secondary | ICD-10-CM

## 2016-05-10 DIAGNOSIS — I10 Essential (primary) hypertension: Secondary | ICD-10-CM

## 2016-05-10 DIAGNOSIS — Z8759 Personal history of other complications of pregnancy, childbirth and the puerperium: Secondary | ICD-10-CM

## 2016-05-10 LAB — POCT URINE PREGNANCY: Preg Test, Ur: NEGATIVE

## 2016-05-10 NOTE — Progress Notes (Signed)
Subjective:    Holly Pope is a 25 y.o. [id number] Caucasian female who presents for a postpartum visit. 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 Anesthesia: spinal. Baby weighed 11lb10oz. I have fully reviewed the prenatal and intrapartum course. Postpartum course has been uncomplicated. PCP Dr. Polly CobiaHasanji in [place], last saw him in Aug after c/s, [facility] states she has resumed pre-preg meds, is on 1g metformin bid-both conditions managed by PCP. Baby's course has been uncomplicated. Baby is feeding by bottle. Bleeding no bleeding. Bowel function is normal. Bladder function is normal. Patient is sexually active. Last sexual activity: last night. Contraception method is none and wants to have another baby. Postpartum depression screening: positive. Score 13.  Unable to read to complete EPDS, Leah had to read questions to pt. Denies depression, eating/sleeping well, still finds joy in things she used to, denies SI/HI/II, feels it is more anxiety, used to see [facility].  Last pap 08/06/15 and was neg.  The following portions of the patient's history were reviewed and updated as appropriate: allergies, current medications, past medical history, past surgical history and problem list.  Review of Systems Pertinent items are noted in HPI.   Vitals:   05/10/16 1001  BP: 122/66  Pulse: 84  Weight: 257 lb (116.6 kg)   No LMP recorded.  Objective:   General:  alert, cooperative and no distress   Breasts:  deferred, no complaints  Lungs: clear to auscultation bilaterally  Heart:  regular rate and rhythm  Abdomen: soft, nontender, incision well-healed   Vulva: normal  Vagina: normal vagina  Cervix:  closed  Corpus: Well-involuted  Adnexa:  Non-palpable  Rectal Exam: No hemorrhoids        Assessment:   Postpartum exam 8 wks s/p PLTCS for  macrosmia/classBDM Bottlefeeding Obesity CHTN ClassBDM Depression screening Contraception counseling  Illiteracy Anxiety  Plan:   Contraception: advised condoms, recommended waiting at least 6mths before getting pregnant since last delivery, but preferably 18mths  Continue pnv daily if not using condoms Continue f/u's w/ PCP for management of CHTN and ClassB DM Recommended contacting [facility] to re-establish care w/ them  Follow up in: Jan for physical, or earlier if needed  Marge DuncansBooker, Joniah Bednarski Randall CNM, [facility]NP[facility]BC 05/10/2016 10:28 AM

## 2016-06-01 DIAGNOSIS — Z029 Encounter for administrative examinations, unspecified: Secondary | ICD-10-CM

## 2016-08-10 ENCOUNTER — Encounter (HOSPITAL_COMMUNITY): Payer: Self-pay | Admitting: Emergency Medicine

## 2016-08-10 ENCOUNTER — Emergency Department (HOSPITAL_COMMUNITY)
Admission: EM | Admit: 2016-08-10 | Discharge: 2016-08-10 | Disposition: A | Discharge status: Home / Self Care | Payer: Medicaid Other | Attending: Emergency Medicine | Admitting: Emergency Medicine

## 2016-08-10 DIAGNOSIS — J069 Acute upper respiratory infection, unspecified: Secondary | ICD-10-CM | POA: Diagnosis not present

## 2016-08-10 DIAGNOSIS — I10 Essential (primary) hypertension: Secondary | ICD-10-CM | POA: Diagnosis not present

## 2016-08-10 DIAGNOSIS — J019 Acute sinusitis, unspecified: Secondary | ICD-10-CM | POA: Diagnosis not present

## 2016-08-10 DIAGNOSIS — Z7984 Long term (current) use of oral hypoglycemic drugs: Secondary | ICD-10-CM | POA: Diagnosis not present

## 2016-08-10 DIAGNOSIS — E119 Type 2 diabetes mellitus without complications: Secondary | ICD-10-CM | POA: Insufficient documentation

## 2016-08-10 DIAGNOSIS — Z79899 Other long term (current) drug therapy: Secondary | ICD-10-CM | POA: Diagnosis not present

## 2016-08-10 DIAGNOSIS — R0981 Nasal congestion: Secondary | ICD-10-CM | POA: Diagnosis present

## 2016-08-10 MED ORDER — AMOXICILLIN 500 MG PO CAPS: 500.0000 mg | ORAL_CAPSULE | Freq: Three times a day (TID) | ORAL | 0 refills | Status: DC

## 2016-08-10 MED ORDER — IBUPROFEN 800 MG PO TABS: 800.0000 mg | ORAL_TABLET | Freq: Three times a day (TID) | ORAL | 0 refills | Status: DC

## 2016-08-10 MED ORDER — IBUPROFEN 800 MG PO TABS
800.0000 mg | ORAL_TABLET | Freq: Once | ORAL | Status: AC
  Administered 2016-08-10: 800 mg via ORAL
  Filled 2016-08-10: qty 1

## 2016-08-10 MED ORDER — PSEUDOEPHEDRINE HCL 60 MG PO TABS
60.0000 mg | ORAL_TABLET | Freq: Once | ORAL | Status: AC
Start: 2016-08-10 — End: 2016-08-10
  Administered 2016-08-10: 60 mg via ORAL
  Filled 2016-08-10: qty 1

## 2016-08-10 MED ORDER — AMOXICILLIN 250 MG PO CAPS
500.0000 mg | ORAL_CAPSULE | Freq: Once | ORAL | Status: AC
  Administered 2016-08-10: 500 mg via ORAL
  Filled 2016-08-10: qty 2

## 2016-08-10 MED ORDER — LORATADINE-PSEUDOEPHEDRINE ER 5-120 MG PO TB12: 1.0000 | ORAL_TABLET | Freq: Two times a day (BID) | ORAL | 0 refills | Status: DC

## 2016-08-10 NOTE — ED Provider Notes (Signed)
[facility]-EMER[facility]Y DEPT Provider Note   CSN: [id number] Arrival date & time: 08/10/16  1826     History   Chief Complaint Chief Complaint  Patient presents with  . Nasal Congestion    HPI Holly Pope is a 26 y.o. female.  Patient is a 27 year old female who presents to the emergency department with a complaint of congestion and sore throat.  The patient states that this problem started on Sunday, December 31. She has been having problems with congestion and increasing sore throat. She is able to get liquids down without problem. The patient denies high fever or chills. She has not noticed any diarrhea or vomiting. There's been no unusual rash noted. There is a child in the home who has been ill recently. Patient is taken some over-the-counter Tylenol but otherwise on no medications have been used up to this point.      Past Medical History:  Diagnosis Date  . Constipation 08/17/2015  . Diabetes mellitus without complication (HCC)   . H/O removal of neck cyst   . Hip dislocation, right (HCC) 05/06/2012  . Hypertension   . Morbidly obese [facility])     Patient Active Problem List   Diagnosis Date Noted  . History of delivery of macrosomal infant 05/10/2016  . Cesarean delivery delivered 03/15/2016  . Constipation 08/17/2015  . Diabetes (HCC)   . Hypertension   . Hip dislocation, right (HCC) 05/06/2012    Past Surgical History:  Procedure Laterality Date  . CESAREAN SECTION N/A 03/15/2016   Procedure: PRIMARY CESAREAN SECTION;  Surgeon: Lazaro Arms, MD;  Location: [facility] BIRTHING SUITES;  Service: Obstetrics;  Laterality: N/A;  . CYSTECTOMY  2002   rmoval from neck  . TOOTH EXTRACTION N/A 11/11/2013   Procedure: EXTRACT 3RD MOLARS PLUS ONE;  Surgeon: Georgia Lopes, DDS;  Location: [facility] OR;  Service: Oral Surgery;  Laterality: N/A;    OB History    Gravida Para Term Preterm AB Living   2 2 2     2    SAB TAB Ectopic Multiple Live Births         0 2       Home  Medications    Prior to Admission medications   Medication Sig Start Date End Date Taking? Authorizing Provider  amoxicillin (AMOXIL) 500 MG capsule Take 1 capsule (500 mg total) by mouth 3 (three) times daily. 08/10/16   Ivery Quale, PA-C  docusate sodium (COLACE) 100 MG capsule Take 1 capsule (100 mg total) by mouth 2 (two) times daily. Patient not taking: Reported on 05/10/2016 03/17/16   Garth Bigness, MD  ibuprofen (ADVIL,MOTRIN) 800 MG tablet Take 1 tablet (800 mg total) by mouth 3 (three) times daily. 08/10/16   Ivery Quale, PA-C  loratadine-pseudoephedrine (CLARITIN-D 12 HOUR) 5-120 MG tablet Take 1 tablet by mouth 2 (two) times daily. 08/10/16   Ivery Quale, PA-C  metFORMIN (GLUCOPHAGE) 1000 MG tablet Take 1 tablet (1,000 mg total) by mouth 2 (two) times daily with a meal. 03/17/16   Garth Bigness, MD  prenatal vitamin w/FE, FA (PRENATAL 1 + 1) 27-1 MG TABS tablet One tablet by mouth daily Patient not taking: Reported on 05/10/2016 10/19/15   Lazaro Arms, MD    Family History Family History  Problem Relation Age of Onset  . Heart disease Mother   . Hypertension Mother   . Diabetes Mother   . Heart disease Father   . Hypertension Father   . Hypertension Brother   .  Diabetes Brother   . Heart disease Brother   . Asthma Daughter     Social History Social History  Substance Use Topics  . Smoking status: Never Smoker  . Smokeless tobacco: Never Used  . Alcohol use No     Allergies   Patient has no known allergies.   Review of Systems Review of Systems  Constitutional: Positive for appetite change and chills. Negative for activity change and fever.       All ROS Neg except as noted in HPI  HENT: Positive for congestion, postnasal drip, sinus pain, sinus pressure and sore throat. Negative for nosebleeds.   Eyes: Negative for photophobia and discharge.  Respiratory: Positive for cough. Negative for shortness of breath and wheezing.   Cardiovascular: Negative  for chest pain and palpitations.  Gastrointestinal: Negative for abdominal pain and blood in stool.  Genitourinary: Negative for dysuria, frequency and hematuria.  Musculoskeletal: Positive for myalgias. Negative for arthralgias, back pain and neck pain.  Skin: Negative.   Neurological: Negative for dizziness, seizures and speech difficulty.  Psychiatric/Behavioral: Negative for confusion and hallucinations.     Physical Exam Updated Vital Signs BP 130/72   Pulse 103   Temp 97.7 F (36.5 C) (Oral)   Resp 18   Ht 5\' 3"  (1.6 m)   Wt 126.7 kg   LMP 07/25/2016   SpO2 99%   BMI 49.49 kg/m   Physical Exam  Constitutional: She is oriented to person, place, and time. She appears well-developed and well-nourished.  Non-toxic appearance.  HENT:  Head: Normocephalic.  Right Ear: Tympanic membrane and external ear normal.  Left Ear: Tympanic membrane and external ear normal.  Nasal congestion present.  There is some increased redness of the posterior pharynx. The airway is patent. No rash appreciated the oropharynx.  Eyes: EOM and lids are normal. Pupils are equal, round, and reactive to light.  Neck: Normal range of motion. Neck supple. Carotid bruit is not present.  Cardiovascular: Regular rhythm, normal heart sounds, intact distal pulses and normal pulses.  Tachycardia present.   Pulmonary/Chest: Breath sounds normal. No respiratory distress.  Abdominal: Soft. Bowel sounds are normal. There is no tenderness. There is no guarding.  Musculoskeletal: Normal range of motion.  Lymphadenopathy:       Head (right side): No submandibular adenopathy present.       Head (left side): No submandibular adenopathy present.    She has no cervical adenopathy.  Neurological: She is alert and oriented to person, place, and time. She has normal strength. No cranial nerve deficit or sensory deficit.  Skin: Skin is warm and dry. No rash noted.  Psychiatric: She has a normal mood and affect. Her speech  is normal.  Nursing note and vitals reviewed.    ED Treatments / Results  Labs (all labs ordered are listed, but only abnormal results are displayed) Labs Reviewed - No data to display  EKG  EKG Interpretation None       Radiology No results found.  Procedures Procedures (including critical care time)  Medications Ordered in ED Medications  amoxicillin (AMOXIL) capsule 500 mg (not administered)  pseudoephedrine (SUDAFED) tablet 60 mg (not administered)  ibuprofen (ADVIL,MOTRIN) tablet 800 mg (not administered)     Initial Impression / Assessment and Plan / ED Course  I have reviewed the triage vital signs and the nursing notes.  Pertinent labs & imaging results that were available during my care of the patient were reviewed by me and considered in my medical  decision making (see chart for details).  Clinical Course     **I have reviewed nursing notes, vital signs, and all appropriate lab and imaging results for this patient.*  Final Clinical Impressions(s) / ED Diagnoses  Vital signs reviewed. Pulse oximetry is 99% on room air. Examination suggest sinusitis and upper respiratory infection. Patient advised to increase fluids, wash hands frequently, and to rest is much as possible. Prescription for Amoxil, ibuprofen, and Claritin-D given to the patient. Patient is in agreement with this discharge plan. Patient will see the primary physician or return to the emergency department if not improving.    Final diagnoses:  Acute sinusitis, recurrence not specified, unspecified location  Upper respiratory tract infection, unspecified type    New Prescriptions New Prescriptions   AMOXICILLIN (AMOXIL) 500 MG CAPSULE    Take 1 capsule (500 mg total) by mouth 3 (three) times daily.   IBUPROFEN (ADVIL,MOTRIN) 800 MG TABLET    Take 1 tablet (800 mg total) by mouth 3 (three) times daily.   LORATADINE-PSEUDOEPHEDRINE (CLARITIN-D 12 HOUR) 5-120 MG TABLET    Take 1 tablet by mouth 2  (two) times daily.     Ivery Quale, PA-C 08/10/16 1946    Loren Racer, MD 08/12/16 1028

## 2016-08-10 NOTE — Discharge Instructions (Signed)
Please wash hands frequently. Increase fluids. Use Amoxil and ibuprofen 3 times daily. Use Claritin D every 12 hours to assist with congestion. Please see your primary physician for additional evaluation if not improving.

## 2016-08-10 NOTE — ED Triage Notes (Signed)
C/o congestion and sore throat since Sunday. Denies fever, chills, N/V/D/. Reports daughter had similar sx.

## 2016-08-28 ENCOUNTER — Emergency Department (HOSPITAL_COMMUNITY)
Admission: EM | Admit: 2016-08-28 | Discharge: 2016-08-28 | Disposition: A | Discharge status: Home / Self Care | Payer: Medicaid Other | Attending: Emergency Medicine | Admitting: Emergency Medicine

## 2016-08-28 ENCOUNTER — Encounter (HOSPITAL_COMMUNITY): Payer: Self-pay | Admitting: Emergency Medicine

## 2016-08-28 DIAGNOSIS — B9789 Other viral agents as the cause of diseases classified elsewhere: Secondary | ICD-10-CM

## 2016-08-28 DIAGNOSIS — E119 Type 2 diabetes mellitus without complications: Secondary | ICD-10-CM | POA: Diagnosis not present

## 2016-08-28 DIAGNOSIS — J069 Acute upper respiratory infection, unspecified: Secondary | ICD-10-CM | POA: Insufficient documentation

## 2016-08-28 DIAGNOSIS — Z79899 Other long term (current) drug therapy: Secondary | ICD-10-CM | POA: Diagnosis not present

## 2016-08-28 DIAGNOSIS — I10 Essential (primary) hypertension: Secondary | ICD-10-CM | POA: Diagnosis not present

## 2016-08-28 DIAGNOSIS — Z7984 Long term (current) use of oral hypoglycemic drugs: Secondary | ICD-10-CM | POA: Insufficient documentation

## 2016-08-28 DIAGNOSIS — R05 Cough: Secondary | ICD-10-CM | POA: Diagnosis present

## 2016-08-28 LAB — RAPID STREP SCREEN (MED CTR MEBANE ONLY): Streptococcus, Group A Screen (Direct): NEGATIVE

## 2016-08-28 MED ORDER — ACETAMINOPHEN 325 MG PO TABS
650.0000 mg | ORAL_TABLET | Freq: Once | ORAL | Status: AC
  Administered 2016-08-28: 650 mg via ORAL
  Filled 2016-08-28: qty 2

## 2016-08-28 MED ORDER — BENZONATATE 100 MG PO CAPS: 100.0000 mg | ORAL_CAPSULE | Freq: Three times a day (TID) | ORAL | 0 refills | Status: DC

## 2016-08-28 NOTE — ED Triage Notes (Signed)
PT c/o nasal congestion, sorethroat, body aches, and productive yellow sputum cough x4 days with no fevers.

## 2016-08-28 NOTE — Discharge Instructions (Signed)
Take ibuprofen and Tylenol for any body aches and fever. Rest. Drink plenty of fluids. Saltwater gargles for sore throat. Take Tessalon as prescribed as needed for cough. Your strep screen was negative today. If strep culture comes back positive we will give you a call. Otherwise your symptoms are consistent with a viral infection and will resolve on its own in a few days. Follow-up with family doctor or return if worsening symptoms.

## 2016-08-28 NOTE — ED Provider Notes (Signed)
[facility]-EMERG[facility]Y DEPT Provider Note   CSN: [id number] Arrival date & time: 08/28/16  1302   By signing my name below, I, Cynda Acres, attest that this documentation has been prepared under the direction and in the presence of Jaynie Crumble, PA-C Electronically Signed: Cynda Acres, Scribe. 08/28/16. 1:53 PM.  History   Chief Complaint Chief Complaint  Patient presents with  . Cough   HPI Comments: Holly Pope is a 26 y.o. female who presents to the Emergency Department complaining of a constant sore throat that began one week ago. Patient has associated productive cough with yellow sputum, ear ache, and a headache. Patient states her throat pain is worse at night. No modifying factors indicated. She denies any sick contacts, fever, vomiting, diarrhea, or rhinorrhea.    The history is provided by the patient. No language interpreter was used.    Past Medical History:  Diagnosis Date  . Constipation 08/17/2015  . Diabetes mellitus without complication (HCC)   . H/O removal of neck cyst   . Hip dislocation, right (HCC) 05/06/2012  . Hypertension   . Morbidly obese [facility])     Patient Active Problem List   Diagnosis Date Noted  . History of delivery of macrosomal infant 05/10/2016  . Cesarean delivery delivered 03/15/2016  . Constipation 08/17/2015  . Diabetes (HCC)   . Hypertension   . Hip dislocation, right (HCC) 05/06/2012    Past Surgical History:  Procedure Laterality Date  . CESAREAN SECTION N/A 03/15/2016   Procedure: PRIMARY CESAREAN SECTION;  Surgeon: Lazaro Arms, MD;  Location: [facility] BIRTHING SUITES;  Service: Obstetrics;  Laterality: N/A;  . CYSTECTOMY  2002   rmoval from neck  . TOOTH EXTRACTION N/A 11/11/2013   Procedure: EXTRACT 3RD MOLARS PLUS ONE;  Surgeon: Georgia Lopes, DDS;  Location: [facility] OR;  Service: Oral Surgery;  Laterality: N/A;    OB History    Gravida Para Term Preterm AB Living   2 2 2     2    SAB TAB Ectopic Multiple Live Births    0 2       Home Medications    Prior to Admission medications   Medication Sig Start Date End Date Taking? Authorizing Provider  amoxicillin (AMOXIL) 500 MG capsule Take 1 capsule (500 mg total) by mouth 3 (three) times daily. 08/10/16   Ivery Quale, PA-C  docusate sodium (COLACE) 100 MG capsule Take 1 capsule (100 mg total) by mouth 2 (two) times daily. Patient not taking: Reported on 05/10/2016 03/17/16   Garth Bigness, MD  ibuprofen (ADVIL,MOTRIN) 800 MG tablet Take 1 tablet (800 mg total) by mouth 3 (three) times daily. 08/10/16   Ivery Quale, PA-C  loratadine-pseudoephedrine (CLARITIN-D 12 HOUR) 5-120 MG tablet Take 1 tablet by mouth 2 (two) times daily. 08/10/16   Ivery Quale, PA-C  metFORMIN (GLUCOPHAGE) 1000 MG tablet Take 1 tablet (1,000 mg total) by mouth 2 (two) times daily with a meal. 03/17/16   Garth Bigness, MD  prenatal vitamin w/FE, FA (PRENATAL 1 + 1) 27-1 MG TABS tablet One tablet by mouth daily Patient not taking: Reported on 05/10/2016 10/19/15   Lazaro Arms, MD    Family History Family History  Problem Relation Age of Onset  . Heart disease Mother   . Hypertension Mother   . Diabetes Mother   . Heart disease Father   . Hypertension Father   . Hypertension Brother   . Diabetes Brother   . Heart disease Brother   .  Asthma Daughter     Social History Social History  Substance Use Topics  . Smoking status: Never Smoker  . Smokeless tobacco: Never Used  . Alcohol use No     Allergies   Patient has no known allergies.   Review of Systems Review of Systems  Constitutional: Negative for fever.  HENT: Positive for ear pain, sore throat and trouble swallowing. Negative for rhinorrhea.   Gastrointestinal: Negative for diarrhea and vomiting.  Neurological: Positive for headaches.     Physical Exam Updated Vital Signs BP 102/58 (BP Location: Left Arm)   Pulse 93   Temp 98.2 F (36.8 C) (Oral)   Resp 18   Ht 5\' 3"  (1.6 m)   Wt 289 lb 5 oz  (131.2 kg)   LMP 08/25/2016   SpO2 96%   BMI 51.25 kg/m   Physical Exam  Constitutional: She is oriented to person, place, and time. She appears well-developed and well-nourished. No distress.  HENT:  Head: Normocephalic and atraumatic.  Right Ear: Tympanic membrane, external ear and ear canal normal.  Left Ear: Tympanic membrane, external ear and ear canal normal.  Nose: Mucosal edema and rhinorrhea present.  Mouth/Throat: Uvula is midline and mucous membranes are normal. Posterior oropharyngeal erythema present. No oropharyngeal exudate, posterior oropharyngeal edema or tonsillar abscesses.  Eyes: Conjunctivae are normal.  Neck: Neck supple.  Cardiovascular: Normal rate, regular rhythm, normal heart sounds and intact distal pulses.   Pulmonary/Chest: Effort normal and breath sounds normal. No respiratory distress. She has no wheezes. She has no rales.  Abdominal: She exhibits no distension.  Musculoskeletal: Normal range of motion.  Neurological: She is alert and oriented to person, place, and time.  Skin: Skin is warm and dry.  Psychiatric: She has a normal mood and affect.  Nursing note and vitals reviewed.    ED Treatments / Results  DIAGNOSTIC STUDIES: Oxygen Saturation is 96% on RA, normal by my interpretation.    COORDINATION OF CARE: 1:53 PM Discussed treatment plan with pt at bedside and pt agreed to plan.  Labs (all labs ordered are listed, but only abnormal results are displayed) Labs Reviewed - No data to display  EKG  EKG Interpretation None       Radiology No results found.  Procedures Procedures (including critical care time)  Medications Ordered in ED Medications - No data to display   Initial Impression / Assessment and Plan / ED Course  I have reviewed the triage vital signs and the nursing notes.  Pertinent labs & imaging results that were available during my care of the patient were reviewed by me and considered in my medical decision  making (see chart for details).    Patient with flulike symptoms, specifically body aches, sore throat, mild cough and congestion, diarrhea. She is in no acute distress, vital signs are normal. Exam unremarkable other than some erythema to the oropharynx. Lungs are clear. Oxygen saturation is 100% on room air. I do not think clinically patient has pneumonia with these vital signs and clear lung exam. Rapid strep obtained and is negative. Most likely viral upper respiratory tract infection with no evidence of sepsis or meningismus. Will treat this symptomatically. Will discharge home with outpatient close follow-up. Return precautions discussed.  Vitals:   08/28/16 1320 08/28/16 1322 08/28/16 1323 08/28/16 1429  BP: 102/58   108/65  Pulse: 93   91  Resp: 18   20  Temp: 98.2 F (36.8 C)   98.7 F (37.1 C)  TempSrc:  Oral   Oral  SpO2: 96%   100%  Weight:  126.6 kg 131.2 kg   Height:  5\' 3"  (1.6 m)       Final Clinical Impressions(s) / ED Diagnoses   Final diagnoses:  Viral URI with cough    New Prescriptions Discharge Medication List as of 08/28/2016  2:22 PM    START taking these medications   Details  benzonatate (TESSALON) 100 MG capsule Take 1 capsule (100 mg total) by mouth every 8 (eight) hours., Starting Sun 08/28/2016, Print       I personally performed the services described in this documentation, which was scribed in my presence. The recorded information has been reviewed and is accurate.    Jaynie Crumble, PA-C 08/28/16 1448    Azalia Bilis, MD 08/28/16 [phone]

## 2016-08-30 ENCOUNTER — Other Ambulatory Visit: Payer: Medicaid Other | Admitting: Adult Health

## 2016-08-31 LAB — CULTURE, GROUP A STREP (THRC)

## 2016-09-03 ENCOUNTER — Encounter (HOSPITAL_COMMUNITY): Payer: Self-pay | Admitting: Emergency Medicine

## 2016-09-03 ENCOUNTER — Emergency Department (HOSPITAL_COMMUNITY)
Admission: EM | Admit: 2016-09-03 | Discharge: 2016-09-03 | Disposition: A | Discharge status: Home / Self Care | Payer: Medicaid Other | Attending: Emergency Medicine | Admitting: Emergency Medicine

## 2016-09-03 DIAGNOSIS — Y929 Unspecified place or not applicable: Secondary | ICD-10-CM | POA: Insufficient documentation

## 2016-09-03 DIAGNOSIS — S300XXA Contusion of lower back and pelvis, initial encounter: Secondary | ICD-10-CM | POA: Insufficient documentation

## 2016-09-03 DIAGNOSIS — E119 Type 2 diabetes mellitus without complications: Secondary | ICD-10-CM | POA: Insufficient documentation

## 2016-09-03 DIAGNOSIS — Z7984 Long term (current) use of oral hypoglycemic drugs: Secondary | ICD-10-CM | POA: Diagnosis not present

## 2016-09-03 DIAGNOSIS — M62830 Muscle spasm of back: Secondary | ICD-10-CM

## 2016-09-03 DIAGNOSIS — S20221A Contusion of right back wall of thorax, initial encounter: Secondary | ICD-10-CM

## 2016-09-03 DIAGNOSIS — Y999 Unspecified external cause status: Secondary | ICD-10-CM | POA: Insufficient documentation

## 2016-09-03 DIAGNOSIS — W500XXA Accidental hit or strike by another person, initial encounter: Secondary | ICD-10-CM | POA: Insufficient documentation

## 2016-09-03 DIAGNOSIS — I10 Essential (primary) hypertension: Secondary | ICD-10-CM | POA: Diagnosis not present

## 2016-09-03 DIAGNOSIS — Y9389 Activity, other specified: Secondary | ICD-10-CM | POA: Diagnosis not present

## 2016-09-03 DIAGNOSIS — S3992XA Unspecified injury of lower back, initial encounter: Secondary | ICD-10-CM | POA: Diagnosis present

## 2016-09-03 MED ORDER — CYCLOBENZAPRINE HCL 10 MG PO TABS
10.0000 mg | ORAL_TABLET | Freq: Three times a day (TID) | ORAL | 0 refills | Status: DC
Start: 2016-09-03 — End: 2016-09-07

## 2016-09-03 MED ORDER — DICLOFENAC SODIUM 75 MG PO TBEC: 75.0000 mg | DELAYED_RELEASE_TABLET | Freq: Two times a day (BID) | ORAL | 0 refills | Status: DC

## 2016-09-03 NOTE — ED Notes (Signed)
Patient with no complaints at this time. Respirations even and unlabored. Skin warm/dry. Discharge instructions reviewed with patient at this time. Patient given opportunity to voice concerns/ask questions. Patient discharged at this time and left Emergency Department with steady gait.   

## 2016-09-03 NOTE — ED Provider Notes (Addendum)
[facility] DEPT Provider Note   CSN: [id number] Arrival date & time: 09/03/16  1321     History   Chief Complaint Chief Complaint  Patient presents with  . Back Pain    states daughter kicked back really hard    HPI Holly Pope is a 26 y.o. female.  Patient is a 26 year old female who presents to the emergency department with complaint of right back/flank area pain.  The patient states that her young daughter kicked her during play doing hard in the back/flank area. The patient states that she tried conservative measures, including Tylenol, and rest. She states the pain can progressively worse. She has pain when she changes positions, and pain when she laughs. She wanted to be evaluated for this issue. His been no blood in the urine. The patient has no difficulty with breathing. Patient is not on any anticoagulation medications. There's been no recent operations or procedures involving the back or flank area.   The history is provided by the patient.  Back Pain   Pertinent negatives include no chest pain, no abdominal pain and no dysuria.    Past Medical History:  Diagnosis Date  . Constipation 08/17/2015  . Diabetes mellitus without complication (HCC)   . H/O removal of neck cyst   . Hip dislocation, right (HCC) 05/06/2012  . Hypertension   . Morbidly obese [facility])     Patient Active Problem List   Diagnosis Date Noted  . History of delivery of macrosomal infant 05/10/2016  . Cesarean delivery delivered 03/15/2016  . Constipation 08/17/2015  . Diabetes (HCC)   . Hypertension   . Hip dislocation, right (HCC) 05/06/2012    Past Surgical History:  Procedure Laterality Date  . CESAREAN SECTION N/A 03/15/2016   Procedure: PRIMARY CESAREAN SECTION;  Surgeon: Lazaro Arms, MD;  Location: [facility];  Service: Obstetrics;  Laterality: N/A;  . CYSTECTOMY  2002   rmoval from neck  . TOOTH EXTRACTION N/A 11/11/2013   Procedure: EXTRACT 3RD MOLARS PLUS ONE;   Surgeon: Georgia Lopes, DDS;  Location: [facility] OR;  Service: Oral Surgery;  Laterality: N/A;    OB History    Gravida Para Term Preterm AB Living   2 2 2     2    SAB TAB Ectopic Multiple Live Births         0 2       Home Medications    Prior to Admission medications   Medication Sig Start Date End Date Taking? Authorizing Provider  benazepril (LOTENSIN) 10 MG tablet Take 10 mg by mouth daily.   Yes Historical Provider, MD  metFORMIN (GLUCOPHAGE) 1000 MG tablet Take 1 tablet (1,000 mg total) by mouth 2 (two) times daily with a meal. 03/17/16  Yes Garth Bigness, MD  amoxicillin (AMOXIL) 500 MG capsule Take 1 capsule (500 mg total) by mouth 3 (three) times daily. Patient not taking: Reported on 08/28/2016 08/10/16   Ivery Quale, PA-C  benzonatate (TESSALON) 100 MG capsule Take 1 capsule (100 mg total) by mouth every 8 (eight) hours. Patient not taking: Reported on 09/03/2016 08/28/16   Jaynie Crumble, PA-C    Family History Family History  Problem Relation Age of Onset  . Heart disease Mother   . Hypertension Mother   . Diabetes Mother   . Heart disease Father   . Hypertension Father   . Hypertension Brother   . Diabetes Brother   . Heart disease Brother   . Asthma Daughter  Social History Social History  Substance Use Topics  . Smoking status: Never Smoker  . Smokeless tobacco: Never Used  . Alcohol use No     Allergies   Patient has no known allergies.   Review of Systems Review of Systems  Constitutional: Negative for activity change.       All ROS Neg except as noted in HPI  HENT: Negative for nosebleeds.   Eyes: Negative for photophobia and discharge.  Respiratory: Negative for cough, shortness of breath and wheezing.   Cardiovascular: Negative for chest pain and palpitations.  Gastrointestinal: Negative for abdominal pain and blood in stool.  Genitourinary: Negative for dysuria, frequency and hematuria.  Musculoskeletal: Positive for back pain.  Negative for arthralgias and neck pain.  Skin: Negative.   Neurological: Negative for dizziness, seizures and speech difficulty.  Psychiatric/Behavioral: Negative for confusion and hallucinations.     Physical Exam Updated Vital Signs BP 126/90 (BP Location: Right Arm)   Pulse 102   Temp 98.2 F (36.8 C) (Oral)   Resp 16   Ht 5\' 3"  (1.6 m)   Wt 131.1 kg   LMP 08/25/2016   SpO2 99%   BMI 51.19 kg/m   Physical Exam  Constitutional: She is oriented to person, place, and time. She appears well-developed and well-nourished.  Non-toxic appearance.  HENT:  Head: Normocephalic.  Right Ear: Tympanic membrane and external ear normal.  Left Ear: Tympanic membrane and external ear normal.  Eyes: EOM and lids are normal. Pupils are equal, round, and reactive to light.  Neck: Normal range of motion. Neck supple. Carotid bruit is not present.  Cardiovascular: Normal rate, regular rhythm, normal heart sounds, intact distal pulses and normal pulses.   Pulmonary/Chest: Breath sounds normal. No respiratory distress.  Abdominal: Soft. Bowel sounds are normal. There is no tenderness. There is no guarding.  Musculoskeletal:       Lumbar back: She exhibits decreased range of motion and spasm.       Back:  Lymphadenopathy:       Head (right side): No submandibular adenopathy present.       Head (left side): No submandibular adenopathy present.    She has no cervical adenopathy.  Neurological: She is alert and oriented to person, place, and time. She has normal strength. No cranial nerve deficit or sensory deficit.  Skin: Skin is warm and dry.  Psychiatric: She has a normal mood and affect. Her speech is normal.  Nursing note and vitals reviewed.    ED Treatments / Results  Labs (all labs ordered are listed, but only abnormal results are displayed) Labs Reviewed - No data to display  EKG  EKG Interpretation None       Radiology No results found.  Procedures Procedures (including  critical care time)  Medications Ordered in ED Medications - No data to display   Initial Impression / Assessment and Plan / ED Course  I have reviewed the triage vital signs and the nursing notes.  Pertinent labs & imaging results that were available during my care of the patient were reviewed by me and considered in my medical decision making (see chart for details).     *I have reviewed nursing notes, vital signs, and all appropriate lab and imaging results for this patient.**  Final Clinical Impressions(s) / ED Diagnoses  Vital signs reviewed. The examination favors contusion to the lower back and muscle strain. Pulse oximetry is 99%.  We will discuss with patient the importance of a heating pad  to the lower back area. No dysuria. The patient will use diclofenac and Flexeril. Patient will follow-up with Dr. Olena LeatherwoodHasanaj for additional evaluation if not improving.    Final diagnoses:  Contusion of right side of back, initial encounter  Muscle spasm of back    New Prescriptions New Prescriptions   No medications on file     Ivery QualeHobson Revia Nghiem, PA-C 09/03/16 1641    Samuel JesterKathleen McManus, DO 09/07/16 1344    Ivery QualeHobson Maicy Filip, PA-C 09/14/16 1152    Samuel JesterKathleen McManus, DO 09/17/16 2135

## 2016-09-03 NOTE — Discharge Instructions (Signed)
Please use heating pad to your back. Use Flexeril 3 times daily for spasm pain. Use diclofenac 2 times daily with food. Please see Dr. Romeo AppleHarrison for orthopedic evaluation of your back if not improving.

## 2016-09-03 NOTE — ED Triage Notes (Signed)
Back flank pain since this am when daughter kicked her really hard in play

## 2016-09-07 ENCOUNTER — Ambulatory Visit (INDEPENDENT_AMBULATORY_CARE_PROVIDER_SITE_OTHER): Payer: Medicaid Other | Admitting: Adult Health

## 2016-09-07 ENCOUNTER — Encounter: Payer: Self-pay | Admitting: Adult Health

## 2016-09-07 ENCOUNTER — Other Ambulatory Visit (HOSPITAL_COMMUNITY)
Admission: RE | Admit: 2016-09-07 | Discharge: 2016-09-07 | Disposition: A | Discharge status: Home / Self Care | Payer: Medicaid Other | Source: Ambulatory Visit | Attending: Obstetrics & Gynecology | Admitting: Obstetrics & Gynecology

## 2016-09-07 VITALS — BP 122/70 | HR 80 | Ht 63.25 in | Wt 280.4 lb

## 2016-09-07 DIAGNOSIS — Z0000 Encounter for general adult medical examination without abnormal findings: Secondary | ICD-10-CM

## 2016-09-07 DIAGNOSIS — Z01419 Encounter for gynecological examination (general) (routine) without abnormal findings: Secondary | ICD-10-CM

## 2016-09-07 DIAGNOSIS — Z124 Encounter for screening for malignant neoplasm of cervix: Secondary | ICD-10-CM

## 2016-09-07 DIAGNOSIS — F32A Depression, unspecified: Secondary | ICD-10-CM

## 2016-09-07 DIAGNOSIS — F329 Major depressive disorder, single episode, unspecified: Secondary | ICD-10-CM

## 2016-09-07 MED ORDER — ESCITALOPRAM OXALATE 10 MG PO TABS: 10.0000 mg | ORAL_TABLET | Freq: Every day | ORAL | 6 refills | Status: DC

## 2016-09-07 NOTE — Patient Instructions (Signed)
Follow up in 6 weeks Physical in 1 year 

## 2016-09-07 NOTE — Progress Notes (Signed)
Patient ID: Holly Pope, female   DOB: 1990/12/28, 26 y.o.   MRN: [id number] History of Present Illness: Holly Pope is a 26 year old white female in for a well woman gyn exam and pap.Her 154 month old has the flu she says.  [place], who manages her diabetes.   Current Medications, Allergies, Past Medical History, Past Surgical History, Family History and Social History were reviewed in [facility] electronic medical record.     Review of Systems:  Patient denies any headaches, hearing loss, fatigue, blurred vision, shortness of breath, chest pain, abdominal pain, problems with bowel movements, urination, or intercourse. No joint pain or mood swings. She declines birth control at this time.   Physical Exam:BP 122/70 (BP Location: Left Arm, Patient Position: Sitting, Cuff Size: Large)   Pulse 80   Ht 5' 3.25" (1.607 m)   Wt 280 lb 6.4 oz (127.2 kg)   LMP 08/25/2016   Breastfeeding? No   BMI 49.28 kg/m  General:  Well developed, well nourished, no acute distress Skin:  Warm and dry Neck:  Midline trachea, normal thyroid, good ROM, no lymphadenopathy Lungs; Clear to auscultation bilaterally Breast:  No dominant palpable mass, retraction, or nipple discharge Cardiovascular: Regular rate and rhythm Abdomen:  Soft, non tender, no hepatosplenomegaly,well healed pfannenstiel incision Pelvic:  External genitalia is normal in appearance, no lesions.  The vagina is normal in appearance. Urethra has no lesions or masses. The cervix is bulbous.Pap with GC/CHL performed.  Uterus is felt to be normal size, shape, and contour.  No adnexal masses or tenderness noted.Bladder is non tender, no masses felt. Extremities/musculoskeletal:  No swelling or varicosities noted, no clubbing or cyanosis Psych:  No mood changes, alert and cooperative,seems happy PHQ 9 score 3, she says she is teary at times, and may be depressed, denies any suicidal or homicidal ideations, discussed meds and  counseling and she says will try meds.  Impression:  1. Encounter for routine gynecological examination with Papanicolaou smear of cervix   2. Routine cervical smear   3. Depression, unspecified depression type      Plan: Rx lexapro 10 mg #30 take 1 daily with 6 refills Follow up in 6 weeks Physical in 1 year Pap in 3 if normal  Labs with PCP

## 2016-09-13 LAB — CYTOLOGY - PAP: Diagnosis: NEGATIVE

## 2016-09-22 ENCOUNTER — Encounter (HOSPITAL_COMMUNITY): Payer: Self-pay

## 2016-09-22 ENCOUNTER — Emergency Department (HOSPITAL_COMMUNITY)
Admission: EM | Admit: 2016-09-22 | Discharge: 2016-09-23 | Disposition: A | Discharge status: Home / Self Care | Payer: Medicaid Other | Attending: Emergency Medicine | Admitting: Emergency Medicine

## 2016-09-22 DIAGNOSIS — J029 Acute pharyngitis, unspecified: Secondary | ICD-10-CM | POA: Insufficient documentation

## 2016-09-22 DIAGNOSIS — Z7984 Long term (current) use of oral hypoglycemic drugs: Secondary | ICD-10-CM | POA: Diagnosis not present

## 2016-09-22 DIAGNOSIS — I10 Essential (primary) hypertension: Secondary | ICD-10-CM | POA: Diagnosis not present

## 2016-09-22 DIAGNOSIS — Z79899 Other long term (current) drug therapy: Secondary | ICD-10-CM | POA: Diagnosis not present

## 2016-09-22 DIAGNOSIS — E119 Type 2 diabetes mellitus without complications: Secondary | ICD-10-CM | POA: Diagnosis not present

## 2016-09-22 LAB — RAPID STREP SCREEN (MED CTR MEBANE ONLY): Streptococcus, Group A Screen (Direct): NEGATIVE

## 2016-09-22 NOTE — ED Triage Notes (Signed)
Patient states that her throat has been hurting since yesterday, her nose has been stopped up.  Denies fever.

## 2016-09-23 NOTE — ED Provider Notes (Signed)
[facility] DEPT Provider Note   CSN: [id number] Arrival date & time: 09/22/16  2150 By signing my name below, I, Holly Pope, attest that this documentation has been prepared under the direction and in the presence of Holly Albe, MD . Electronically Signed: Levon Pope, Scribe. 09/23/2016. 1:06 AM.  Time seen 12:56 AM  History   Chief Complaint Chief Complaint  Patient presents with  . Sore Throat    HPI Holly Pope is a 26 y.o. female with a h/o DM and HTN who presents to the Emergency Department complaining of intermittent, moderate sore throat onset two days ago. Pain is exacerbated by coughing, talking and "eating hard foods". No OTC medication taken PTA.  She notes associated mild cough and stuffy nose. Pt denies any fever or difficulty swallowing. She denies any recent sick contact. She denies any alcohol or tobacco use. Pt is unemployed. Pt has no other complaints or symptoms at this time.   The history is provided by the patient. No language interpreter was used.   PCP Holly Deiters, MD   Past Medical History:  Diagnosis Date  . Constipation 08/17/2015  . Diabetes mellitus without complication (HCC)   . H/O removal of neck cyst   . Hip dislocation, right (HCC) 05/06/2012  . Hypertension   . Morbidly obese [facility])     Patient Active Problem List   Diagnosis Date Noted  . History of delivery of macrosomal infant 05/10/2016  . Cesarean delivery delivered 03/15/2016  . Constipation 08/17/2015  . Diabetes (HCC)   . Hypertension   . Hip dislocation, right (HCC) 05/06/2012    Past Surgical History:  Procedure Laterality Date  . CESAREAN SECTION N/A 03/15/2016   Procedure: PRIMARY CESAREAN SECTION;  Surgeon: Lazaro Arms, MD;  Location: [facility] BIRTHING SUITES;  Service: Obstetrics;  Laterality: N/A;  . CYSTECTOMY  2002   rmoval from neck  . TOOTH EXTRACTION N/A 11/11/2013   Procedure: EXTRACT 3RD MOLARS PLUS ONE;  Surgeon: Holly Pope, DDS;  Location: [facility] OR;   Service: Oral Surgery;  Laterality: N/A;    OB History    Gravida Para Term Preterm AB Living   2 2 2     2    SAB TAB Ectopic Multiple Live Births         0 2       Home Medications    Prior to Admission medications   Medication Sig Start Date End Date Taking? Authorizing Provider  benazepril (LOTENSIN) 10 MG tablet Take 10 mg by mouth daily.    Historical Provider, MD  escitalopram (LEXAPRO) 10 MG tablet Take 1 tablet (10 mg total) by mouth daily. 09/07/16   Holly Potter, NP  metFORMIN (GLUCOPHAGE) 1000 MG tablet Take 1 tablet (1,000 mg total) by mouth 2 (two) times daily with a meal. 03/17/16   Holly Bigness, MD    Family History Family History  Problem Relation Age of Onset  . Heart disease Mother   . Hypertension Mother   . Diabetes Mother   . Heart disease Father   . Hypertension Father   . Hypertension Brother   . Diabetes Brother   . Heart disease Brother   . Asthma Daughter     Social History Social History  Substance Use Topics  . Smoking status: Never Smoker  . Smokeless tobacco: Never Used  . Alcohol use No  unemployed   Allergies   Patient has no known allergies.   Review of Systems Review of Systems  All other systems reviewed and are negative.    Physical Exam Updated Vital Signs BP 127/82   Pulse 101   Temp 98.8 F (37.1 C)   Resp 18   Ht 5\' 3"  (1.6 m)   Wt 289 lb (131.1 kg)   LMP 08/25/2016 (Exact Date)   SpO2 97%   Breastfeeding? No   BMI 51.19 kg/m   Vital signs normal except tachycardia   Physical Exam  Constitutional: She is oriented to person, place, and time. She appears well-developed and well-nourished.  Non-toxic appearance. She does not appear ill. No distress.  HENT:  Head: Normocephalic and atraumatic.  Right Ear: External ear normal.  Left Ear: External ear normal.  Nose: Nose normal. No mucosal edema or rhinorrhea.  Mouth/Throat: Oropharynx is clear and moist and mucous membranes are normal. No  dental abscesses or uvula swelling. No oropharyngeal exudate, posterior oropharyngeal edema, posterior oropharyngeal erythema or tonsillar abscesses.  Voice was normal. No redness, no swelling, uvula midline  Eyes: Conjunctivae and EOM are normal. Pupils are equal, round, and reactive to light.  Neck: Normal range of motion and full passive range of motion without pain. Neck supple. No thyromegaly present.  Cardiovascular: Normal rate, regular rhythm and normal heart sounds.  Exam reveals no gallop and no friction rub.   No murmur heard. Pulmonary/Chest: Effort normal and breath sounds normal. No respiratory distress. She has no wheezes. She has no rhonchi. She has no rales. She exhibits no tenderness and no crepitus.  Abdominal: Soft. Normal appearance and bowel sounds are normal. She exhibits no distension. There is no tenderness. There is no rebound and no guarding.  Musculoskeletal: Normal range of motion. She exhibits no edema or tenderness.  Lymphadenopathy:    She has no cervical adenopathy.  Neurological: She is alert and oriented to person, place, and time. She has normal strength. No cranial nerve deficit.  Skin: Skin is warm, dry and intact. No rash noted. No erythema. No pallor.  Psychiatric: She has a normal mood and affect. Her speech is normal and behavior is normal. Her mood appears not anxious.  Nursing note and vitals reviewed.  ED Treatments / Results  DIAGNOSTIC STUDIES:  Oxygen Saturation is 97% on RA, normal by my interpretation.    Labs (all labs ordered are listed, but only abnormal results are displayed) Results for orders placed or performed during the hospital encounter of 09/22/16  Rapid strep screen  Result Value Ref Range   Streptococcus, Group A Screen (Direct) NEGATIVE NEGATIVE   Laboratory interpretation all normal    Procedures Procedures (including critical care time)  Medications Ordered in ED Medications - No data to display   Initial  Impression / Assessment and Plan / ED Course  I have reviewed the triage vital signs and the nursing notes.  Pertinent labs & imaging results that were available during my care of the patient were reviewed by me and considered in my medical decision making (see chart for details).   COORDINATION OF CARE:  1:02 AM Discussed treatment plan with pt at bedside and pt agreed to plan.  We discussed she most likely has a viral infection. She can drink plenty of fluids, gargle salt water, use cough drops. She should be rechecked if she gets a high fever has difficulty swallowing or breathing.   Final Clinical Impressions(s) / ED Diagnoses   Final diagnoses:  Sore throat    New Prescriptions OTC ibuprofen/acetaminophen for pain as needed   Plan discharge  Providencia Hottenstein  Lynelle DoctorKnapp, MD, FACEP   I personally performed the services described in this documentation, which was scribed in my presence. The recorded information has been reviewed and considered.  Holly AlbeIva Grant Swager, MD, Concha PyoFACEP    Otisha Spickler, MD 09/23/16 [phone]

## 2016-09-23 NOTE — Discharge Instructions (Signed)
Drink plenty of fluids. Take ibuprofen 600 mg + acetaminophen 1000 mg every 6 hrs for pain. You can gargle with salt water and take sore throat lozenges. Recheck if you get a high fever, are unable to swallow or have difficulty breathing.

## 2016-09-25 LAB — CULTURE, GROUP A STREP (THRC)

## 2016-10-19 ENCOUNTER — Encounter: Payer: Self-pay | Admitting: Adult Health

## 2016-10-19 ENCOUNTER — Ambulatory Visit (INDEPENDENT_AMBULATORY_CARE_PROVIDER_SITE_OTHER): Payer: Medicaid Other | Admitting: Adult Health

## 2016-10-19 VITALS — BP 108/66 | HR 80 | Ht 63.0 in | Wt 292.0 lb

## 2016-10-19 DIAGNOSIS — F32A Depression, unspecified: Secondary | ICD-10-CM

## 2016-10-19 DIAGNOSIS — F320 Major depressive disorder, single episode, mild: Secondary | ICD-10-CM

## 2016-10-19 DIAGNOSIS — F329 Major depressive disorder, single episode, unspecified: Secondary | ICD-10-CM

## 2016-10-19 MED ORDER — ESCITALOPRAM OXALATE 20 MG PO TABS: 20.0000 mg | ORAL_TABLET | Freq: Every day | ORAL | 6 refills | Status: DC

## 2016-10-19 NOTE — Progress Notes (Signed)
Subjective:     Patient ID: Holly Pope, female   DOB: 1991/05/21, 26 y.o.   MRN: [id number]  HPI Holly Pope is a 26 year old white female, back in follow up of starting lexapro after 09/07/16 visit for well woman gyn exam and pap.She says she is a little better, still depressed.   Review of Systems +depression Reviewed past medical,surgical, social and family history. Reviewed medications and allergies.     Objective:   Physical Exam BP 108/66 (BP Location: Right Arm, Patient Position: Sitting, Cuff Size: Large)   Pulse 80   Ht 5\' 3"  (1.6 m)   Wt 292 lb (132.5 kg)   LMP 09/25/2016   BMI 51.73 kg/m  Skin warm and dry.  Lungs: clear to ausculation bilaterally. Cardiovascular: regular rate and rhythm.   PHQ 9 score 14, she denies being suicidal and feels better, but agrees lexapro needs increasing.  Assessment:      Depression  Plan:     Will increase lexapro to 20 mg. Rx lexapro 20 mg #30 take 1 daily with 6 refills Follow up in 6 weeks

## 2016-11-30 ENCOUNTER — Encounter: Payer: Self-pay | Admitting: Adult Health

## 2016-11-30 ENCOUNTER — Ambulatory Visit (INDEPENDENT_AMBULATORY_CARE_PROVIDER_SITE_OTHER): Payer: Medicaid Other | Admitting: Adult Health

## 2016-11-30 VITALS — BP 108/60 | HR 118 | Ht 64.0 in | Wt 285.6 lb

## 2016-11-30 DIAGNOSIS — N926 Irregular menstruation, unspecified: Secondary | ICD-10-CM | POA: Diagnosis not present

## 2016-11-30 DIAGNOSIS — F32A Depression, unspecified: Secondary | ICD-10-CM

## 2016-11-30 DIAGNOSIS — Z3202 Encounter for pregnancy test, result negative: Secondary | ICD-10-CM

## 2016-11-30 DIAGNOSIS — F329 Major depressive disorder, single episode, unspecified: Secondary | ICD-10-CM

## 2016-11-30 LAB — POCT URINE PREGNANCY: Preg Test, Ur: NEGATIVE

## 2016-11-30 NOTE — Progress Notes (Signed)
Subjective:     Patient ID: Holly Pope, female   DOB: 1991/02/21, 26 y.o.   MRN: [id number]  HPI Holly Pope is a 26 year old white female back in follow up of increasing lexapro to 20 mg and she is much better she says.Has irregular periods.  Review of Systems Irregular periods Not as depressed,feels much better  Reviewed past medical,surgical, social and family history. Reviewed medications and allergies.     Objective:   Physical Exam BP 108/60 (BP Location: Right Arm, Patient Position: Sitting, Cuff Size: Normal)   Pulse (!) 118   Ht  (1.626 m)   Wt 285 lb 9.6 oz (129.5 kg)   LMP 09/26/2016 (Approximate)   Breastfeeding? No   BMI 49.02 kg/m UPT negative, Skin warm and dry. Lungs: clear to ausculation bilaterally. Cardiovascular: regular rate and rhythm.   PHQ 2 score 0, PHQ 9 score was 14 on 10/19/16, and lexapro was increased to 20 mg at that visit. She declines OCs, and is aware not to miss more than 3 periods in a row.   Assessment:     1. Depression, unspecified depression type   2. Irregular periods   3. Pregnancy examination or test, negative result       Plan:     Continue lexapro 20 mg  Keep period calendar Follow up in 3 months or sooner if needed

## 2016-12-22 ENCOUNTER — Encounter (HOSPITAL_COMMUNITY): Payer: Self-pay

## 2016-12-22 ENCOUNTER — Emergency Department (HOSPITAL_COMMUNITY): Payer: Medicaid Other

## 2016-12-22 ENCOUNTER — Emergency Department (HOSPITAL_COMMUNITY)
Admission: EM | Admit: 2016-12-22 | Discharge: 2016-12-22 | Disposition: A | Discharge status: Home / Self Care | Payer: Medicaid Other | Attending: Emergency Medicine | Admitting: Emergency Medicine

## 2016-12-22 DIAGNOSIS — J40 Bronchitis, not specified as acute or chronic: Secondary | ICD-10-CM | POA: Insufficient documentation

## 2016-12-22 DIAGNOSIS — I10 Essential (primary) hypertension: Secondary | ICD-10-CM | POA: Insufficient documentation

## 2016-12-22 DIAGNOSIS — Z7984 Long term (current) use of oral hypoglycemic drugs: Secondary | ICD-10-CM | POA: Diagnosis not present

## 2016-12-22 DIAGNOSIS — E119 Type 2 diabetes mellitus without complications: Secondary | ICD-10-CM | POA: Diagnosis not present

## 2016-12-22 DIAGNOSIS — H9201 Otalgia, right ear: Secondary | ICD-10-CM | POA: Insufficient documentation

## 2016-12-22 DIAGNOSIS — Z79899 Other long term (current) drug therapy: Secondary | ICD-10-CM | POA: Insufficient documentation

## 2016-12-22 DIAGNOSIS — R05 Cough: Secondary | ICD-10-CM | POA: Diagnosis present

## 2016-12-22 MED ORDER — AMOXICILLIN 500 MG PO CAPS: 500.0000 mg | ORAL_CAPSULE | Freq: Three times a day (TID) | ORAL | 0 refills | Status: DC

## 2016-12-22 NOTE — ED Triage Notes (Addendum)
Non-productive cough since Friday with right ear pain. States rib pain when coughing.

## 2016-12-22 NOTE — ED Provider Notes (Signed)
[facility]-EMERGENCY DEPT Provider Note   CSN: [id number] Arrival date & time: 12/22/16  2011     History   Chief Complaint Chief Complaint  Patient presents with  . Cough    HPI Holly Pope is a 26 y.o. female.  The history is provided by the patient. No language interpreter was used.  Cough  This is a new problem. The problem occurs constantly. The problem has been gradually worsening. The cough is non-productive. There has been no fever. Associated symptoms include chest pain. She has tried nothing for the symptoms. The treatment provided no relief. She is not a smoker.   Pt complains of a cough and congestion Past Medical History:  Diagnosis Date  . Constipation 08/17/2015  . Diabetes mellitus without complication (HCC)   . H/O removal of neck cyst   . Hip dislocation, right (HCC) 05/06/2012  . Hypertension   . Morbidly obese [facility])     Patient Active Problem List   Diagnosis Date Noted  . History of delivery of macrosomal infant 05/10/2016  . Cesarean delivery delivered 03/15/2016  . Constipation 08/17/2015  . Diabetes (HCC)   . Hypertension   . Hip dislocation, right (HCC) 05/06/2012    Past Surgical History:  Procedure Laterality Date  . CESAREAN SECTION N/A 03/15/2016   Procedure: PRIMARY CESAREAN SECTION;  Surgeon: Lazaro Arms, MD;  Location: [facility] BIRTHING SUITES;  Service: Obstetrics;  Laterality: N/A;  . CYSTECTOMY  2002   rmoval from neck  . TOOTH EXTRACTION N/A 11/11/2013   Procedure: EXTRACT 3RD MOLARS PLUS ONE;  Surgeon: Georgia Lopes, DDS;  Location: [facility] OR;  Service: Oral Surgery;  Laterality: N/A;    OB History    Gravida Para Term Preterm AB Living   2 2 2     2    SAB TAB Ectopic Multiple Live Births         0 2       Home Medications    Prior to Admission medications   Medication Sig Start Date End Date Taking? Authorizing Provider  amoxicillin (AMOXIL) 500 MG capsule Take 1 capsule (500 mg total) by mouth 3 (three) times daily. 12/22/16    Elson Areas, PA-C  benazepril (LOTENSIN) 10 MG tablet Take 10 mg by mouth daily.    [provider]  escitalopram (LEXAPRO) 20 MG tablet Take 1 tablet (20 mg total) by mouth daily. 10/19/16   Adline Potter, NP  hydrochlorothiazide (HYDRODIURIL) 25 MG tablet Take 25 mg by mouth daily.    [provider]  metFORMIN (GLUCOPHAGE) 1000 MG tablet Take 1 tablet (1,000 mg total) by mouth 2 (two) times daily with a meal. 03/17/16   Garth Bigness, MD    Family History Family History  Problem Relation Age of Onset  . Heart disease Mother   . Hypertension Mother   . Diabetes Mother   . Heart disease Father   . Hypertension Father   . Hypertension Brother   . Diabetes Brother   . Heart disease Brother   . Asthma Daughter     Social History Social History  Substance Use Topics  . Smoking status: Never Smoker  . Smokeless tobacco: Never Used  . Alcohol use No     Allergies   Tessalon [benzonatate]   Review of Systems Review of Systems  Respiratory: Positive for cough.   Cardiovascular: Positive for chest pain.  All other systems reviewed and are negative.    Physical Exam Updated Vital Signs BP Marland Kitchen)  129/56 (BP Location: Left Arm)   Pulse (!) 105   Temp 97.5 F (36.4 C) (Oral)   Resp 18   Ht 5\' 3"  (1.6 m)   Wt 285 lb (129.3 kg)   LMP 12/09/2016   SpO2 98%   BMI 50.49 kg/m   Physical Exam  Constitutional: She appears well-developed and well-nourished. No distress.  HENT:  Head: Normocephalic and atraumatic.  Right Ear: External ear normal.  Left Ear: External ear normal.  Nose: Nose normal.  Mouth/Throat: Oropharynx is clear and moist.  Eyes: Conjunctivae are normal.  Neck: Neck supple.  Cardiovascular: Normal rate and regular rhythm.   No murmur heard. Pulmonary/Chest: Effort normal and breath sounds normal. No respiratory distress.  Abdominal: Soft. There is no tenderness.  Musculoskeletal: She exhibits no edema.  Neurological:  She is alert.  Skin: Skin is warm and dry.  Psychiatric: She has a normal mood and affect.  Nursing note and vitals reviewed.    ED Treatments / Results  Labs (all labs ordered are listed, but only abnormal results are displayed) Labs Reviewed - No data to display  EKG  EKG Interpretation None       Radiology Dg Chest 2 View  Result Date: 12/22/2016 CLINICAL DATA:  Non-productive cough since Friday with right ear pain. States rib pain when coughing. EXAM: CHEST  2 VIEW COMPARISON:  08/07/2016 FINDINGS: The heart size and mediastinal contours are within normal limits. Both lungs are clear. No pleural effusion pneumothorax. The visualized skeletal structures are unremarkable. IMPRESSION: Normal chest radiographs. Electronically Signed   By: Amie Portlandavid  Ormond M.D.   On: 12/22/2016 21:47    Procedures Procedures (including critical care time)  Medications Ordered in ED Medications - No data to display   Initial Impression / Assessment and Plan / ED Course  I have reviewed the triage vital signs and the nursing notes.  Pertinent labs & imaging results that were available during my care of the patient were reviewed by me and considered in my medical decision making (see chart for details).       Final Clinical Impressions(s) / ED Diagnoses   Final diagnoses:  Bronchitis    New Prescriptions Discharge Medication List as of 12/22/2016  9:56 PM    START taking these medications   Details  amoxicillin (AMOXIL) 500 MG capsule Take 1 capsule (500 mg total) by mouth 3 (three) times daily., Starting Thu 12/22/2016, Print      An After Visit Summary was printed and given to the patient.    Elson AreasSofia, Leslie K, PA-C 12/22/16 2351    Lavera GuiseLiu, Dana Duo, MD 12/23/16 [phone]

## 2016-12-22 NOTE — ED Notes (Signed)
Pt ambulatory to waiting room. Pt verbalized understanding of discharge instructions.   

## 2017-03-01 ENCOUNTER — Encounter: Payer: Self-pay | Admitting: Adult Health

## 2017-03-01 ENCOUNTER — Ambulatory Visit (INDEPENDENT_AMBULATORY_CARE_PROVIDER_SITE_OTHER): Payer: Medicaid Other | Admitting: Adult Health

## 2017-03-01 VITALS — BP 110/70 | HR 92 | Ht 62.0 in | Wt 291.0 lb

## 2017-03-01 DIAGNOSIS — F32A Depression, unspecified: Secondary | ICD-10-CM

## 2017-03-01 DIAGNOSIS — F329 Major depressive disorder, single episode, unspecified: Secondary | ICD-10-CM

## 2017-03-01 MED ORDER — ESCITALOPRAM OXALATE 20 MG PO TABS
20.0000 mg | ORAL_TABLET | Freq: Every day | ORAL | 6 refills | Status: DC
Start: 2017-03-01 — End: 2017-12-12

## 2017-03-01 NOTE — Progress Notes (Signed)
Subjective:     Patient ID: Holly Pope, female   DOB: 1991-06-27, 26 y.o.   MRN: [id number]  HPI Holly Pope is a 26 year old white female back on follow up on lexapro, and she says she is good.Periods more regular now.No complaints.   Review of Systems  Feels better on lexapro Periods more regular Reviewed past medical,surgical, social and family history. Reviewed medications and allergies.     Objective:   Physical Exam BP 110/70 (BP Location: Left Arm, Patient Position: Sitting, Cuff Size: Large)   Pulse 92   Ht 5\' 2"  (1.575 m)   Wt 291 lb (132 kg)   LMP 02/01/2017 (Approximate)   Breastfeeding? No   BMI 53.22 kg/m  Skin warm and dry. Lungs: clear to ausculation bilaterally. Cardiovascular: regular rate and rhythm.   PHQ 2 score 0. Feels good, will continue lexapro.   Assessment:     1. Depression, unspecified depression type       Plan:     Meds ordered this encounter  Medications  . escitalopram (LEXAPRO) 20 MG tablet    Sig: Take 1 tablet (20 mg total) by mouth daily.    Dispense:  30 tablet    Refill:  6    Order Specific Question:   Supervising Provider    Answer:   Duane LopeEURE, LUTHER H [[id number]]  F/U in 6 months for physical or before if needed

## 2017-03-01 NOTE — Patient Instructions (Signed)
FU in 6 months

## 2017-04-17 ENCOUNTER — Emergency Department (HOSPITAL_COMMUNITY)
Admission: EM | Admit: 2017-04-17 | Discharge: 2017-04-17 | Disposition: A | Discharge status: Home / Self Care | Payer: Medicaid Other | Attending: Emergency Medicine | Admitting: Emergency Medicine

## 2017-04-17 ENCOUNTER — Encounter (HOSPITAL_COMMUNITY): Payer: Self-pay | Admitting: Emergency Medicine

## 2017-04-17 DIAGNOSIS — I10 Essential (primary) hypertension: Secondary | ICD-10-CM | POA: Insufficient documentation

## 2017-04-17 DIAGNOSIS — Z79899 Other long term (current) drug therapy: Secondary | ICD-10-CM | POA: Insufficient documentation

## 2017-04-17 DIAGNOSIS — J069 Acute upper respiratory infection, unspecified: Secondary | ICD-10-CM | POA: Diagnosis not present

## 2017-04-17 DIAGNOSIS — E119 Type 2 diabetes mellitus without complications: Secondary | ICD-10-CM | POA: Diagnosis not present

## 2017-04-17 DIAGNOSIS — R0981 Nasal congestion: Secondary | ICD-10-CM | POA: Diagnosis present

## 2017-04-17 LAB — RAPID STREP SCREEN (MED CTR MEBANE ONLY): Streptococcus, Group A Screen (Direct): NEGATIVE

## 2017-04-17 MED ORDER — ACETAMINOPHEN 325 MG PO TABS
650.0000 mg | ORAL_TABLET | Freq: Once | ORAL | Status: AC
Start: 2017-04-17 — End: 2017-04-17
  Administered 2017-04-17: 650 mg via ORAL
  Filled 2017-04-17: qty 2

## 2017-04-17 NOTE — ED Provider Notes (Signed)
[facility]-EMERGENCY DEPT Provider Note   CSN: [id number] Arrival date & time: 04/17/17  [id number]     History   Chief Complaint Chief Complaint  Patient presents with  . URI    HPI   Blood pressure (!) 123/47, pulse 90, temperature 98.2 F (36.8 C), temperature source Oral, resp. rate 18, SpO2 100 %, not currently breastfeeding.  Holly Pope is a 26 y.o. female with past medical history significant for hypertension, non-insulin-dependent diabetes complaining of rhinorrhea, nasal congestion, sore throat and single episode of nonbloody, nonbilious, non-coffee-ground emesis onset this a.m. with some mild diffuse abdominal pain. She denies fevers, chills. On review of systems she notes a dry cough with no chest pain or shortness of breath. Symptoms onset 2 days ago, no exacerbating or alleviating factors identified. No medications taken prior to arrival. States that the cough is worse at night.    Past Medical History:  Diagnosis Date  . Constipation 08/17/2015  . Diabetes mellitus without complication (HCC)   . H/O removal of neck cyst   . Hip dislocation, right (HCC) 05/06/2012  . Hypertension   . Mental disorder   . Morbidly obese [facility](HCC)     Patient Active Problem List   Diagnosis Date Noted  . History of delivery of macrosomal infant 05/10/2016  . Cesarean delivery delivered 03/15/2016  . Constipation 08/17/2015  . Diabetes (HCC)   . Hypertension   . Hip dislocation, right (HCC) 05/06/2012    Past Surgical History:  Procedure Laterality Date  . CESAREAN SECTION N/A 03/15/2016   Procedure: PRIMARY CESAREAN SECTION;  Surgeon: Lazaro ArmsLuther H Eure, MD;  [facility];  Service: Obstetrics;  Laterality: N/A;  . CYSTECTOMY  2002   rmoval from neck  . TOOTH EXTRACTION N/A 11/11/2013   Procedure: EXTRACT 3RD MOLARS PLUS ONE;  Surgeon: Georgia LopesScott M Jensen, DDS;  Location: [facility] OR;  Service: Oral Surgery;  Laterality: N/A;    OB History    Gravida Para Term Preterm AB Living   2 2  2     2    SAB TAB Ectopic Multiple Live Births         0 2       Home Medications    Prior to Admission medications   Medication Sig Start Date End Date Taking? Authorizing Provider  benazepril (LOTENSIN) 10 MG tablet Take 10 mg by mouth daily.    [provider]  escitalopram (LEXAPRO) 20 MG tablet Take 1 tablet (20 mg total) by mouth daily. 03/01/17   Adline PotterGriffin, Jennifer A, NP  hydrochlorothiazide (HYDRODIURIL) 25 MG tablet Take 25 mg by mouth daily.    [provider]  metFORMIN (GLUCOPHAGE) 1000 MG tablet Take 1 tablet (1,000 mg total) by mouth 2 (two) times daily with a meal. 03/17/16   Garth Bignessimberlake, Kathryn, MD    Family History Family History  Problem Relation Age of Onset  . Heart disease Mother   . Hypertension Mother   . Diabetes Mother   . Heart disease Father   . Hypertension Father   . Hypertension Brother   . Diabetes Brother   . Heart disease Brother   . Asthma Daughter     Social History Social History  Substance Use Topics  . Smoking status: Never Smoker  . Smokeless tobacco: Never Used  . Alcohol use No     Allergies   Tessalon [benzonatate]   Review of Systems Review of Systems  A complete review of systems was obtained and all systems are  negative except as noted in the HPI and PMH.   Physical Exam Updated Vital Signs BP (!) 123/47 (BP Location: Left Arm)   Pulse 90   Temp 98.2 F (36.8 C) (Oral)   Resp 18   SpO2 100%   Physical Exam  Constitutional: She appears well-developed and well-nourished.  HENT:  Head: Normocephalic.  Right Ear: External ear normal.  Left Ear: External ear normal.  Mouth/Throat: Oropharynx is clear and moist. No oropharyngeal exudate.  No drooling or stridor. Posterior pharynx mildly erythematous no significant tonsillar hypertrophy. No exudate. Soft palate rises symmetrically. No TTP or induration under tongue.   No tenderness to palpation of frontal or bilateral maxillary sinuses.  Mild  mucosal edema in the nares with scant rhinorrhea.  Bilateral tympanic membranes with normal architecture and good light reflex.    Eyes: Pupils are equal, round, and reactive to light. Conjunctivae and EOM are normal.  Neck: Normal range of motion. Neck supple.  Cardiovascular: Normal rate and regular rhythm.   Pulmonary/Chest: Effort normal and breath sounds normal. No stridor. No respiratory distress. She has no wheezes. She has no rales. She exhibits no tenderness.  Abdominal: Soft. There is no tenderness. There is no rebound and no guarding.  Nursing note and vitals reviewed.    ED Treatments / Results  Labs (all labs ordered are listed, but only abnormal results are displayed) Labs Reviewed  RAPID STREP SCREEN (NOT AT [facility])  CULTURE, GROUP A STREP [facility])    EKG  EKG Interpretation None       Radiology No results found.  Procedures Procedures (including critical care time)  Medications Ordered in ED Medications  acetaminophen (TYLENOL) tablet 650 mg (650 mg Oral Given 04/17/17 8657)     Initial Impression / Assessment and Plan / ED Course  I have reviewed the triage vital signs and the nursing notes.  Pertinent labs & imaging results that were available during my care of the patient were reviewed by me and considered in my medical decision making (see chart for details).    Vitals:   04/17/17 0649  BP: (!) 123/47  Pulse: 90  Resp: 18  Temp: 98.2 F (36.8 C)  TempSrc: Oral  SpO2: 100%    Medications  acetaminophen (TYLENOL) tablet 650 mg (650 mg Oral Given 04/17/17 0921)    Holly Pope is 26 y.o. female presenting with Upper respiratory infection single episode of nonbloody, nonbilious no coffee ground emesis this a.m. Patient is reporting abdominal pain however my exam exam is benign. NAD, Non-toxic appearing, AFVSS, LSCTA. Likely viral illness, advised patient to push fluids, over-the-counter medications for symptom relief.  Evaluation does not  show pathology that would require ongoing emergent intervention or inpatient treatment. Pt is hemodynamically stable and mentating appropriately. Discussed findings and plan with patient/guardian, who agrees with care plan. All questions answered. Return precautions discussed and outpatient follow up given.    Final Clinical Impressions(s) / ED Diagnoses   Final diagnoses:  Upper respiratory tract infection, unspecified type    New Prescriptions New Prescriptions   No medications on file     Kaylyn Lim 04/17/17 8469    Alvira Monday, MD 04/19/17 [phone]

## 2017-04-17 NOTE — Discharge Instructions (Signed)
You can use Flonase nasal spray which is avilable over the counter. Use nasal saline (you can try [employer] Simply Saline) at least 4 times a day, use saline 5-10 minutes before using the fluticasone (flonase) nasal spray ° °Do not use Afrin (Oxymetazoline) ° °Rest, wash hands frequently  and drink plenty of water. ° °You may try over the counter medication such as allegra-decongestant or claritin-decongestant and/or Mucinex or decongestants. ° °Push fluids to try to thin the mucus and get it to flow out the nose.  ° °

## 2017-04-17 NOTE — ED Triage Notes (Signed)
Pt reports 2 days of sore throat, head pressure, nasal congestion. Denies recent fever. VSS.

## 2017-04-19 LAB — CULTURE, GROUP A STREP (THRC)

## 2017-06-01 ENCOUNTER — Emergency Department (HOSPITAL_COMMUNITY): Payer: Medicaid Other

## 2017-06-01 ENCOUNTER — Encounter (HOSPITAL_COMMUNITY): Payer: Self-pay | Admitting: Emergency Medicine

## 2017-06-01 ENCOUNTER — Emergency Department (HOSPITAL_COMMUNITY)
Admission: EM | Admit: 2017-06-01 | Discharge: 2017-06-01 | Disposition: A | Discharge status: Home / Self Care | Payer: Medicaid Other | Attending: Emergency Medicine | Admitting: Emergency Medicine

## 2017-06-01 DIAGNOSIS — R091 Pleurisy: Secondary | ICD-10-CM | POA: Diagnosis not present

## 2017-06-01 DIAGNOSIS — Z7984 Long term (current) use of oral hypoglycemic drugs: Secondary | ICD-10-CM | POA: Diagnosis not present

## 2017-06-01 DIAGNOSIS — E119 Type 2 diabetes mellitus without complications: Secondary | ICD-10-CM | POA: Insufficient documentation

## 2017-06-01 DIAGNOSIS — R0789 Other chest pain: Secondary | ICD-10-CM | POA: Diagnosis not present

## 2017-06-01 DIAGNOSIS — R05 Cough: Secondary | ICD-10-CM | POA: Diagnosis present

## 2017-06-01 DIAGNOSIS — I10 Essential (primary) hypertension: Secondary | ICD-10-CM | POA: Insufficient documentation

## 2017-06-01 DIAGNOSIS — Z79899 Other long term (current) drug therapy: Secondary | ICD-10-CM | POA: Insufficient documentation

## 2017-06-01 MED ORDER — IBUPROFEN 600 MG PO TABS: 600.0000 mg | ORAL_TABLET | Freq: Four times a day (QID) | ORAL | 0 refills | Status: DC | PRN

## 2017-06-01 MED ORDER — GUAIFENESIN-CODEINE 100-10 MG/5ML PO SOLN: 5.0000 mL | Freq: Three times a day (TID) | ORAL | 0 refills | Status: DC | PRN

## 2017-06-01 NOTE — ED Provider Notes (Signed)
[facility] EMERGENCY DEPARTMENT Provider Note   CSN: [id number] Arrival date & time: 06/01/17  1926     History   Chief Complaint Chief Complaint  Patient presents with  . Cough    HPI Holly Pope is a 26 y.o. female.  The history is provided by the patient. No language interpreter was used.  Cough  This is a new problem. Episode onset: 5 days. The problem occurs constantly. The problem has not changed since onset.The cough is non-productive. There has been no fever. Associated symptoms include chest pain. She has tried cough syrup for the symptoms. The treatment provided no relief. Her past medical history does not include pneumonia or asthma.  Pt complains of pain in her ribs with coughing.    Past Medical History:  Diagnosis Date  . Constipation 08/17/2015  . Diabetes mellitus without complication (HCC)   . H/O removal of neck cyst   . Hip dislocation, right (HCC) 05/06/2012  . Hypertension   . Mental disorder   . Morbidly obese [facility](HCC)     Patient Active Problem List   Diagnosis Date Noted  . History of delivery of macrosomal infant 05/10/2016  . Cesarean delivery delivered 03/15/2016  . Constipation 08/17/2015  . Diabetes (HCC)   . Hypertension   . Hip dislocation, right (HCC) 05/06/2012    Past Surgical History:  Procedure Laterality Date  . CESAREAN SECTION N/A 03/15/2016   Procedure: PRIMARY CESAREAN SECTION;  Surgeon: Lazaro ArmsLuther H Eure, MD;  [facility];  Service: Obstetrics;  Laterality: N/A;  . CYSTECTOMY  2002   rmoval from neck  . TOOTH EXTRACTION N/A 11/11/2013   Procedure: EXTRACT 3RD MOLARS PLUS ONE;  Surgeon: Georgia LopesScott M Jensen, DDS;  Location: [facility] OR;  Service: Oral Surgery;  Laterality: N/A;    OB History    Gravida Para Term Preterm AB Living   2 2 2     2    SAB TAB Ectopic Multiple Live Births         0 2       Home Medications    Prior to Admission medications   Medication Sig Start Date End Date Taking? Authorizing  Provider  benazepril (LOTENSIN) 10 MG tablet Take 10 mg by mouth daily.    [provider]  escitalopram (LEXAPRO) 20 MG tablet Take 1 tablet (20 mg total) by mouth daily. 03/01/17   Adline PotterGriffin, Jennifer A, NP  guaiFENesin-codeine 100-10 MG/5ML syrup Take 5 mLs by mouth 3 (three) times daily as needed for cough. 06/01/17   Elson AreasSofia, Channa Hazelett K, PA-C  hydrochlorothiazide (HYDRODIURIL) 25 MG tablet Take 25 mg by mouth daily.    [provider]  ibuprofen (ADVIL,MOTRIN) 600 MG tablet Take 1 tablet (600 mg total) by mouth every 6 (six) hours as needed. 06/01/17   Elson AreasSofia, Jabarie Pop K, PA-C  metFORMIN (GLUCOPHAGE) 1000 MG tablet Take 1 tablet (1,000 mg total) by mouth 2 (two) times daily with a meal. 03/17/16   Garth Bignessimberlake, Kathryn, MD    Family History Family History  Problem Relation Age of Onset  . Heart disease Mother   . Hypertension Mother   . Diabetes Mother   . Heart disease Father   . Hypertension Father   . Hypertension Brother   . Diabetes Brother   . Heart disease Brother   . Asthma Daughter     Social History Social History  Substance Use Topics  . Smoking status: Never Smoker  . Smokeless tobacco: Never Used  . Alcohol  use No     Allergies   Tessalon [benzonatate]   Review of Systems Review of Systems  Respiratory: Positive for cough.   Cardiovascular: Positive for chest pain.  All other systems reviewed and are negative.    Physical Exam Updated Vital Signs BP (!) 141/74 (BP Location: Right Arm)   Pulse 92   Temp 98.8 F (37.1 C) (Oral)   Resp 20   Ht 5\' 2"  (1.575 m)   Wt 133.5 kg (294 lb 4.8 oz)   LMP 05/26/2017   SpO2 99%   BMI 53.83 kg/m   Physical Exam  Constitutional: She appears well-developed and well-nourished. No distress.  HENT:  Head: Normocephalic and atraumatic.  Eyes: Conjunctivae are normal.  Neck: Neck supple.  Cardiovascular: Normal rate and regular rhythm.   No murmur heard. Pulmonary/Chest: Effort normal and breath  sounds normal. No respiratory distress.  Abdominal: Soft. There is no tenderness.  Musculoskeletal: She exhibits no edema.  Tender anterior and posterior ribs bilat  Neurological: She is alert.  Skin: Skin is warm and dry.  Psychiatric: She has a normal mood and affect.  Nursing note and vitals reviewed.    ED Treatments / Results  Labs (all labs ordered are listed, but only abnormal results are displayed) Labs Reviewed - No data to display  EKG  EKG Interpretation None       Radiology Dg Chest 2 View  Result Date: 06/01/2017 CLINICAL DATA:  Chest pain and shortness of breath with cough for 5 days. EXAM: CHEST  2 VIEW COMPARISON:  Dec 22, 2016 FINDINGS: The heart size and mediastinal contours are within normal limits. Both lungs are clear. The visualized skeletal structures are unremarkable. IMPRESSION: No active cardiopulmonary disease. Electronically Signed   By: Sherian Rein M.D.   On: 06/01/2017 20:04    Procedures Procedures (including critical care time)  Medications Ordered in ED Medications - No data to display   Initial Impression / Assessment and Plan / ED Course  I have reviewed the triage vital signs and the nursing notes.  Pertinent labs & imaging results that were available during my care of the patient were reviewed by me and considered in my medical decision making (see chart for details).     Database reviewed.   I suspect viral uri with chest wall pain/pleurisy.  Pt advised to follow up with her Md.  Pt given rx for ibuprofen and Robitussin Ac.  Final Clinical Impressions(s) / ED Diagnoses   Final diagnoses:  Pleurisy    New Prescriptions Discharge Medication List as of 06/01/2017  8:48 PM    START taking these medications   Details  guaiFENesin-codeine 100-10 MG/5ML syrup Take 5 mLs by mouth 3 (three) times daily as needed for cough., Starting Thu 06/01/2017, Print    ibuprofen (ADVIL,MOTRIN) 600 MG tablet Take 1 tablet (600 mg total) by  mouth every 6 (six) hours as needed., Starting Thu 06/01/2017, Print      An After Visit Summary was printed and given to the patient.    Osie Cheeks 06/01/17 2135    Bethann Berkshire, MD 06/01/17 2203

## 2017-06-01 NOTE — ED Triage Notes (Signed)
Pt c/o rib pain when she coughs or sneezes x 5 days.

## 2017-06-19 ENCOUNTER — Encounter (HOSPITAL_COMMUNITY): Payer: Self-pay | Admitting: Emergency Medicine

## 2017-06-19 ENCOUNTER — Emergency Department (HOSPITAL_COMMUNITY)
Admission: EM | Admit: 2017-06-19 | Discharge: 2017-06-20 | Disposition: A | Discharge status: Home / Self Care | Payer: Medicaid Other | Attending: Emergency Medicine | Admitting: Emergency Medicine

## 2017-06-19 ENCOUNTER — Emergency Department (HOSPITAL_COMMUNITY): Payer: Medicaid Other

## 2017-06-19 ENCOUNTER — Other Ambulatory Visit: Payer: Self-pay

## 2017-06-19 DIAGNOSIS — R05 Cough: Secondary | ICD-10-CM | POA: Diagnosis present

## 2017-06-19 DIAGNOSIS — J209 Acute bronchitis, unspecified: Secondary | ICD-10-CM | POA: Diagnosis not present

## 2017-06-19 DIAGNOSIS — I10 Essential (primary) hypertension: Secondary | ICD-10-CM | POA: Insufficient documentation

## 2017-06-19 DIAGNOSIS — Z79899 Other long term (current) drug therapy: Secondary | ICD-10-CM | POA: Diagnosis not present

## 2017-06-19 DIAGNOSIS — Z7984 Long term (current) use of oral hypoglycemic drugs: Secondary | ICD-10-CM | POA: Insufficient documentation

## 2017-06-19 DIAGNOSIS — E119 Type 2 diabetes mellitus without complications: Secondary | ICD-10-CM | POA: Diagnosis not present

## 2017-06-19 MED ORDER — IPRATROPIUM-ALBUTEROL 0.5-2.5 (3) MG/3ML IN SOLN
3.0000 mL | Freq: Once | RESPIRATORY_TRACT | Status: AC
  Administered 2017-06-19: 3 mL via RESPIRATORY_TRACT
  Filled 2017-06-19: qty 3

## 2017-06-19 MED ORDER — PREDNISONE 50 MG PO TABS
60.0000 mg | ORAL_TABLET | Freq: Once | ORAL | Status: AC
  Administered 2017-06-19: 23:00:00 60 mg via ORAL
  Filled 2017-06-19: qty 1

## 2017-06-19 MED ORDER — ALBUTEROL SULFATE HFA 108 (90 BASE) MCG/ACT IN AERS
2.0000 | INHALATION_SPRAY | RESPIRATORY_TRACT | Status: DC | PRN
  Administered 2017-06-20: 2 via RESPIRATORY_TRACT
  Filled 2017-06-19: qty 6.7

## 2017-06-19 MED ORDER — GUAIFENESIN-CODEINE 100-10 MG/5ML PO SOLN: 5.0000 mL | Freq: Three times a day (TID) | ORAL | 0 refills | Status: DC | PRN

## 2017-06-19 MED ORDER — AZITHROMYCIN 250 MG PO TABS: 250.0000 mg | ORAL_TABLET | Freq: Every day | ORAL | 0 refills | Status: DC

## 2017-06-19 NOTE — ED Triage Notes (Addendum)
Pt c/o cough and rib pain x 2 weeks and right ear pain.

## 2017-06-19 NOTE — ED Provider Notes (Signed)
[facility] EMERGENCY DEPARTMENT Provider Note   CSN: [id number] Arrival date & time: 06/19/17  2232     History   Chief Complaint Chief Complaint  Patient presents with  . Cough    HPI Holly Pope is a 26 y.o. female who presents to the ED with cough and congestion. Patient reports that she had the same a couple weeks ago and seem to be getting some better but then got worse again. Patient has been feeling short of breath from coughing so hard. Patient unsure of fever.  The history is provided by the patient. No language interpreter was used.  Cough  This is a recurrent problem. The current episode started more than 2 days ago. The cough is productive of sputum. Associated symptoms include chills, ear congestion, ear pain, rhinorrhea, sore throat, shortness of breath and wheezing. Pertinent negatives include no chest pain, no headaches and no eye redness. She has tried decongestants and cough syrup for the symptoms. Her past medical history is significant for bronchitis.    Past Medical History:  Diagnosis Date  . Constipation 08/17/2015  . Diabetes mellitus without complication (HCC)   . H/O removal of neck cyst   . Hip dislocation, right (HCC) 05/06/2012  . Hypertension   . Mental disorder   . Morbidly obese [facility])     Patient Active Problem List   Diagnosis Date Noted  . History of delivery of macrosomal infant 05/10/2016  . Cesarean delivery delivered 03/15/2016  . Constipation 08/17/2015  . Diabetes (HCC)   . Hypertension   . Hip dislocation, right (HCC) 05/06/2012    Past Surgical History:  Procedure Laterality Date  . CYSTECTOMY  2002   rmoval from neck    OB History    Gravida Para Term Preterm AB Living   2 2 2     2    SAB TAB Ectopic Multiple Live Births         0 2       Home Medications    Prior to Admission medications   Medication Sig Start Date End Date Taking? Authorizing Provider  benazepril (LOTENSIN) 10 MG tablet Take 10 mg by  mouth daily.   Yes [provider]  escitalopram (LEXAPRO) 20 MG tablet Take 1 tablet (20 mg total) by mouth daily. 03/01/17  Yes Cyril Mourning A, NP  hydrochlorothiazide (HYDRODIURIL) 25 MG tablet Take 25 mg by mouth daily.   Yes [provider]  metFORMIN (GLUCOPHAGE) 1000 MG tablet Take 1 tablet (1,000 mg total) by mouth 2 (two) times daily with a meal. 03/17/16  Yes Garth Bigness, MD  azithromycin (ZITHROMAX) 250 MG tablet Take 1 tablet (250 mg total) daily by mouth. Take first 2 tablets together, then 1 every day until finished. 06/19/17   Janne Napoleon, NP  guaiFENesin-codeine 100-10 MG/5ML syrup Take 5 mLs 3 (three) times daily as needed by mouth for cough. 06/19/17   Janne Napoleon, NP  predniSONE (DELTASONE) 50 MG tablet Take one tablet PO daily starting tomorrow 06/21/17. 06/20/17   Janne Napoleon, NP    Family History Family History  Problem Relation Age of Onset  . Heart disease Mother   . Hypertension Mother   . Diabetes Mother   . Heart disease Father   . Hypertension Father   . Hypertension Brother   . Diabetes Brother   . Heart disease Brother   . Asthma Daughter     Social History Social History   Tobacco Use  .  Smoking status: Never Smoker  . Smokeless tobacco: Never Used  Substance Use Topics  . Alcohol use: No  . Drug use: No     Allergies   Tessalon [benzonatate]   Review of Systems Review of Systems  Constitutional: Positive for chills. Negative for fever.  HENT: Positive for congestion, ear pain, rhinorrhea, sinus pressure and sore throat. Negative for trouble swallowing.   Eyes: Negative for discharge and redness.  Respiratory: Positive for cough, shortness of breath and wheezing.   Cardiovascular: Negative for chest pain.  Gastrointestinal: Negative for abdominal pain, diarrhea, nausea and vomiting.  Genitourinary: Negative for dysuria and frequency.  Musculoskeletal: Negative for back pain and neck stiffness.  Skin:  Negative for rash.  Neurological: Negative for syncope and headaches.  Psychiatric/Behavioral: Negative for confusion.     Physical Exam Updated Vital Signs BP 121/71 (BP Location: Right Arm)   Pulse (!) 110   Temp 98.2 F (36.8 C) (Oral)   Resp 17   Ht 5\' 2"  (1.575 m)   Wt 133.4 kg (294 lb)   LMP 05/26/2017   SpO2 96%   BMI 53.77 kg/m   Physical Exam  Constitutional: She is oriented to person, place, and time. She appears well-developed and well-nourished. No distress.  HENT:  Head: Normocephalic and atraumatic.  Right Ear: Tympanic membrane normal.  Left Ear: Tympanic membrane normal.  Nose: Mucosal edema and rhinorrhea present.  Mouth/Throat: Uvula is midline and mucous membranes are normal. No posterior oropharyngeal edema or posterior oropharyngeal erythema.  Eyes: EOM are normal.  Neck: Neck supple.  Cardiovascular: Regular rhythm. Tachycardia present.  Pulmonary/Chest: No respiratory distress. She has decreased breath sounds in the right lower field and the left lower field. Wheezes: occasional. She has no rhonchi. She has no rales.  Abdominal: Soft. There is no tenderness.  Musculoskeletal: Normal range of motion.  Neurological: She is alert and oriented to person, place, and time.  Skin: Skin is warm and dry.  Psychiatric: She has a normal mood and affect. Her behavior is normal.  Nursing note and vitals reviewed.    ED Treatments / Results  Labs (all labs ordered are listed, but only abnormal results are displayed) Labs Reviewed - No data to display   Radiology Dg Chest 2 View  Result Date: 06/19/2017 CLINICAL DATA:  Cough and shortness of breath EXAM: CHEST  2 VIEW COMPARISON:  06/01/2017 FINDINGS: Borderline cardiomegaly. Low lung volumes. No pleural effusion or focal consolidation. Streaky atelectasis at the left base. No pneumothorax. IMPRESSION: Borderline cardiomegaly. Streaky atelectasis at the left lung base. No focal pulmonary infiltrate is seen.  Electronically Signed   By: Jasmine PangKim  Fujinaga M.D.   On: 06/19/2017 23:38    Procedures Procedures (including critical care time)  Medications Ordered in ED Medications  albuterol (PROVENTIL HFA;VENTOLIN HFA) 108 (90 Base) MCG/ACT inhaler 2 puff (2 puffs Inhalation Given 06/20/17 0011)  ipratropium-albuterol (DUONEB) 0.5-2.5 (3) MG/3ML nebulizer solution 3 mL (3 mLs Nebulization Given 06/19/17 2330)  predniSONE (DELTASONE) tablet 60 mg (60 mg Oral Given 06/19/17 2315)     Initial Impression / Assessment and Plan / ED Course  I have reviewed the triage vital signs and the nursing notes. Pt CXR negative for acute infiltrate. Patients symptoms are consistent with bronchitis. Discussed that will prescribe antibiotics this visit since the bronchitis and sinus congestion partially resolved after last visit but returned and are worse this time. Will treat with albuterol inhaler, Z-pak, steriods and Tussionex. Patient to f/u with her PCP in the  next few days.  Patient verbalizes understanding and is agreeable with plan. Pt is hemodynamically stable & in NAD prior to dc.  Final Clinical Impressions(s) / ED Diagnoses   Final diagnoses:  Acute bronchitis, unspecified organism    ED Discharge Orders        Ordered    predniSONE (DELTASONE) 50 MG tablet     06/20/17 0013    azithromycin (ZITHROMAX) 250 MG tablet  Daily     06/19/17 2355    guaiFENesin-codeine 100-10 MG/5ML syrup  3 times daily PRN     06/19/17 2355       Kerrie Buffaloeese, Hope KiowaM, NP 06/20/17 0015    Bethann BerkshireZammit, Joseph, MD 06/20/17 [phone]

## 2017-06-19 NOTE — Discharge Instructions (Signed)
Follow up with your doctor in the next few days for recheck. Return here as needed for worsening symptoms.

## 2017-06-19 NOTE — ED Notes (Signed)
Pt ambulated to Xray at this time.

## 2017-06-19 NOTE — ED Notes (Signed)
Respiratory at bedside.

## 2017-06-20 MED ORDER — PREDNISONE 50 MG PO TABS: ORAL_TABLET | ORAL | 0 refills | Status: DC

## 2017-07-10 ENCOUNTER — Emergency Department (HOSPITAL_COMMUNITY)
Admission: EM | Admit: 2017-07-10 | Discharge: 2017-07-10 | Disposition: A | Discharge status: Home / Self Care | Payer: Medicaid Other | Attending: Emergency Medicine | Admitting: Emergency Medicine

## 2017-07-10 ENCOUNTER — Other Ambulatory Visit: Payer: Self-pay

## 2017-07-10 ENCOUNTER — Encounter (HOSPITAL_COMMUNITY): Payer: Self-pay

## 2017-07-10 ENCOUNTER — Emergency Department (HOSPITAL_COMMUNITY): Payer: Medicaid Other

## 2017-07-10 DIAGNOSIS — Z7984 Long term (current) use of oral hypoglycemic drugs: Secondary | ICD-10-CM | POA: Insufficient documentation

## 2017-07-10 DIAGNOSIS — F99 Mental disorder, not otherwise specified: Secondary | ICD-10-CM | POA: Diagnosis not present

## 2017-07-10 DIAGNOSIS — J209 Acute bronchitis, unspecified: Secondary | ICD-10-CM | POA: Diagnosis not present

## 2017-07-10 DIAGNOSIS — I10 Essential (primary) hypertension: Secondary | ICD-10-CM | POA: Insufficient documentation

## 2017-07-10 DIAGNOSIS — H66003 Acute suppurative otitis media without spontaneous rupture of ear drum, bilateral: Secondary | ICD-10-CM | POA: Insufficient documentation

## 2017-07-10 DIAGNOSIS — E119 Type 2 diabetes mellitus without complications: Secondary | ICD-10-CM | POA: Insufficient documentation

## 2017-07-10 DIAGNOSIS — Z79899 Other long term (current) drug therapy: Secondary | ICD-10-CM | POA: Insufficient documentation

## 2017-07-10 DIAGNOSIS — J40 Bronchitis, not specified as acute or chronic: Secondary | ICD-10-CM

## 2017-07-10 DIAGNOSIS — R05 Cough: Secondary | ICD-10-CM | POA: Diagnosis present

## 2017-07-10 MED ORDER — PREDNISONE 20 MG PO TABS: ORAL_TABLET | ORAL | 0 refills | Status: DC

## 2017-07-10 MED ORDER — CEFDINIR 300 MG PO CAPS: 300.0000 mg | ORAL_CAPSULE | Freq: Two times a day (BID) | ORAL | 0 refills | Status: DC

## 2017-07-10 MED ORDER — PREDNISONE 50 MG PO TABS
60.0000 mg | ORAL_TABLET | Freq: Once | ORAL | Status: AC
  Administered 2017-07-10: 60 mg via ORAL
  Filled 2017-07-10: qty 1

## 2017-07-10 MED ORDER — ALBUTEROL SULFATE HFA 108 (90 BASE) MCG/ACT IN AERS
2.0000 | INHALATION_SPRAY | RESPIRATORY_TRACT | Status: DC | PRN
  Administered 2017-07-10: 2 via RESPIRATORY_TRACT
  Filled 2017-07-10: qty 6.7

## 2017-07-10 NOTE — ED Triage Notes (Addendum)
Pt reports having coughing and congestion x 5 weeks, but "cannot remember what he said" Pt says Dr prescribed anbx (cannot remember name) and reports finishing anbx and symptoms persist. Pt was seen here 3 weeks ago as well.

## 2017-07-10 NOTE — ED Provider Notes (Signed)
[facility] EMERGENCY DEPARTMENT Provider Note   CSN: [id number] Arrival date & time: 07/10/17  2035     History   Chief Complaint Chief Complaint  Patient presents with  . Cough    congestion    HPI Holly Pope is a 26 y.o. female.  Patient presents to the ER for evaluation of head congestion, bilateral earaches, cough.  She reports that her symptoms have been ongoing for 4 or 5 weeks.  Approximately 3 weeks ago she had a course of Zithromax without improvement.  She reports that she has experiencing increased bilateral ear pain with decreased hearing as well as the cough tonight.  She has been running a low-grade fever.      Past Medical History:  Diagnosis Date  . Constipation 08/17/2015  . Diabetes mellitus without complication (HCC)   . H/O removal of neck cyst   . Hip dislocation, right (HCC) 05/06/2012  . Hypertension   . Mental disorder   . Morbidly obese [facility](HCC)     Patient Active Problem List   Diagnosis Date Noted  . History of delivery of macrosomal infant 05/10/2016  . Cesarean delivery delivered 03/15/2016  . Constipation 08/17/2015  . Diabetes (HCC)   . Hypertension   . Hip dislocation, right (HCC) 05/06/2012    Past Surgical History:  Procedure Laterality Date  . CESAREAN SECTION N/A 03/15/2016   Procedure: PRIMARY CESAREAN SECTION;  Surgeon: Lazaro ArmsLuther H Eure, MD;  [facility];  Service: Obstetrics;  Laterality: N/A;  . CYSTECTOMY  2002   rmoval from neck  . TOOTH EXTRACTION N/A 11/11/2013   Procedure: EXTRACT 3RD MOLARS PLUS ONE;  Surgeon: Georgia LopesScott M Jensen, DDS;  Location: [facility] OR;  Service: Oral Surgery;  Laterality: N/A;    OB History    Gravida Para Term Preterm AB Living   2 2 2     2    SAB TAB Ectopic Multiple Live Births         0 2       Home Medications    Prior to Admission medications   Medication Sig Start Date End Date Taking? Authorizing Provider  benazepril (LOTENSIN) 10 MG tablet Take 10 mg by mouth daily.    Yes [provider]  escitalopram (LEXAPRO) 20 MG tablet Take 1 tablet (20 mg total) by mouth daily. 03/01/17  Yes Cyril MourningGriffin, Jennifer A, NP  hydrochlorothiazide (HYDRODIURIL) 25 MG tablet Take 25 mg by mouth daily.   Yes [provider]  metFORMIN (GLUCOPHAGE) 1000 MG tablet Take 1 tablet (1,000 mg total) by mouth 2 (two) times daily with a meal. 03/17/16  Yes Garth Bignessimberlake, Kathryn, MD  azithromycin (ZITHROMAX) 250 MG tablet Take 1 tablet (250 mg total) daily by mouth. Take first 2 tablets together, then 1 every day until finished. Patient not taking: Reported on 07/10/2017 06/19/17   Janne NapoleonNeese, Hope M, NP  cefdinir (OMNICEF) 300 MG capsule Take 1 capsule (300 mg total) by mouth 2 (two) times daily. 07/10/17   Gilda CreasePollina, Christopher J, MD  guaiFENesin-codeine 100-10 MG/5ML syrup Take 5 mLs 3 (three) times daily as needed by mouth for cough. Patient not taking: Reported on 07/10/2017 06/19/17   Janne NapoleonNeese, Hope M, NP  predniSONE (DELTASONE) 20 MG tablet 3 tabs po daily x 3 days, then 2 tabs x 3 days, then 1.5 tabs x 3 days, then 1 tab x 3 days, then 0.5 tabs x 3 days 07/10/17   Gilda CreasePollina, Christopher J, MD    Family History Family History  Problem Relation Age of Onset  . Heart disease Mother   . Hypertension Mother   . Diabetes Mother   . Heart disease Father   . Hypertension Father   . Hypertension Brother   . Diabetes Brother   . Heart disease Brother   . Asthma Daughter     Social History Social History   Tobacco Use  . Smoking status: Never Smoker  . Smokeless tobacco: Never Used  Substance Use Topics  . Alcohol use: No  . Drug use: No     Allergies   Tessalon [benzonatate]   Review of Systems Review of Systems  HENT: Positive for congestion and hearing loss.   Respiratory: Positive for cough.   All other systems reviewed and are negative.    Physical Exam Updated Vital Signs BP 120/62 (BP Location: Right Arm)   Pulse (!) 123   Temp 98.3 F (36.8 C) (Oral)    Resp 20   Ht 5\' 2"  (1.575 m)   Wt 132 kg (291 lb)   LMP 06/26/2017   SpO2 97%   BMI 53.22 kg/m   Physical Exam  Constitutional: She is oriented to person, place, and time. She appears well-developed and well-nourished. No distress.  HENT:  Head: Normocephalic and atraumatic.  Right Ear: Hearing normal. Tympanic membrane is injected, erythematous and retracted.  Left Ear: Hearing normal. Tympanic membrane is injected, erythematous and retracted.  Nose: Mucosal edema present.  Mouth/Throat: Oropharynx is clear and moist and mucous membranes are normal.  Eyes: Conjunctivae and EOM are normal. Pupils are equal, round, and reactive to light.  Neck: Normal range of motion. Neck supple.  Cardiovascular: Regular rhythm, S1 normal and S2 normal. Exam reveals no gallop and no friction rub.  No murmur heard. Pulmonary/Chest: Effort normal and breath sounds normal. No respiratory distress. She exhibits no tenderness.  Abdominal: Soft. Normal appearance and bowel sounds are normal. There is no hepatosplenomegaly. There is no tenderness. There is no rebound, no guarding, no tenderness at McBurney's point and negative Murphy's sign. No hernia.  Musculoskeletal: Normal range of motion.  Neurological: She is alert and oriented to person, place, and time. She has normal strength. No cranial nerve deficit or sensory deficit. Coordination normal. GCS eye subscore is 4. GCS verbal subscore is 5. GCS motor subscore is 6.  Skin: Skin is warm, dry and intact. No rash noted. No cyanosis.  Psychiatric: She has a normal mood and affect. Her speech is normal and behavior is normal. Thought content normal.  Nursing note and vitals reviewed.    ED Treatments / Results  Labs (all labs ordered are listed, but only abnormal results are displayed) Labs Reviewed - No data to display  EKG  EKG Interpretation None       Radiology Dg Chest 2 View  Result Date: 07/10/2017 CLINICAL DATA:  Cough x5 weeks. EXAM:  CHEST  2 VIEW COMPARISON:  06/19/2017 FINDINGS: Slightly low lung volumes with borderline cardiomegaly. Mediastinal contours are stable and within normal limits. No pneumonic consolidation, CHF, effusion or pneumothorax. No acute osseous abnormality. IMPRESSION: No active cardiopulmonary disease. Electronically Signed   By: Tollie Eth M.D.   On: 07/10/2017 23:21    Procedures Procedures (including critical care time)  Medications Ordered in ED Medications  predniSONE (DELTASONE) tablet 60 mg (not administered)  albuterol (PROVENTIL HFA;VENTOLIN HFA) 108 (90 Base) MCG/ACT inhaler 2 puff (not administered)     Initial Impression / Assessment and Plan / ED Course  I have reviewed the  triage vital signs and the nursing notes.  Pertinent labs & imaging results that were available during my care of the patient were reviewed by me and considered in my medical decision making (see chart for details).     Patient presents with complaints of upper respiratory infection symptoms.  She has had cough for 4 or 5 weeks which seems to be consistent with bronchitis.  Chest x-ray does not show pneumonia.  She does, however, have signs of bilateral otitis media.  Will treat with course of Ceftin and prednisone.  Final Clinical Impressions(s) / ED Diagnoses   Final diagnoses:  Acute suppurative otitis media of both ears without spontaneous rupture of tympanic membranes, recurrence not specified  Bronchitis    ED Discharge Orders        Ordered    cefdinir (OMNICEF) 300 MG capsule  2 times daily     07/10/17 2330    predniSONE (DELTASONE) 20 MG tablet     07/10/17 2330       Gilda CreasePollina, Christopher J, MD 07/10/17 2330

## 2017-08-30 ENCOUNTER — Encounter (HOSPITAL_COMMUNITY): Payer: Self-pay | Admitting: Emergency Medicine

## 2017-08-30 ENCOUNTER — Emergency Department (HOSPITAL_COMMUNITY)
Admission: EM | Admit: 2017-08-30 | Discharge: 2017-08-30 | Disposition: A | Discharge status: Home / Self Care | Payer: Medicaid Other | Attending: Emergency Medicine | Admitting: Emergency Medicine

## 2017-08-30 DIAGNOSIS — E119 Type 2 diabetes mellitus without complications: Secondary | ICD-10-CM | POA: Diagnosis not present

## 2017-08-30 DIAGNOSIS — Z7984 Long term (current) use of oral hypoglycemic drugs: Secondary | ICD-10-CM | POA: Diagnosis not present

## 2017-08-30 DIAGNOSIS — Z79899 Other long term (current) drug therapy: Secondary | ICD-10-CM | POA: Insufficient documentation

## 2017-08-30 DIAGNOSIS — I10 Essential (primary) hypertension: Secondary | ICD-10-CM | POA: Insufficient documentation

## 2017-08-30 DIAGNOSIS — J029 Acute pharyngitis, unspecified: Secondary | ICD-10-CM | POA: Diagnosis present

## 2017-08-30 LAB — RAPID STREP SCREEN (MED CTR MEBANE ONLY): Streptococcus, Group A Screen (Direct): NEGATIVE

## 2017-08-30 MED ORDER — HYDROCODONE-ACETAMINOPHEN 7.5-325 MG/15ML PO SOLN
10.0000 mL | Freq: Once | ORAL | Status: AC
  Administered 2017-08-30: 10 mL via ORAL
  Filled 2017-08-30: qty 15

## 2017-08-30 MED ORDER — DEXAMETHASONE 10 MG/ML FOR PEDIATRIC ORAL USE
10.0000 mg | Freq: Once | INTRAMUSCULAR | Status: AC
  Administered 2017-08-30: 10 mg via ORAL
  Filled 2017-08-30: qty 1

## 2017-08-30 MED ORDER — TRAMADOL HCL 50 MG PO TABS: 50.0000 mg | ORAL_TABLET | Freq: Four times a day (QID) | ORAL | 0 refills | Status: DC | PRN

## 2017-08-30 MED ORDER — IBUPROFEN 400 MG PO TABS
600.0000 mg | ORAL_TABLET | Freq: Once | ORAL | Status: AC
  Administered 2017-08-30: 600 mg via ORAL
  Filled 2017-08-30: qty 2

## 2017-08-30 NOTE — ED Provider Notes (Signed)
[facility] EMERGENCY DEPARTMENT Provider Note   CSN: [id number] Arrival date & time: 08/30/17  1626     History   Chief Complaint Chief Complaint  Patient presents with  . Sore Throat    HPI Holly Pope is a 27 y.o. female.  HPI   27 year old female with sore throat.  Symptom onset 3 days ago.  Persistent since then.  Subjective fever.  No cough or other respiratory complaints.  She is tried taking ibuprofen with some improvement.  Bilateral ear pain.  No nausea or vomiting.  Past Medical History:  Diagnosis Date  . Constipation 08/17/2015  . Diabetes mellitus without complication (HCC)   . H/O removal of neck cyst   . Hip dislocation, right (HCC) 05/06/2012  . Hypertension   . Mental disorder   . Morbidly obese [facility])     Patient Active Problem List   Diagnosis Date Noted  . History of delivery of macrosomal infant 05/10/2016  . Cesarean delivery delivered 03/15/2016  . Constipation 08/17/2015  . Diabetes (HCC)   . Hypertension   . Hip dislocation, right (HCC) 05/06/2012    Past Surgical History:  Procedure Laterality Date  . CESAREAN SECTION N/A 03/15/2016   Procedure: PRIMARY CESAREAN SECTION;  Surgeon: Lazaro Arms, MD;  Location: [facility] BIRTHING SUITES;  Service: Obstetrics;  Laterality: N/A;  . CYSTECTOMY  2002   rmoval from neck  . TOOTH EXTRACTION N/A 11/11/2013   Procedure: EXTRACT 3RD MOLARS PLUS ONE;  Surgeon: Georgia Lopes, DDS;  Location: [facility] OR;  Service: Oral Surgery;  Laterality: N/A;    OB History    Gravida Para Term Preterm AB Living   2 2 2     2    SAB TAB Ectopic Multiple Live Births         0 2       Home Medications    Prior to Admission medications   Medication Sig Start Date End Date Taking? Authorizing Provider  azithromycin (ZITHROMAX) 250 MG tablet Take 1 tablet (250 mg total) daily by mouth. Take first 2 tablets together, then 1 every day until finished. Patient not taking: Reported on 07/10/2017 06/19/17   Janne Napoleon, NP    benazepril (LOTENSIN) 10 MG tablet Take 10 mg by mouth daily.    [provider]  cefdinir (OMNICEF) 300 MG capsule Take 1 capsule (300 mg total) by mouth 2 (two) times daily. 07/10/17   Gilda Crease, MD  escitalopram (LEXAPRO) 20 MG tablet Take 1 tablet (20 mg total) by mouth daily. 03/01/17   Adline Potter, NP  guaiFENesin-codeine 100-10 MG/5ML syrup Take 5 mLs 3 (three) times daily as needed by mouth for cough. Patient not taking: Reported on 07/10/2017 06/19/17   Janne Napoleon, NP  hydrochlorothiazide (HYDRODIURIL) 25 MG tablet Take 25 mg by mouth daily.    [provider]  metFORMIN (GLUCOPHAGE) 1000 MG tablet Take 1 tablet (1,000 mg total) by mouth 2 (two) times daily with a meal. 03/17/16   Garth Bigness, MD  predniSONE (DELTASONE) 20 MG tablet 3 tabs po daily x 3 days, then 2 tabs x 3 days, then 1.5 tabs x 3 days, then 1 tab x 3 days, then 0.5 tabs x 3 days 07/10/17   Gilda Crease, MD    Family History Family History  Problem Relation Age of Onset  . Heart disease Mother   . Hypertension Mother   . Diabetes Mother   . Heart disease Father   .  Hypertension Father   . Hypertension Brother   . Diabetes Brother   . Heart disease Brother   . Asthma Daughter     Social History Social History   Tobacco Use  . Smoking status: Never Smoker  . Smokeless tobacco: Never Used  Substance Use Topics  . Alcohol use: No  . Drug use: No     Allergies   Tessalon [benzonatate]   Review of Systems Review of Systems  All systems reviewed and negative, other than as noted in HPI.  Physical Exam Updated Vital Signs BP (!) 156/98 (BP Location: Right Arm)   Pulse (!) 122   Temp 98.3 F (36.8 C) (Oral)   Resp 18   Ht 5\' 2"  (1.575 m)   Wt 133.8 kg (295 lb)   LMP 08/18/2017   SpO2 97%   Breastfeeding? No   BMI 53.96 kg/m   Physical Exam  Constitutional: She appears well-developed and well-nourished. No distress.  HENT:  Head:  Normocephalic and atraumatic.  Nonexudative pharyngitis.  Uvula midline.  Normal sounding voice.  Handling secretions.  Neck is supple.  No stridor.  No adenopathy appreciated.  Submental tissues are soft.  Eyes: Conjunctivae are normal. Right eye exhibits no discharge. Left eye exhibits no discharge.  Neck: Neck supple.  Cardiovascular: Normal rate, regular rhythm and normal heart sounds. Exam reveals no gallop and no friction rub.  No murmur heard. Pulmonary/Chest: Effort normal and breath sounds normal. No respiratory distress.  Abdominal: Soft. She exhibits no distension. There is no tenderness.  Musculoskeletal: She exhibits no edema or tenderness.  Neurological: She is alert.  Skin: Skin is warm and dry.  Psychiatric: She has a normal mood and affect. Her behavior is normal. Thought content normal.  Nursing note and vitals reviewed.    ED Treatments / Results  Labs (all labs ordered are listed, but only abnormal results are displayed) Labs Reviewed - No data to display  EKG  EKG Interpretation None       Radiology No results found.  Procedures Procedures (including critical care time)  Medications Ordered in ED Medications  HYDROcodone-acetaminophen (HYCET) 7.5-325 mg/15 ml solution 10 mL (not administered)  ibuprofen (ADVIL,MOTRIN) tablet 600 mg (not administered)  dexamethasone (DECADRON) 10 MG/ML injection for Pediatric ORAL use 10 mg (not administered)     Initial Impression / Assessment and Plan / ED Course  I have reviewed the triage vital signs and the nursing notes.  Pertinent labs & imaging results that were available during my care of the patient were reviewed by me and considered in my medical decision making (see chart for details).     27 year old female with an exudative pharyngitis.  Rapid strep is negative.  Exam is not concerning for serious deep space head/neck infection.  Plan symptomatic treatment.  Return precautions discussed.  Final  Clinical Impressions(s) / ED Diagnoses   Final diagnoses:  Acute pharyngitis, unspecified etiology    ED Discharge Orders    None       Raeford RazorKohut, Faviola Klare, MD 08/31/17 1458

## 2017-08-30 NOTE — Discharge Instructions (Signed)
Rapid strep test is negative.  This is likely a viral process.  No indication for antibiotics at this time.  Symptomatic treatment.  Try to drink plenty of fluids.  Take some type of NSAID such as ibuprofen as needed for pain or fever.  Take prescribed pain medicine if this is not sufficient.

## 2017-08-30 NOTE — ED Notes (Signed)
EDP at bedside  

## 2017-08-30 NOTE — ED Triage Notes (Signed)
Pt reports sore throat for last several days. Pt denies any cough,fever. Airway patent. nad noted.

## 2017-09-02 LAB — CULTURE, GROUP A STREP (THRC)

## 2017-10-19 ENCOUNTER — Telehealth: Payer: Self-pay | Admitting: Adult Health

## 2017-10-24 ENCOUNTER — Other Ambulatory Visit: Payer: Self-pay | Admitting: Obstetrics and Gynecology

## 2017-10-24 DIAGNOSIS — O3680X0 Pregnancy with inconclusive fetal viability, not applicable or unspecified: Secondary | ICD-10-CM

## 2017-10-25 ENCOUNTER — Ambulatory Visit (INDEPENDENT_AMBULATORY_CARE_PROVIDER_SITE_OTHER): Payer: Medicaid Other | Admitting: Women's Health

## 2017-10-25 ENCOUNTER — Encounter: Payer: Self-pay | Admitting: Women's Health

## 2017-10-25 ENCOUNTER — Other Ambulatory Visit: Payer: Self-pay | Admitting: Obstetrics and Gynecology

## 2017-10-25 ENCOUNTER — Ambulatory Visit (INDEPENDENT_AMBULATORY_CARE_PROVIDER_SITE_OTHER): Payer: Medicaid Other

## 2017-10-25 VITALS — BP 150/88 | HR 120 | Ht 62.0 in | Wt 307.5 lb

## 2017-10-25 DIAGNOSIS — O3680X0 Pregnancy with inconclusive fetal viability, not applicable or unspecified: Secondary | ICD-10-CM

## 2017-10-25 DIAGNOSIS — O021 Missed abortion: Secondary | ICD-10-CM

## 2017-10-25 DIAGNOSIS — Z3A09 9 weeks gestation of pregnancy: Secondary | ICD-10-CM | POA: Diagnosis not present

## 2017-10-25 DIAGNOSIS — Z3A01 Less than 8 weeks gestation of pregnancy: Secondary | ICD-10-CM | POA: Diagnosis not present

## 2017-10-25 DIAGNOSIS — O039 Complete or unspecified spontaneous abortion without complication: Secondary | ICD-10-CM

## 2017-10-25 MED ORDER — MISOPROSTOL 200 MCG PO TABS: 800.0000 ug | ORAL_TABLET | Freq: Once | ORAL | 1 refills | Status: DC

## 2017-10-25 NOTE — Patient Instructions (Signed)
FACTS YOU SHOULD KNOW  About Early Pregnancy Loss  WHAT IS AN EARLY PREGNANCY LOSS? Once the egg is fertilized with the sperm and begins to develop, it attaches to the lining of the uterus. This early pregnancy tissue may not develop into an embryo (the beginning stage of a baby). Sometimes an embryo does develop but does not continue to grow. These problems can be seen on ultrasound.   MANAGEMNT OF EARLY PREGNANCY LOSS: About 4 out of 100 (0.25%) women will have a pregnancy loss in her lifetime.  One in five pregnancies is found to be an early pregnancy loss.  There are 3 ways to care for an early pregnancy loss:   (1) Surgery, (2) Medicine, (3) Waiting for you to pass the pregnancy on your own. The decision as to how to proceed after being diagnosed with and early pregnancy loss is an individual one.  The decision can be made only after appropriate counseling.  You need to weigh the pros and cons of the 3 choices. Then you can make the choice that works for you.  SURGERY (D&E) . Procedure over in 1 day . Requires being put to sleep . Bleeding may be light . Possible problems during surgery, including injury to womb(uterus) . Care provider has more control Medicine (CYTOTEC) . The complete procedure may take days to weeks . No Surgery . Bleeding may be heavy at times . There may be drug side effects . Patient has more control Waiting . You may choose to wait, in which case your own body may complete the passing of the abnormal early pregnancy on its own in about 2-4 weeks . Your bleeding may be heavy at times . There is a small possibility that you may need surgery if the bleeding is too much or not all of the pregnancy has passed.  CYTOTEC MANAGEMENT Prostaglandins (cytotec) are the most widely used drug for this purpose. They cause the uterus to cramp and contract. You will place the medicine yourself inside your vagina in the privacy of your home. Empting of the uterus should occur  within 3 days but the process may continue for several weeks. The bleeding may seem heavy at times.  INSTRUCTIONS: Take all 4 tablets of cytotec (800mcg total) at one time. This will cause a lot of cramping, you may have bleeding, and pass tissue, then the cramping and bleeding should get better. If you do not pass the tissue, then you can take 4 more tablets of cytotec (800mcg total) 48 hours after your first dose.  You will come back to have your blood drawn to make sure the pregnancy hormones are dropping in 1 week. Please call us if you have any questions.   POSSIBLE SIDE EFFECTS FROM CYTOTEC . Nausea  Vomiting . Diarrhea Fever . Chills  Hot Flashes Side effects  from the process of the early pregnancy loss include: . Cramping  Bleeding . Headaches  Dizziness RISKS: This is a low risk procedure. Less than 1 in 100 women has a complication. An incomplete passage of the early pregnancy may occur. Also, hemorrhage (heavy bleeding) could happen.  Rarely the pregnancy will not be passed completely. Excessively heavy bleeding may occur.  Your doctor may need to perform surgery to empty the uterus (D&E). Afterwards: Everybody will feel differently after the early pregnancy loss completion. You may have soreness or cramps for a day or two. You may have soreness or cramps for day or two.  You may have light   bleeding for up to 2 weeks. You may be as active as you feel like being. If you have any of the following problems you may call [facility] at [phone] or [facility]missions [facility] at [phone] if it is after hours. . If you have pain that does not get better with pain medication . Bleeding that soaks through 2 thick full-sized sanitary pads in an hour . Cramps that last longer than 2 days . Foul smelling discharge . Fever above 100.4 degrees F Even if you do not have any of these symptoms, you should have a follow-up exam to make sure you are healing properly. Your next normal period will  usually start again in 4-6 week after the loss. You can get pregnant soon after the loss, so use birth control right away. Finally: Make sure all your questions are answered before during and after any procedure. Follow up with medical care and family planning methods.      

## 2017-10-25 NOTE — Progress Notes (Signed)
US TV: ? 5+5 wks fetal pole w/in lower uterine segment,no fht visualized,irregular GS 6.7 mm,normal ovaries bilat

## 2017-10-25 NOTE — Progress Notes (Signed)
   GYN VISIT Patient name: Holly Pope Dinkins MRN [id number]  Date of birth: 05/03/1991 Chief Complaint:   discuss US  History of Present Illness:   Holly Pope [place]am is a 27 y.o. 142P2002 Caucasian female at 3633w5d by certain LMP being seen today for miscarriage. [facility], had u/s w/ no FHT. Has had daily vaginal bleeding x 3 weeks, just enough to wear a pad. No cramping. Today's u/s reveals GS c/w 6823w2d, CRL c/w 7979w5d, no FHT w/ fetal pole in LUS.      Patient's last menstrual period was 10/03/2017. Review of Systems:   Pertinent items are noted in HPI Denies fever/chills, dizziness, headaches, visual disturbances, fatigue, shortness of breath, chest pain, abdominal pain, vomiting, abnormal vaginal discharge/itching/odor/irritation, problems with periods, bowel movements, urination, or intercourse unless otherwise stated above.  Pertinent History Reviewed:  Reviewed past medical,surgical, social, obstetrical and family history.  Reviewed problem list, medications and allergies. Physical Assessment:   Vitals:   10/25/17 0958  BP: (!) 150/88  Pulse: (!) 120  Weight: (!) 307 lb 8 oz (139.5 kg)  Height: 5\' 2"  (1.575 m)  Body mass index is 56.24 kg/m.       Physical Examination:   General appearance: alert, well appearing, and in no distress  Mental status: alert, oriented to person, place, and time  Skin: warm & dry   Cardiovascular: normal heart rate noted  Respiratory: normal respiratory effort, no distress  Abdomen: soft, non-tender   Pelvic: examination not indicated  Extremities: no edema  Today's U/S:  US TV: ? 5+5 wks fetal pole w/in lower uterine segment,no fht visualized,irregular GS 6.7 mm,normal ovaries bilat  No results found for this or any previous visit (from the past 24 hour(s)).  Assessment & Plan:  1) Missed Ab> CRL c/w 6379w5d, no FCA, w/ fetal pole in LUS, reviewed u/s w/ JVF who recommends cytotec. Discussed w/ pt, wants to proceed w/ cytotec. Rx  cytotec 800mcg vaginally x 1, repeat in 48hr if needed. Discussed what to expect w/ miscarriage as well as warning s/s and reasons to seek care. Will get CBC and BHCG. Pt is Rh+.   Meds:  Meds ordered this encounter  Medications  . misoprostol (CYTOTEC) 200 MCG tablet    Sig: Place 4 tablets (800 mcg total) vaginally once for 1 dose. May repeat in 48 hours if needed    Dispense:  4 tablet    Refill:  1    Order Specific Question:   Supervising Provider    Answer:   Duane LopeEURE, LUTHER H [[id number]]    Orders Placed This Encounter  Procedures  . CBC  . Beta hCG quant (ref lab)    Return in about 1 week (around 11/01/2017) for F/U.  Cheral MarkerKimberly R Valeska Haislip CNM, [facility] 10/25/2017 11:20 AM

## 2017-10-26 LAB — CBC
Hematocrit: 35 % (ref 34.0–46.6)
Hemoglobin: 11.2 g/dL (ref 11.1–15.9)
MCH: 25.5 pg — ABNORMAL LOW (ref 26.6–33.0)
MCHC: 32 g/dL (ref 31.5–35.7)
MCV: 80 fL (ref 79–97)
Platelets: 470 10*3/uL — ABNORMAL HIGH (ref 150–379)
RBC: 4.4 x10E6/uL (ref 3.77–5.28)
RDW: 16 % — ABNORMAL HIGH (ref 12.3–15.4)
WBC: 11.5 10*3/uL — ABNORMAL HIGH (ref 3.4–10.8)

## 2017-10-26 LAB — BETA HCG QUANT (REF LAB): hCG Quant: 1468 m[IU]/mL

## 2017-11-02 ENCOUNTER — Ambulatory Visit (INDEPENDENT_AMBULATORY_CARE_PROVIDER_SITE_OTHER): Payer: Medicaid Other | Admitting: Women's Health

## 2017-11-02 ENCOUNTER — Encounter: Payer: Self-pay | Admitting: Women's Health

## 2017-11-02 VITALS — BP 110/70 | HR 96 | Ht 62.0 in | Wt 304.0 lb

## 2017-11-02 DIAGNOSIS — Z3A01 Less than 8 weeks gestation of pregnancy: Secondary | ICD-10-CM | POA: Diagnosis not present

## 2017-11-02 DIAGNOSIS — O039 Complete or unspecified spontaneous abortion without complication: Secondary | ICD-10-CM

## 2017-11-02 MED ORDER — MISOPROSTOL 200 MCG PO TABS: 800.0000 ug | ORAL_TABLET | Freq: Once | ORAL | 1 refills | Status: DC

## 2017-11-02 NOTE — Progress Notes (Signed)
   GYN VISIT Patient name: Holly Pope MRN [id number]  Date of birth: 1990/12/05 Chief Complaint:   Follow-up (miscarriage)  History of Present Illness:   Holly BarryDanielle N Musil is a 27 y.o. 413P2012 Caucasian female being seen today for f/u missed Ab, dx on 3/20. U/S that day revealed GS c/s 1753w2d, CRL c/w 4334w5d no FHT w/ fetal pole in LUS. Took cytotec 800mcg pv x2 doses with only small amt bleeding, no cramping.   Patient's last menstrual period was 10/03/2017. Review of Systems:   Pertinent items are noted in HPI Denies fever/chills, dizziness, headaches, visual disturbances, fatigue, shortness of breath, chest pain, abdominal pain, vomiting, abnormal vaginal discharge/itching/odor/irritation, problems with periods, bowel movements, urination, or intercourse unless otherwise stated above.  Pertinent History Reviewed:  Reviewed past medical,surgical, social, obstetrical and family history.  Reviewed problem list, medications and allergies. Physical Assessment:   Vitals:   11/02/17 1416  BP: 110/70  Pulse: 96  Weight: (!) 304 lb (137.9 kg)  Height: 5\' 2"  (1.575 m)  Body mass index is 55.6 kg/m.       Physical Examination:   General appearance: alert, well appearing, and in no distress  Mental status: alert, oriented to person, place, and time  Skin: warm & dry   Cardiovascular: normal heart rate noted  Respiratory: normal respiratory effort, no distress  Abdomen: soft, non-tender   Pelvic: informal transvaginal u/s reveals GS still w/in uterus per JVF  Extremities: no edema   No results found for this or any previous visit (from the past 24 hour(s)).  Assessment & Plan:  1) Missed Ab> CRL c/w 6634w5d on 3/20, s/p cytotec 800mcg pv x 2, GS still w/in uterus, per JVF repeat cytotec 800mcg po x 1, then again in 48hrs if needed, f/u 1wk. Will hold off on repeat BHCG until next week. Rh+.  Pt states she wants BTL after miscarriage complete, will schedule w/ MD for 1wk f/u to  discuss  Meds:  Meds ordered this encounter  Medications  . misoprostol (CYTOTEC) 200 MCG tablet    Sig: Take 4 tablets (800 mcg total) by mouth once for 1 dose. Repeat in 48 hours if needed    Dispense:  4 tablet    Refill:  1    Order Specific Question:   Supervising Provider    Answer:   Despina HiddenEURE, LUTHER H [[id number]]    No orders of the defined types were placed in this encounter.   Return in about 1 week (around 11/09/2017) for F/U w/ MD.  Cheral MarkerKimberly R Booker CNM, WHNP-BC 11/02/2017 3:27 PM

## 2017-11-02 NOTE — Patient Instructions (Signed)
FACTS YOU SHOULD KNOW  About Early Pregnancy Loss  WHAT IS AN EARLY PREGNANCY LOSS? Once the egg is fertilized with the sperm and begins to develop, it attaches to the lining of the uterus. This early pregnancy tissue may not develop into an embryo (the beginning stage of a baby). Sometimes an embryo does develop but does not continue to grow. These problems can be seen on ultrasound.   MANAGEMNT OF EARLY PREGNANCY LOSS: About 4 out of 100 (0.25%) women will have a pregnancy loss in her lifetime.  One in five pregnancies is found to be an early pregnancy loss.  There are 3 ways to care for an early pregnancy loss:   (1) Surgery, (2) Medicine, (3) Waiting for you to pass the pregnancy on your own. The decision as to how to proceed after being diagnosed with and early pregnancy loss is an individual one.  The decision can be made only after appropriate counseling.  You need to weigh the pros and cons of the 3 choices. Then you can make the choice that works for you.  SURGERY (D&E) . Procedure over in 1 day . Requires being put to sleep . Bleeding may be light . Possible problems during surgery, including injury to womb(uterus) . Care provider has more control Medicine (CYTOTEC) . The complete procedure may take days to weeks . No Surgery . Bleeding may be heavy at times . There may be drug side effects . Patient has more control Waiting . You may choose to wait, in which case your own body may complete the passing of the abnormal early pregnancy on its own in about 2-4 weeks . Your bleeding may be heavy at times . There is a small possibility that you may need surgery if the bleeding is too much or not all of the pregnancy has passed.  CYTOTEC MANAGEMENT Prostaglandins (cytotec) are the most widely used drug for this purpose. They cause the uterus to cramp and contract. You will place the medicine yourself inside your vagina in the privacy of your home. Empting of the uterus should occur  within 3 days but the process may continue for several weeks. The bleeding may seem heavy at times.  INSTRUCTIONS: Take all 4 tablets of cytotec (800mcg total) at one time. This will cause a lot of cramping, you may have bleeding, and pass tissue, then the cramping and bleeding should get better. If you do not pass the tissue, then you can take 4 more tablets of cytotec (800mcg total) 48 hours after your first dose.  You will come back to have your blood drawn to make sure the pregnancy hormones are dropping in 1 week. Please call us if you have any questions.   POSSIBLE SIDE EFFECTS FROM CYTOTEC . Nausea  Vomiting . Diarrhea Fever . Chills  Hot Flashes Side effects  from the process of the early pregnancy loss include: . Cramping  Bleeding . Headaches  Dizziness RISKS: This is a low risk procedure. Less than 1 in 100 women has a complication. An incomplete passage of the early pregnancy may occur. Also, hemorrhage (heavy bleeding) could happen.  Rarely the pregnancy will not be passed completely. Excessively heavy bleeding may occur.  Your doctor may need to perform surgery to empty the uterus (D&E). Afterwards: Everybody will feel differently after the early pregnancy loss completion. You may have soreness or cramps for a day or two. You may have soreness or cramps for day or two.  You may have light   bleeding for up to 2 weeks. You may be as active as you feel like being. If you have any of the following problems you may call [facility] at [phone] or [facility]missions [facility] at [phone] if it is after hours. . If you have pain that does not get better with pain medication . Bleeding that soaks through 2 thick full-sized sanitary pads in an hour . Cramps that last longer than 2 days . Foul smelling discharge . Fever above 100.4 degrees F Even if you do not have any of these symptoms, you should have a follow-up exam to make sure you are healing properly. Your next normal period will  usually start again in 4-6 week after the loss. You can get pregnant soon after the loss, so use birth control right away. Finally: Make sure all your questions are answered before during and after any procedure. Follow up with medical care and family planning methods.      

## 2017-11-09 ENCOUNTER — Encounter: Payer: Self-pay | Admitting: Obstetrics & Gynecology

## 2017-11-09 ENCOUNTER — Ambulatory Visit (INDEPENDENT_AMBULATORY_CARE_PROVIDER_SITE_OTHER): Payer: Medicaid Other | Admitting: Obstetrics & Gynecology

## 2017-11-09 VITALS — BP 122/70 | HR 80 | Ht 62.0 in | Wt 306.5 lb

## 2017-11-09 DIAGNOSIS — Z302 Encounter for sterilization: Secondary | ICD-10-CM

## 2017-11-09 DIAGNOSIS — Z01818 Encounter for other preprocedural examination: Secondary | ICD-10-CM | POA: Diagnosis not present

## 2017-11-09 NOTE — Progress Notes (Signed)
Preoperative History and Physical  Holly Pope is a 27 y.o. [id number] with No LMP recorded. admitted for a mini lap for tubal ligation.  Pt pannus makes laparoscopy of pelvis not possible  PMH:    Past Medical History:  Diagnosis Date  . Constipation 08/17/2015  . Diabetes mellitus without complication (HCC)   . H/O removal of neck cyst   . Hip dislocation, right (HCC) 05/06/2012  . Hypertension   . Mental disorder   . Morbidly obese (HCC)     PSH:     Past Surgical History:  Procedure Laterality Date  . CESAREAN SECTION N/A 03/15/2016   Procedure: PRIMARY CESAREAN SECTION;  Surgeon: Lazaro Arms, MD;  Location: [facility] BIRTHING SUITES;  Service: Obstetrics;  Laterality: N/A;  . CYSTECTOMY  2002   rmoval from neck  . TOOTH EXTRACTION N/A 11/11/2013   Procedure: EXTRACT 3RD MOLARS PLUS ONE;  Surgeon: Georgia Lopes, DDS;  Location: [facility] OR;  Service: Oral Surgery;  Laterality: N/A;    POb/GynH:      OB History    Gravida  3   Para  2   Term  2   Preterm      AB  1   Living  2     SAB  1   TAB      Ectopic      Multiple  0   Live Births  2           SH:   Social History   Tobacco Use  . Smoking status: Never Smoker  . Smokeless tobacco: Never Used  Substance Use Topics  . Alcohol use: No  . Drug use: No    FH:    Family History  Problem Relation Age of Onset  . Heart disease Mother   . Hypertension Mother   . Diabetes Mother   . Heart disease Father   . Hypertension Father   . Cancer Father        brain  . Hypertension Brother   . Diabetes Brother   . Heart disease Brother   . Asthma Daughter      Allergies:  Allergies  Allergen Reactions  . Tessalon [Benzonatate] Rash    Medications:       Current Outpatient Medications:  .  benazepril (LOTENSIN) 10 MG tablet, Take 10 mg by mouth daily., Disp: , Rfl:  .  escitalopram (LEXAPRO) 20 MG tablet, Take 1 tablet (20 mg total) by mouth daily., Disp: 30 tablet, Rfl: 6 .  hydrochlorothiazide  (HYDRODIURIL) 25 MG tablet, Take 25 mg by mouth daily., Disp: , Rfl:  .  metFORMIN (GLUCOPHAGE) 1000 MG tablet, Take 1 tablet (1,000 mg total) by mouth 2 (two) times daily with a meal., Disp: 60 tablet, Rfl: 2 .  predniSONE (DELTASONE) 20 MG tablet, 3 tabs po daily x 3 days, then 2 tabs x 3 days, then 1.5 tabs x 3 days, then 1 tab x 3 days, then 0.5 tabs x 3 days, Disp: 27 tablet, Rfl: 0  Review of Systems:   Review of Systems  Constitutional: Negative for fever, chills, weight loss, malaise/fatigue and diaphoresis.  HENT: Negative for hearing loss, ear pain, nosebleeds, congestion, sore throat, neck pain, tinnitus and ear discharge.   Eyes: Negative for blurred vision, double vision, photophobia, pain, discharge and redness.  Respiratory: Negative for cough, hemoptysis, sputum production, shortness of breath, wheezing and stridor.   Cardiovascular: Negative for chest pain, palpitations, orthopnea, claudication, leg swelling and PND.  Gastrointestinal: Positive for abdominal pain. Negative for heartburn, nausea, vomiting, diarrhea, constipation, blood in stool and melena.  Genitourinary: Negative for dysuria, urgency, frequency, hematuria and flank pain.  Musculoskeletal: Negative for myalgias, back pain, joint pain and falls.  Skin: Negative for itching and rash.  Neurological: Negative for dizziness, tingling, tremors, sensory change, speech change, focal weakness, seizures, loss of consciousness, weakness and headaches.  Endo/Heme/Allergies: Negative for environmental allergies and polydipsia. Does not bruise/bleed easily.  Psychiatric/Behavioral: Negative for depression, suicidal ideas, hallucinations, memory loss and substance abuse. The patient is not nervous/anxious and does not have insomnia.      PHYSICAL EXAM:  Blood pressure 122/70, pulse 80, height 5\' 2"  (1.575 m), weight (!) 306 lb 8 oz (139 kg), not currently breastfeeding.    Vitals reviewed. Constitutional: She is oriented  to person, place, and time. She appears well-developed and well-nourished.  HENT:  Head: Normocephalic and atraumatic.  Right Ear: External ear normal.  Left Ear: External ear normal.  Nose: Nose normal.  Mouth/Throat: Oropharynx is clear and moist.  Eyes: Conjunctivae and EOM are normal. Pupils are equal, round, and reactive to light. Right eye exhibits no discharge. Left eye exhibits no discharge. No scleral icterus.  Neck: Normal range of motion. Neck supple. No tracheal deviation present. No thyromegaly present.  Cardiovascular: Normal rate, regular rhythm, normal heart sounds and intact distal pulses.  Exam reveals no gallop and no friction rub.   No murmur heard. Respiratory: Effort normal and breath sounds normal. No respiratory distress. She has no wheezes. She has no rales. She exhibits no tenderness.  GI: Soft. Bowel sounds are normal. She exhibits no distension and no mass. There is tenderness. There is no rebound and no guarding.  Genitourinary:       Vulva is normal without lesions Vagina is pink moist without discharge Cervix normal in appearance and pap is normal Uterus is normal size, contour, position, consistency, mobility, non-tender Adnexa is negative with normal sized ovaries by sonogram  Musculoskeletal: Normal range of motion. She exhibits no edema and no tenderness.  Neurological: She is alert and oriented to person, place, and time. She has normal reflexes. She displays normal reflexes. No cranial nerve deficit. She exhibits normal muscle tone. Coordination normal.  Skin: Skin is warm and dry. No rash noted. No erythema. No pallor.  Psychiatric: She has a normal mood and affect. Her behavior is normal. Judgment and thought content normal.    Labs: No results found for this or any previous visit (from the past 336 hour(s)).  EKG: Orders placed or performed during the hospital encounter of 11/07/13  . EKG 12 lead  . EKG 12 lead    Imaging Studies: US Ob  Transvaginal  Result Date: 10/25/2017 DATING AND VIABILITY SONOGRAM Holly Pope is a 27 y.o. year old G2P2002 with LMP 08/18/2017 which would correlate to  9+[redacted] weeks gestation.  She has regular menstrual cycles.   She is here today for a viability Pope scan. Holly Pope [facility],no fetal heart tones visualized.  She has also had vaginal bleeding for three weeks. GESTATION:SINGLETON   FETAL ACTIVITY:          Heart rate         No fht        CERVIX: Appears closed ADNEXA: The ovaries are normal. GESTATIONAL AGE AND  BIOMETRICS: Gestational criteria: Estimated Date of Delivery: 05/25/2018 by LMP now at 9+5 wks Previous Scans:1 outside Pope GESTATIONAL [facility]  6.7 mm         5+2 weeks CROWN RUMP LENGTH           2.5 mm         5+5 weeks                                                                      AVERAGE EGA(BY THIS SCAN):  5+5 weeks  TECHNICIAN COMMENTS: US TV: ? 5+5 wks fetal pole w/in lower uterine segment,no fht visualized,irregular GS 6.7 mm,normal ovaries bilat A copy of this report including all images has been saved and backed up to a second source for retrieval if needed. All measures and details of the anatomical scan, placentation, fluid volume and pelvic anatomy are contained in that report. Amber Flora LippsJ Carl 10/25/2017 10:45 AM Clinical Impression and recommendations: I have reviewed the sonogram results above.  A collapsed gestational sac is noted in the lower uterine segment History reports a intrauterine sac noted 3 weeks ago at outside hospital Combined with the patient's current clinical course, below are my impressions and any appropriate recommendations for management based on the sonographic findings: 1.  Failed pregnancy with miscarriage in process 2.  Would consider Cytotec to expedite completion of miscarriage 3.  Normal adnexa no suspicion of extrauterine pregnancy Tilda BurrowJohn V Ferguson      Assessment: Multiparous desires permanent  sterilization  Morbid obesity with large cental obesity making laparoscopy impossible  Diabetes   Patient Active Problem List   Diagnosis Date Noted  . Miscarriage 10/25/2017  . History of delivery of macrosomal infant 05/10/2016  . Cesarean delivery delivered 03/15/2016  . Constipation 08/17/2015  . Diabetes (HCC)   . Hypertension   . Hip dislocation, right (HCC) 05/06/2012    Plan: Partial salpingectomy through a mini laparotomy incision using Harmonic scalpel for sterilization(patient's abdominal depth makes it impossible to reach the tubes etc with laparoscopic instruments)  Amaryllis DykeLuther H Mykle [place]ua 11/09/2017 4:19 PM

## 2017-12-12 NOTE — Patient Instructions (Signed)
Holly Pope  12/12/2017     @   Your procedure is scheduled on  12/20/2017   Report to [facility] at  730   A.M.  Call this number if you have problems the morning of surgery:  [phone]   Remember:  Do not eat food or drink liquids after midnight.  Take these medicines the morning of surgery with A SIP OF WATER  Lotensin, lexapro.   Do not wear jewelry, make-up or nail polish.  Do not wear lotions, powders, or perfumes, or deodorant.  Do not shave 48 hours prior to surgery.  Men may shave face and neck.  Do not bring valuables to the hospital.  [facility] is not responsible for any belongings or valuables.  Contacts, dentures or bridgework may not be worn into surgery.  Leave your suitcase in the car.  After surgery it may be brought to your room.  For patients admitted to the hospital, discharge time will be determined by your treatment team.  Patients discharged the day of surgery will not be allowed to drive home.   Name and phone number of your driver:   family Special instructions:  None  Please read over the following fact sheets that you were given. Anesthesia Post-op Instructions and Care and Recovery After Surgery       Salpingectomy Salpingectomy, also called tubectomy, is the surgical removal of one of the fallopian tubes. The fallopian tubes are where eggs travel from the ovaries to the uterus. Removing one fallopian tube does not prevent you from becoming pregnant. It also does not cause problems with your menstrual periods. You may need a salpingectomy if you:  Have a fertilized egg that attaches to the fallopian tube (ectopic pregnancy), especially one that causes the tube to burst or tear (rupture).  Have an infected fallopian tube.  Have cancer of the fallopian tube or nearby organs.  Have had an ovary removed due to a cyst or tumor.  Have had your uterus removed.  There are three different methods that  can be used for a salpingectomy:  Open. This method involves making one large incision in your abdomen.  Laparoscopic. This method involves using a thin, lighted tube with a tiny camera on the end (laparoscope) to help perform the procedure. The laparoscope will allow your surgeon to make several small incisions in the abdomen instead of a large incision.  Robot-assisted: This method involves using a computer to control surgical instruments that are attached to robotic arms.  Tell a health care provider about:  Any allergies you have.  All medicines you are taking, including vitamins, herbs, eye drops, creams, and over-the-counter medicines.  Any problems you or family members have had with anesthetic medicines.  Any blood disorders you have.  Any surgeries you have had.  Any medical conditions you have.  Whether you are pregnant or may be pregnant. What are the risks? Generally, this is a safe procedure. However, problems may occur, including:  Infection.  Bleeding.  Allergic reactions to medicines.  Damage to other structures or organs.  Blood clots in the legs or lungs.  What happens before the procedure? Staying hydrated Follow instructions from your health care provider about hydration, which may include:  Up to 2 hours before the procedure - you may continue to drink clear liquids, such as water, clear fruit juice, black coffee, and plain tea.  Eating and drinking restrictions Follow instructions from  your health care provider about eating and drinking, which may include:  8 hours before the procedure - stop eating heavy meals or foods such as meat, fried foods, or fatty foods.  6 hours before the procedure - stop eating light meals or foods, such as toast or cereal.  6 hours before the procedure - stop drinking milk or drinks that contain milk.  2 hours before the procedure - stop drinking clear liquids.  Medicines  Ask your health care provider  about: ? Changing or stopping your regular medicines. This is especially important if you are taking diabetes medicines or blood thinners. ? Taking medicines such as aspirin and ibuprofen. These medicines can thin your blood. Do not take these medicines before your procedure if your health care provider instructs you not to.  You may be given antibiotic medicine to help prevent infection. General instructions  Do not smoke for at least 2 weeks before your procedure. If you need help quitting, ask your health care provider.  You may have an exam or tests, such as an electrocardiogram (ECG).  You may have a blood or urine sample taken.  Ask your health care provider: ? Whether you should stop removing hair from your surgical area. ? How your surgical site will be marked or identified.  You may be asked to shower with a germ-killing soap.  Plan to have someone take you home from the hospital or clinic.  If you will be going home right after the procedure, plan to have someone with you for 24 hours. What happens during the procedure?  To reduce your risk of infection: ? Your health care team will wash or sanitize their hands. ? Hair may be removed from the surgical area. ? Your skin will be washed with soap.  An IV tube will be inserted into one of your veins.  You will be given a medicine to make you fall asleep (general anesthetic). You may also be given a medicine to help you relax (sedative).  A thin tube (catheter) may be inserted through your urethra and into your bladder to drain urine during your procedure.  Depending on the type of procedure you are having, one incision or several small incisions will be made in your abdomen.  Your fallopian tube will be cut and removed from where it attaches to your uterus.  Your blood vessels will be clamped and tied to prevent excess bleeding.  The incision(s) in your abdomen will be closed with stitches (sutures), staples, or skin  glue.  A bandage (dressing) may be placed over your incision(s). The procedure may vary among health care providers and hospitals. What happens after the procedure?  Your blood pressure, heart rate, breathing rate, and blood oxygen level will be monitored until the medicines you were given have worn off.  You may continue to receive fluids and medicines through an IV tube.  You may continue to have a catheter draining your urine.  You may have to wear compression stockings. These stockings help to prevent blood clots and reduce swelling in your legs.  You will be given pain medicine as needed.  Do not drive for 24 hours if you received a sedative. Summary  Salpingectomy is a surgical procedure to remove one of the fallopian tubes.  The procedure may be done with an open incision, with a laparoscope, or with computer-controlled instruments.  Depending on the type of procedure you are having, one incision or several small incisions will be made in your  abdomen.  Your blood pressure, heart rate, breathing rate, and blood oxygen level will be monitored until the medicines you were given have worn off.  Plan to have someone take you home from the hospital or clinic. This information is not intended to replace advice given to you by your health care provider. Make sure you discuss any questions you have with your health care provider. Document Released: 12/11/2008 Document Revised: 03/11/2016 Document Reviewed: 01/16/2013 Elsevier Interactive Patient Education  2018 [employer].  Salpingectomy, Care After This sheet gives you information about how to care for yourself after your procedure. Your health care provider may also give you more specific instructions. If you have problems or questions, contact your health care provider. What can I expect after the procedure? After your procedure, it is common to have:  Pain in your abdomen.  Some occasional vaginal bleeding  (spotting).  Tiredness.  Follow these instructions at home: Incision care   Keep your incision area and your bandage (dressing) clean and dry.  Follow instructions from your health care provider about how to take care of your incision. Make sure you: ? Wash your hands with soap and water before you change your dressing. If soap and water are not available, use hand sanitizer. ? Change your dressing astold by your health care provider. ? Leave stitches (sutures), staples, skin glue, or adhesive strips in place. These skin closures may need to stay in place for 2 weeks or longer. If adhesive strip edges start to loosen and curl up, you may trim the loose edges. Do not remove adhesive strips completely unless your health care provider tells you to do that.  Check your incision area every day for signs of infection. Check for: ? More redness, swelling, or pain. ? More fluid or blood. ? Warmth. ? Pus or a bad smell. Activity   Do not drive or use heavy machinery while taking prescription pain medicine.  Do not drive for 24 hours if you received a medicine to help you relax (sedative).  Rest as directed by your health care provider. Ask your health care provider what activities are safe for you. You should avoid: ? Lifting anything that is heavier than 10 lb (4.5 kg) until your health care provider approves. ? Activities that require a lot of energy.  Until your health care provider approves: ? Do not douche. ? Do not use tampons. ? Do not have sexual intercourse. General instructions  Take over-the-counter and prescription medicines only as told by your health care provider.  To prevent or treat constipation while you are taking prescription pain medicine, your health care provider may recommend that you: ? Drink enough fluid to keep your urine clear or pale yellow. ? Take over-the-counter or prescription medicines. ? Eat foods that are high in fiber, such as fresh fruits and  vegetables, whole grains, and beans. ? Limit foods that are high in fat and processed sugars, such as fried and sweet foods.  Do not take baths, swim, or use a hot tub until your health care provider approves. You may take showers.  Wear compression stockings as told by your health care provider. These stockings help to prevent blood clots and reduce swelling in your legs.  Keep all follow-up visits as told by your health care provider. This is important. Contact a health care provider if:  You have: ? Pain when you urinate. ? More redness, swelling, or pain around your incision. ? More fluid or blood coming from your  incision. ? Pus or a bad smell coming from your incision. ? A fever. ? Abdominal pain that gets worse or does not get better with medicine.  Your incision feels warm to the touch.  Your incision starts to break open.  You develop a rash.  You develop nausea and vomiting.  You feel light-headed. Get help right away if:  You develop pain in your chest or leg.  You develop shortness of breath.  You faint.  You have increased vaginal bleeding. This information is not intended to replace advice given to you by your health care provider. Make sure you discuss any questions you have with your health care provider. Document Released: 10/29/2010 Document Revised: 03/23/2016 Document Reviewed: 03/24/2016 Elsevier Interactive Patient Education  2018 [employer].  General Anesthesia, Adult General anesthesia is the use of medicines to make a person "go to sleep" (be unconscious) for a medical procedure. General anesthesia is often recommended when a procedure:  Is long.  Requires you to be still or in an unusual position.  Is major and can cause you to lose blood.  Is impossible to do without general anesthesia.  The medicines used for general anesthesia are called general anesthetics. In addition to making you sleep, the medicines:  Prevent pain.  Control  your blood pressure.  Relax your muscles.  Tell a health care provider about:  Any allergies you have.  All medicines you are taking, including vitamins, herbs, eye drops, creams, and over-the-counter medicines.  Any problems you or family members have had with anesthetic medicines.  Types of anesthetics you have had in the past.  Any bleeding disorders you have.  Any surgeries you have had.  Any medical conditions you have.  Any history of heart or lung conditions, such as heart failure, sleep apnea, or chronic obstructive pulmonary disease (COPD).  Whether you are pregnant or may be pregnant.  Whether you use tobacco, alcohol, marijuana, or street drugs.  Any history of Financial [employer].  Any history of depression or anxiety. What are the risks? Generally, this is a safe procedure. However, problems may occur, including:  Allergic reaction to anesthetics.  Lung and heart problems.  Inhaling food or liquids from your stomach into your lungs (aspiration).  Injury to nerves.  Waking up during your procedure and being unable to move (rare).  Extreme agitation or a state of mental confusion (delirium) when you wake up from the anesthetic.  Air in the bloodstream, which can lead to stroke.  These problems are more likely to develop if you are having a major surgery or if you have an advanced medical condition. You can prevent some of these complications by answering all of your health care provider's questions thoroughly and by following all pre-procedure instructions. General anesthesia can cause side effects, including:  Nausea or vomiting  A sore throat from the breathing tube.  Feeling cold or shivery.  Feeling tired, washed out, or achy.  Sleepiness or drowsiness.  Confusion or agitation.  What happens before the procedure? Staying hydrated Follow instructions from your health care provider about hydration, which may include:  Up to 2 hours before the  procedure - you may continue to drink clear liquids, such as water, clear fruit juice, black coffee, and plain tea.  Eating and drinking restrictions Follow instructions from your health care provider about eating and drinking, which may include:  8 hours before the procedure - stop eating heavy meals or foods such as meat, fried foods, or fatty foods.  6 hours before the procedure - stop eating light meals or foods, such as toast or cereal.  6 hours before the procedure - stop drinking milk or drinks that contain milk.  2 hours before the procedure - stop drinking clear liquids.  Medicines  Ask your health care provider about: ? Changing or stopping your regular medicines. This is especially important if you are taking diabetes medicines or blood thinners. ? Taking medicines such as aspirin and ibuprofen. These medicines can thin your blood. Do not take these medicines before your procedure if your health care provider instructs you not to. ? Taking new dietary supplements or medicines. Do not take these during the week before your procedure unless your health care provider approves them.  If you are told to take a medicine or to continue taking a medicine on the day of the procedure, take the medicine with sips of water. General instructions   Ask if you will be going home the same day, the following day, or after a longer hospital stay. ? Plan to have someone take you home. ? Plan to have someone stay with you for the first 24 hours after you leave the hospital or clinic.  For 3-6 weeks before the procedure, try not to use any tobacco products, such as cigarettes, chewing tobacco, and e-cigarettes.  You may brush your teeth on the morning of the procedure, but make sure to spit out the toothpaste. What happens during the procedure?  You will be given anesthetics through a mask and through an IV tube in one of your veins.  You may receive medicine to help you relax  (sedative).  As soon as you are asleep, a breathing tube may be used to help you breathe.  An anesthesia specialist will stay with you throughout the procedure. He or she will help keep you comfortable and safe by continuing to give you medicines and adjusting the amount of medicine that you get. He or she will also watch your blood pressure, pulse, and oxygen levels to make sure that the anesthetics do not cause any problems.  If a breathing tube was used to help you breathe, it will be removed before you wake up. The procedure may vary among health care providers and hospitals. What happens after the procedure?  You will wake up, often slowly, after the procedure is complete, usually in a recovery area.  Your blood pressure, heart rate, breathing rate, and blood oxygen level will be monitored until the medicines you were given have worn off.  You may be given medicine to help you calm down if you feel anxious or agitated.  If you will be going home the same day, your health care provider may check to make sure you can stand, drink, and urinate.  Your health care providers will treat your pain and side effects before you go home.  Do not drive for 24 hours if you received a sedative.  You may: ? Feel nauseous and vomit. ? Have a sore throat. ? Have mental slowness. ? Feel cold or shivery. ? Feel sleepy. ? Feel tired. ? Feel sore or achy, even in parts of your body where you did not have surgery. This information is not intended to replace advice given to you by your health care provider. Make sure you discuss any questions you have with your health care provider. Document Released: 11/01/2007 Document Revised: 01/05/2016 Document Reviewed: 07/09/2015 Elsevier Interactive Patient Education  2018 [employer]. General Anesthesia, Adult, Care After  These instructions provide you with information about caring for yourself after your procedure. Your health care provider may also give  you more specific instructions. Your treatment has been planned according to current medical practices, but problems sometimes occur. Call your health care provider if you have any problems or questions after your procedure. What can I expect after the procedure? After the procedure, it is common to have:  Vomiting.  A sore throat.  Mental slowness.  It is common to feel:  Nauseous.  Cold or shivery.  Sleepy.  Tired.  Sore or achy, even in parts of your body where you did not have surgery.  Follow these instructions at home: For at least 24 hours after the procedure:  Do not: ? Participate in activities where you could fall or become injured. ? Drive. ? Use heavy machinery. ? Drink alcohol. ? Take sleeping pills or medicines that cause drowsiness. ? Make important decisions or sign legal documents. ? Take care of children on your own.  Rest. Eating and drinking  If you vomit, drink water, juice, or soup when you can drink without vomiting.  Drink enough fluid to keep your urine clear or pale yellow.  Make sure you have little or no nausea before eating solid foods.  Follow the diet recommended by your health care provider. General instructions  Have a responsible adult stay with you until you are awake and alert.  Return to your normal activities as told by your health care provider. Ask your health care provider what activities are safe for you.  Take over-the-counter and prescription medicines only as told by your health care provider.  If you smoke, do not smoke without supervision.  Keep all follow-up visits as told by your health care provider. This is important. Contact a health care provider if:  You continue to have nausea or vomiting at home, and medicines are not helpful.  You cannot drink fluids or start eating again.  You cannot urinate after 8-12 hours.  You develop a skin rash.  You have fever.  You have increasing redness at the site  of your procedure. Get help right away if:  You have difficulty breathing.  You have chest pain.  You have unexpected bleeding.  You feel that you are having a life-threatening or urgent problem. This information is not intended to replace advice given to you by your health care provider. Make sure you discuss any questions you have with your health care provider. Document Released: 10/31/2000 Document Revised: 12/28/2015 Document Reviewed: 07/09/2015 Elsevier Interactive Patient Education  [employer].

## 2017-12-13 ENCOUNTER — Other Ambulatory Visit: Payer: Self-pay | Admitting: Obstetrics & Gynecology

## 2017-12-15 ENCOUNTER — Emergency Department (HOSPITAL_COMMUNITY): Payer: Medicaid Other

## 2017-12-15 ENCOUNTER — Other Ambulatory Visit: Payer: Self-pay

## 2017-12-15 ENCOUNTER — Encounter (HOSPITAL_COMMUNITY)
Admission: RE | Admit: 2017-12-15 | Discharge: 2017-12-15 | Disposition: A | Discharge status: Home / Self Care | Payer: Medicaid Other | Source: Ambulatory Visit | Attending: Obstetrics & Gynecology | Admitting: Obstetrics & Gynecology

## 2017-12-15 ENCOUNTER — Emergency Department (HOSPITAL_COMMUNITY)
Admission: EM | Admit: 2017-12-15 | Discharge: 2017-12-15 | Disposition: A | Discharge status: Home / Self Care | Payer: Medicaid Other | Attending: Emergency Medicine | Admitting: Emergency Medicine

## 2017-12-15 ENCOUNTER — Encounter (HOSPITAL_COMMUNITY): Payer: Self-pay | Admitting: Emergency Medicine

## 2017-12-15 ENCOUNTER — Encounter (HOSPITAL_COMMUNITY): Payer: Self-pay

## 2017-12-15 DIAGNOSIS — Z79899 Other long term (current) drug therapy: Secondary | ICD-10-CM | POA: Insufficient documentation

## 2017-12-15 DIAGNOSIS — I10 Essential (primary) hypertension: Secondary | ICD-10-CM | POA: Diagnosis not present

## 2017-12-15 DIAGNOSIS — R079 Chest pain, unspecified: Secondary | ICD-10-CM | POA: Diagnosis present

## 2017-12-15 DIAGNOSIS — E119 Type 2 diabetes mellitus without complications: Secondary | ICD-10-CM | POA: Diagnosis not present

## 2017-12-15 DIAGNOSIS — Z7984 Long term (current) use of oral hypoglycemic drugs: Secondary | ICD-10-CM | POA: Diagnosis not present

## 2017-12-15 DIAGNOSIS — Z01810 Encounter for preprocedural cardiovascular examination: Secondary | ICD-10-CM | POA: Diagnosis not present

## 2017-12-15 DIAGNOSIS — R000 Tachycardia, unspecified: Secondary | ICD-10-CM | POA: Insufficient documentation

## 2017-12-15 LAB — CBC WITH DIFFERENTIAL/PLATELET
Basophils Absolute: 0 10*3/uL (ref 0.0–0.1)
Basophils Relative: 0 %
Eosinophils Absolute: 0.2 10*3/uL (ref 0.0–0.7)
Eosinophils Relative: 2 %
HCT: 36.6 % (ref 36.0–46.0)
Hemoglobin: 11.5 g/dL — ABNORMAL LOW (ref 12.0–15.0)
Lymphocytes Relative: 23 %
Lymphs Abs: 2.2 10*3/uL (ref 0.7–4.0)
MCH: 24.5 pg — ABNORMAL LOW (ref 26.0–34.0)
MCHC: 31.4 g/dL (ref 30.0–36.0)
MCV: 78 fL (ref 78.0–100.0)
Monocytes Absolute: 0.4 10*3/uL (ref 0.1–1.0)
Monocytes Relative: 4 %
Neutro Abs: 6.6 10*3/uL (ref 1.7–7.7)
Neutrophils Relative %: 71 %
Platelets: 412 10*3/uL — ABNORMAL HIGH (ref 150–400)
RBC: 4.69 MIL/uL (ref 3.87–5.11)
RDW: 15.1 % (ref 11.5–15.5)
WBC: 9.4 10*3/uL (ref 4.0–10.5)

## 2017-12-15 LAB — COMPREHENSIVE METABOLIC PANEL
ALT: 19 U/L (ref 14–54)
AST: 23 U/L (ref 15–41)
Albumin: 4 g/dL (ref 3.5–5.0)
Alkaline Phosphatase: 104 U/L (ref 38–126)
Anion gap: 12 (ref 5–15)
BUN: 9 mg/dL (ref 6–20)
CO2: 25 mmol/L (ref 22–32)
Calcium: 9.6 mg/dL (ref 8.9–10.3)
Chloride: 99 mmol/L — ABNORMAL LOW (ref 101–111)
Creatinine, Ser: 1.03 mg/dL — ABNORMAL HIGH (ref 0.44–1.00)
GFR calc Af Amer: >60 mL/min (ref >60)
GFR calc non Af Amer: >60 mL/min (ref >60)
Glucose, Bld: 173 mg/dL — ABNORMAL HIGH (ref 65–99)
Potassium: 3.6 mmol/L (ref 3.5–5.1)
Sodium: 136 mmol/L (ref 135–145)
Total Bilirubin: 0.8 mg/dL (ref 0.3–1.2)
Total Protein: 8.2 g/dL — ABNORMAL HIGH (ref 6.5–8.1)

## 2017-12-15 LAB — T4, FREE: Free T4: 0.89 ng/dL (ref 0.82–1.77)

## 2017-12-15 LAB — D-DIMER, QUANTITATIVE: D-Dimer, Quant: <0.27 ug/mL-FEU (ref 0.00–0.50)

## 2017-12-15 LAB — TROPONIN I: Troponin I: <0.03 ng/mL (ref <0.03)

## 2017-12-15 LAB — TSH: TSH: 1.17 u[IU]/mL (ref 0.350–4.500)

## 2017-12-15 LAB — I-STAT BETA HCG BLOOD, ED (MC, WL, AP ONLY): I-stat hCG, quantitative: <5 m[IU]/mL (ref <5)

## 2017-12-15 MED ORDER — ONDANSETRON HCL 4 MG PO TABS
8.0000 mg | ORAL_TABLET | Freq: Once | ORAL | Status: AC
  Administered 2017-12-15: 8 mg via ORAL
  Filled 2017-12-15: qty 2

## 2017-12-15 NOTE — ED Triage Notes (Addendum)
PT went to short stay today to have a pre-op workup done to have a tubal ligation done next Wednesday. PT states she has had intermittent [facility] chest pain with high heart rate over the past couple of months.

## 2017-12-15 NOTE — Discharge Instructions (Addendum)
Follow-up with your primary doctor and consider seeing a cardiologist. Return as needed for worsening symptoms

## 2017-12-15 NOTE — ED Provider Notes (Signed)
[facility] EMERGENCY DEPARTMENT Provider Note   CSN: [id number] Arrival date & time: 12/15/17  1405     History   Chief Complaint Chief Complaint  Patient presents with  . Chest Pain    HPI Holly Pope is a 27 y.o. female.  HPI Patient presented to the emergency room for evaluation of tachycardia and intermittent chest pains.  Patient was scheduled for a tubal ligation next Wednesday.  She was at the hospital doing preop evaluation.  Patient was noted to be tachycardic.  She did mention she has had intermittent sharp chest pain over the last couple months.  Patient was sent to the emergency room for further evaluation.  Patient currently is not having any trouble with chest pain.  She is not short of breath.  She is not noticing any leg swelling.  She denies any history of heart disease.  No known history of PE. Past Medical History:  Diagnosis Date  . Constipation 08/17/2015  . Diabetes mellitus without complication (HCC)   . H/O removal of neck cyst   . Hip dislocation, right (HCC) 05/06/2012  . Hypertension   . Mental disorder   . Morbidly obese [facility])     Patient Active Problem List   Diagnosis Date Noted  . Miscarriage 10/25/2017  . History of delivery of macrosomal infant 05/10/2016  . Cesarean delivery delivered 03/15/2016  . Constipation 08/17/2015  . Diabetes (HCC)   . Hypertension   . Hip dislocation, right (HCC) 05/06/2012    Past Surgical History:  Procedure Laterality Date  . CESAREAN SECTION N/A 03/15/2016   Procedure: PRIMARY CESAREAN SECTION;  Surgeon: Lazaro Arms, MD;  Location: [facility] BIRTHING SUITES;  Service: Obstetrics;  Laterality: N/A;  . CYSTECTOMY  2002   rmoval from neck  . TOOTH EXTRACTION N/A 11/11/2013   Procedure: EXTRACT 3RD MOLARS PLUS ONE;  Surgeon: Georgia Lopes, DDS;  Location: [facility] OR;  Service: Oral Surgery;  Laterality: N/A;     OB History    Gravida  3   Para  2   Term  2   Preterm      AB  1   Living  2     SAB  1    TAB      Ectopic      Multiple  0   Live Births  2            Home Medications    Prior to Admission medications   Medication Sig Start Date End Date Taking? Authorizing Provider  benazepril (LOTENSIN) 20 MG tablet Take 20 mg by mouth daily.    Yes [provider]  empagliflozin (JARDIANCE) 10 MG TABS tablet Take 10 mg by mouth every morning.   Yes [provider]  hydrochlorothiazide (HYDRODIURIL) 25 MG tablet Take 25 mg by mouth daily.   Yes [provider]  metFORMIN (GLUCOPHAGE) 1000 MG tablet Take 1 tablet (1,000 mg total) by mouth 2 (two) times daily with a meal. 03/17/16  Yes Garth Bigness, MD    Family History Family History  Problem Relation Age of Onset  . Heart disease Mother   . Hypertension Mother   . Diabetes Mother   . Heart disease Father   . Hypertension Father   . Cancer Father        brain  . Hypertension Brother   . Diabetes Brother   . Heart disease Brother   . Asthma Daughter     Social History Social History  Tobacco Use  . Smoking status: Never Smoker  . Smokeless tobacco: Never Used  Substance Use Topics  . Alcohol use: No  . Drug use: No     Allergies   Tessalon [benzonatate]   Review of Systems Review of Systems  All other systems reviewed and are negative.    Physical Exam Updated Vital Signs BP 97/60   Pulse (!) 112   Temp 98.2 F (36.8 C) (Oral)   Resp (!) 23   Ht 1.575 m ( )   Wt (!) 139.3 kg (307 lb)   LMP 12/09/2017   SpO2 97%   BMI 56.15 kg/m   Physical Exam  Constitutional:  Non-toxic appearance. She does not appear ill. No distress.  Obese  HENT:  Head: Normocephalic and atraumatic.  Right Ear: External ear normal.  Left Ear: External ear normal.  Eyes: Conjunctivae are normal. Right eye exhibits no discharge. Left eye exhibits no discharge. No scleral icterus.  Neck: Neck supple. No tracheal deviation present.  Cardiovascular: Regular rhythm and intact  distal pulses. Tachycardia present.  Pulmonary/Chest: Effort normal and breath sounds normal. No stridor. No respiratory distress. She has no wheezes. She has no rales.  Abdominal: Soft. Bowel sounds are normal. She exhibits no distension. There is no tenderness. There is no rebound and no guarding.  Musculoskeletal: She exhibits no edema or tenderness.  Neurological: She is alert. She has normal strength. No cranial nerve deficit (no facial droop, extraocular movements intact, no slurred speech) or sensory deficit. She exhibits normal muscle tone. She displays no seizure activity. Coordination normal.  Skin: Skin is warm and dry. No rash noted.  Psychiatric: She has a normal mood and affect.  Nursing note and vitals reviewed.    ED Treatments / Results  Labs (all labs ordered are listed, but only abnormal results are displayed) Labs Reviewed  CBC WITH DIFFERENTIAL/PLATELET - Abnormal; Notable for the following components:      Result Value   Hemoglobin 11.5 (*)    MCH 24.5 (*)    Platelets 412 (*)    All other components within normal limits  COMPREHENSIVE METABOLIC PANEL - Abnormal; Notable for the following components:   Chloride 99 (*)    Glucose, Bld 173 (*)    Creatinine, Ser 1.03 (*)    Total Protein 8.2 (*)    All other components within normal limits  TROPONIN I  D-DIMER, QUANTITATIVE (NOT AT [facility])  TSH  T4, FREE  I-STAT BETA HCG BLOOD, ED ([facility], [facility], [facility] ONLY)    EKG Sinus tachycardia rate 119 Normal axis Normal intervals Normal ST-T waves Sinus tachycardia noted on prior EKGs   Radiology Dg Chest 2 View  Result Date: 12/15/2017 CLINICAL DATA:  27 year old female with intermittent central chest pain. EXAM: CHEST - 2 VIEW COMPARISON:  09/14/2017 and earlier chest radiographs. FINDINGS: Large body habitus again noted. Lung volumes are stable and within normal limits. Mediastinal contours remain normal. Visualized tracheal air column is within normal limits. No  pneumothorax, pulmonary edema, pleural effusion or confluent pulmonary opacity. No osseous abnormality identified. Negative visible bowel gas pattern. IMPRESSION: No acute cardiopulmonary abnormality. Electronically Signed   By: Odessa Fleming M.D.   On: 12/15/2017 15:31    Procedures Procedures (including critical care time)  Medications Ordered in ED Medications  ondansetron (ZOFRAN) tablet 8 mg (8 mg Oral Given 12/15/17 1505)     Initial Impression / Assessment and Plan / ED Course  I have reviewed the triage vital signs and  the nursing notes.  Pertinent labs & imaging results that were available during my care of the patient were reviewed by me and considered in my medical decision making (see chart for details).  Clinical Course as of Dec 15 1725  Fri Dec 15, 2017  1648 Initial labs normal.  Thyroid pending   [JK]  1649 CXR neg   [JK]    Clinical Course User Index [JK] Linwood Dibbles, MD    Patient's ED work-up is reassuring.  No signs to suggest acute cardiac injury.  Negative d-dimer argues against pulmonary embolism.  No evidence of anemia or severe dehydration.  Upon reviewing her prior EKGs she has had sinus tachycardia going back several years.  I doubt any acute medical issue at this time.  I think it would be reasonable for her to follow-up with a cardiologist  Final Clinical Impressions(s) / ED Diagnoses   Final diagnoses:  Sinus tachycardia    ED Discharge Orders    None       Linwood Dibbles, MD 12/15/17 1728

## 2017-12-15 NOTE — Progress Notes (Signed)
Patient presents for PAT with abnormal findings on EKG. Had originally stated that she was not in any discomfort.  When questioned further, states that she has had frequent mid sternal chest pain, SOB and nausea.  Admits that she is having chest pain 8/10 today.  Dr. Lemont Fillers reviewed ekg and suggested cardiology work up.  With symptoms presenting today, patient was taken to the emergency department for assessment.  Advised that she would have to have cardiology workup prior to surgery.  Dr Despina Hidden notified and case cancelled for 5/15.  Patient and husband verbalized understanding.  Report called to Chari Manning, RN in the ED.

## 2017-12-20 ENCOUNTER — Encounter (HOSPITAL_COMMUNITY): Admission: RE | Payer: Self-pay | Source: Ambulatory Visit

## 2017-12-20 ENCOUNTER — Ambulatory Visit (HOSPITAL_COMMUNITY)
Admission: RE | Admit: 2017-12-20 | Payer: Medicaid Other | Source: Ambulatory Visit | Admitting: Obstetrics & Gynecology

## 2017-12-20 SURGERY — LAPAROTOMY
Anesthesia: General

## 2017-12-28 ENCOUNTER — Encounter: Payer: Medicaid Other | Admitting: Obstetrics & Gynecology

## 2018-01-26 ENCOUNTER — Encounter: Payer: Self-pay | Admitting: Obstetrics & Gynecology

## 2018-01-26 ENCOUNTER — Other Ambulatory Visit: Payer: Self-pay

## 2018-01-26 ENCOUNTER — Ambulatory Visit: Payer: Medicaid Other | Admitting: Obstetrics & Gynecology

## 2018-01-26 VITALS — BP 131/86 | HR 114 | Ht 62.0 in | Wt 319.0 lb

## 2018-01-26 DIAGNOSIS — Z01818 Encounter for other preprocedural examination: Secondary | ICD-10-CM | POA: Diagnosis not present

## 2018-01-26 DIAGNOSIS — R109 Unspecified abdominal pain: Secondary | ICD-10-CM | POA: Diagnosis not present

## 2018-01-26 DIAGNOSIS — Z302 Encounter for sterilization: Secondary | ICD-10-CM

## 2018-01-26 NOTE — Progress Notes (Signed)
Preoperative History and Physical  Holly Pope is a 27 y.o. [phone] with Patient's last menstrual period was 01/19/2018. admitted for a mini laparotomy for sterilization procedure, tubal ligation.  Pt understands risk of failure of 1/300 and permanent nature of the procedure  PMH:    Past Medical History:  Diagnosis Date  . Constipation 08/17/2015  . Diabetes mellitus without complication (HCC)   . H/O removal of neck cyst   . Hip dislocation, right (HCC) 05/06/2012  . Hypertension   . Mental disorder   . Morbidly obese (HCC)     PSH:     Past Surgical History:  Procedure Laterality Date  . CESAREAN SECTION N/A 03/15/2016   Procedure: PRIMARY CESAREAN SECTION;  Surgeon: Lazaro ArmsLuther H Yosiah Jasmin, MD;  [facility];  Service: Obstetrics;  Laterality: N/A;  . CYSTECTOMY  2002   rmoval from neck  . TOOTH EXTRACTION N/A 11/11/2013   Procedure: EXTRACT 3RD MOLARS PLUS ONE;  Surgeon: Georgia LopesScott M Jensen, DDS;  Location: [facility] OR;  Service: Oral Surgery;  Laterality: N/A;    POb/GynH:      OB History    Gravida  3   Para  2   Term  2   Preterm      AB  1   Living  2     SAB  1   TAB      Ectopic      Multiple  0   Live Births  2           SH:   Social History   Tobacco Use  . Smoking status: Never Smoker  . Smokeless tobacco: Never Used  Substance Use Topics  . Alcohol use: No  . Drug use: No    FH:    Family History  Problem Relation Age of Onset  . Heart disease Holly Pope   . Hypertension Holly Pope   . Diabetes Holly Pope   . Heart disease Holly Pope   . Hypertension Holly Pope   . Cancer Holly Pope        brain  . Hypertension Holly Pope   . Diabetes Holly Pope   . Heart disease Holly Pope   . Asthma Holly Pope      Allergies:  Allergies  Allergen Reactions  . Tessalon [Benzonatate] Rash    Medications:       Current Outpatient Medications:  .  benazepril (LOTENSIN) 20 MG tablet, Take 20 mg by mouth daily. , Disp: , Rfl:  .  empagliflozin (JARDIANCE) 10 MG TABS  tablet, Take 10 mg by mouth every morning., Disp: , Rfl:  .  hydrochlorothiazide (HYDRODIURIL) 25 MG tablet, Take 25 mg by mouth daily., Disp: , Rfl:  .  LANTUS SOLOSTAR 100 UNIT/ML Solostar Pen, INJECT 20 UNITS SUBCUTANEOUSLY AT BEDTIME, Disp: , Rfl: 2 .  metFORMIN (GLUCOPHAGE) 1000 MG tablet, Take 1 tablet (1,000 mg total) by mouth 2 (two) times daily with a meal., Disp: 60 tablet, Rfl: 2  Review of Systems:   Review of Systems  Constitutional: Negative for fever, chills, weight loss, malaise/fatigue and diaphoresis.  HENT: Negative for hearing loss, ear pain, nosebleeds, congestion, sore throat, neck pain, tinnitus and ear discharge.   Eyes: Negative for blurred vision, double vision, photophobia, pain, discharge and redness.  Respiratory: Negative for cough, hemoptysis, sputum production, shortness of breath, wheezing and stridor.   Cardiovascular: Negative for chest pain, palpitations, orthopnea, claudication, leg swelling and PND.  Gastrointestinal: Positive for abdominal pain. Negative for heartburn, nausea, vomiting, diarrhea, constipation, blood in stool  and melena.  Genitourinary: Negative for dysuria, urgency, frequency, hematuria and flank pain.  Musculoskeletal: Negative for myalgias, back pain, joint pain and falls.  Skin: Negative for itching and rash.  Neurological: Negative for dizziness, tingling, tremors, sensory change, speech change, focal weakness, seizures, loss of consciousness, weakness and headaches.  Endo/Heme/Allergies: Negative for environmental allergies and polydipsia. Does not bruise/bleed easily.  Psychiatric/Behavioral: Negative for depression, suicidal ideas, hallucinations, memory loss and substance abuse. The patient is not nervous/anxious and does not have insomnia.      PHYSICAL EXAM:  Blood pressure 131/86, pulse (!) 114, height 5\' 2"  (1.575 m), weight (!) 319 lb (144.7 kg), last menstrual period 01/19/2018, not currently breastfeeding.    Vitals  reviewed. Constitutional: She is oriented to person, place, and time. She appears well-developed and well-nourished.  HENT:  Head: Normocephalic and atraumatic.  Right Ear: External ear normal.  Left Ear: External ear normal.  Nose: Nose normal.  Mouth/Throat: Oropharynx is clear and moist.  Eyes: Conjunctivae and EOM are normal. Pupils are equal, round, and reactive to light. Right eye exhibits no discharge. Left eye exhibits no discharge. No scleral icterus.  Neck: Normal range of motion. Neck supple. No tracheal deviation present. No thyromegaly present.  Cardiovascular: Normal rate, regular rhythm, normal heart sounds and intact distal pulses.  Exam reveals no gallop and no friction rub.   No murmur heard. Respiratory: Effort normal and breath sounds normal. No respiratory distress. She has no wheezes. She has no rales. She exhibits no tenderness.  GI: Soft. Bowel sounds are normal. She exhibits no distension and no mass. There is tenderness. There is no rebound and no guarding.  Genitourinary:       Vulva is normal without lesions Vagina is pink moist without discharge Cervix normal in appearance and pap is normal Uterus is normal size, contour, position, consistency, mobility, non-tender Adnexa is negative with normal sized ovaries by sonogram  Musculoskeletal: Normal range of motion. She exhibits no edema and no tenderness.  Neurological: She is alert and oriented to person, place, and time. She has normal reflexes. She displays normal reflexes. No cranial nerve deficit. She exhibits normal muscle tone. Coordination normal.  Skin: Skin is warm and dry. No rash noted. No erythema. No pallor.  Psychiatric: She has a normal mood and affect. Her behavior is normal. Judgment and thought content normal.    Labs: No results found for this or any previous visit (from the past 336 hour(s)).  EKG: Orders placed or performed during the hospital encounter of 12/15/17  . EKG    Imaging  Studies: No results found.    Assessment: Multiparous female desires permanent sterilization Central Morbid obesity making laparoscopy impossible Recnt surgery cancellation due to hypertensive issues, now on meds   Patient Active Problem List   Diagnosis Date Noted  . Miscarriage 10/25/2017  . History of delivery of macrosomal infant 05/10/2016  . Cesarean delivery delivered 03/15/2016  . Constipation 08/17/2015  . Diabetes (HCC)   . Hypertension   . Hip dislocation, right (HCC) 05/06/2012    Plan: Mini laparotomy tubal ligation  Lazaro Arms 01/26/2018 10:03 AM    Face to face time:  15 minutes  Greater than 50% of the visit time was spent in counseling and coordination of care with the patient.  The summary and outline of the counseling and care coordination is summarized in the note above.   All questions were answered.

## 2018-01-29 NOTE — Patient Instructions (Signed)
Holly BarryDanielle N Pope  01/29/2018     @[email or web address] @   Your procedure is scheduled on  02/07/2018 .  [facility] at  700  A.M.  Call this number if you have problems the morning of surgery:  [phone]   Remember:  Do not eat or drink after midnight.  You may drink clear liquids until  12 midnight 02/06/2018 .  Clear liquids allowed are:                    Water, Juice (non-citric and without pulp), Carbonated beverages, Clear Tea, Black Coffee only, Plain Jell-O only, Gatorade and Plain Popsicles only    Take these medicines the morning of surgery with A SIP OF WATER  lotensin    Do not wear jewelry, make-up or nail polish.  Do not wear lotions, powders, or perfumes, or deodorant.  Do not shave 48 hours prior to surgery.  Men may shave face and neck.  Do not bring valuables to the hospital.  [facility] belongings or [facility]uables.  Contacts, dentures or bridgework may not be worn into surgery.  Leave your suitcase in the car.  After surgery it may be brought to your room.  For patients admitted to the hospital, discharge time will be determined by your treatment team.  Patients discharged the day of surgery will not be allowed to drive home.   Name and phone number of your driver:   family Special instructions:  None  Please read over the following fact sheets that you were given. Anesthesia Post-op Instructions and Care and Recovery After Surgery       Laparoscopic Tubal Ligation Laparoscopic tubal ligation is a procedure to close the fallopian tubes. This is done so that you cannot get pregnant. When the fallopian tubes are closed, the eggs that your ovaries release cannot enter the uterus, and sperm cannot reach the released eggs. A laparoscopic tubal ligation is sometimes called "getting your tubes tied." You should not have this procedure if you want to get pregnant someday or if you are unsure about having  more children. Tell a health care provider about:  Any allergies you have.  All medicines you are taking, including vitamins, herbs, eye drops, creams, and over-the-counter medicines.  Any problems you or family members have had with anesthetic medicines.  Any blood disorders you have.  Any surgeries you have had.  Any medical conditions you have.  Whether you are pregnant or may be pregnant.  Any past pregnancies. What are the risks? Generally, this is a safe procedure. However, problems may occur, including:  Infection.  Bleeding.  Injury to surrounding organs.  Side effects from anesthetics.  Failure of the procedure.  This procedure can increase your risk of a kind of pregnancy in which a fertilized egg attaches to the outside of the uterus (ectopic pregnancy). What happens before the procedure?  Ask your health care provider about: ? Changing or stopping your regular medicines. This is especially important if you are taking diabetes medicines or blood thinners. ? Taking medicines such as aspirin and ibuprofen. These medicines can thin your blood. Do not take these medicines before your procedure if your health care provider instructs you not to.  Follow instructions from your health care provider about eating and drinking restrictions.  Plan to have someone take you home after the procedure.  If you  go home right after the procedure, plan to have someone with you for 24 hours. What happens during the procedure?  You will be given one or more of the following: ? A medicine to help you relax (sedative). ? A medicine to numb the area (local anesthetic). ? A medicine to make you fall asleep (general anesthetic). ? A medicine that is injected into an area of your body to numb everything below the injection site (regional anesthetic).  An IV tube will be inserted into one of your veins. It will be used to give you medicines and fluids during the procedure.  Your  bladder may be emptied with a small tube (catheter).  If you have been given a general anesthetic, a tube will be put down your throat to help you breathe.  Two small cuts (incisions) will be made in your lower abdomen and near your belly button.  Your abdomen will be inflated with a gas. This will let the surgeon see better and will give the surgeon room to work.  A thin, lighted tube (laparoscope) with a camera attached will be inserted into your abdomen through one of the incisions. Small instruments will be inserted through the other incision.  The fallopian tubes will be tied off, burned (cauterized), or blocked with a clip, ring, or clamp. A small portion in the center of each fallopian tube may be removed.  The gas will be released from the abdomen.  The incisions will be closed with stitches (sutures).  A bandage (dressing) will be placed over the incisions. The procedure may vary among health care providers and hospitals. What happens after the procedure?  Your blood pressure, heart rate, breathing rate, and blood oxygen level will be monitored often until the medicines you were given have worn off.  You will be given medicine to help with pain, nausea, and vomiting as needed. This information is not intended to replace advice given to you by your health care provider. Make sure you discuss any questions you have with your health care provider. Document Released: 10/31/2000 Document Revised: 12/31/2015 Document Reviewed: 07/05/2015 Elsevier Interactive Patient Education  2018 [employer].  Laparoscopic Tubal Ligation, Care After Refer to this sheet in the next few weeks. These instructions provide you with information about caring for yourself after your procedure. Your health care provider may also give you more specific instructions. Your treatment has been planned according to current medical practices, but problems sometimes occur. Call your health care provider if you have  any problems or questions after your procedure. What can I expect after the procedure? After the procedure, it is common to have:  A sore throat.  Discomfort in your shoulder.  Mild discomfort or cramping in your abdomen.  Gas pains.  Pain or soreness in the area where the surgical cut (incision) was made.  A bloated feeling.  Tiredness.  Nausea.  Vomiting.  Follow these instructions at home: Medicines  Take over-the-counter and prescription medicines only as told by your health care provider.  Do not take aspirin because it can cause bleeding.  Do not drive or operate heavy machinery while taking prescription pain medicine. Activity  Rest for the rest of the day.  Return to your normal activities as told by your health care provider. Ask your health care provider what activities are safe for you. Incision care   Follow instructions from your health care provider about how to take care of your incision. Make sure you: ? Wash your hands with  soap and water before you change your bandage (dressing). If soap and water are not available, use hand sanitizer. ? Change your dressing as told by your health care provider. ? Leave stitches (sutures) in place. They may need to stay in place for 2 weeks or longer.  Check your incision area every day for signs of infection. Check for: ? More redness, swelling, or pain. ? More fluid or blood. ? Warmth. ? Pus or a bad smell. Other Instructions  Do not take baths, swim, or use a hot tub until your health care provider approves. You may take showers.  Keep all follow-up visits as told by your health care provider. This is important.  Have someone help you with your daily household tasks for the first few days. Contact a health care provider if:  You have more redness, swelling, or pain around your incision.  Your incision feels warm to the touch.  You have pus or a bad smell coming from your incision.  The edges of your  incision break open after the sutures have been removed.  Your pain does not improve after 2-3 days.  You have a rash.  You repeatedly become dizzy or light-headed.  Your pain medicine is not helping.  You are constipated. Get help right away if:  You have a fever.  You faint.  You have increasing pain in your abdomen.  You have severe pain in one or both of your shoulders.  You have fluid or blood coming from your sutures or from your vagina.  You have shortness of breath or difficulty breathing.  You have chest pain or leg pain.  You have ongoing nausea, vomiting, or diarrhea. This information is not intended to replace advice given to you by your health care provider. Make sure you discuss any questions you have with your health care provider. Document Released: 02/11/2005 Document Revised: 12/28/2015 Document Reviewed: 07/05/2015 Elsevier Interactive Patient Education  2018 [employer]. Exploratory Laparotomy, Adult Exploratory laparotomy is a surgical procedure to examine the organs inside your belly (abdomen). Another name for this is abdominal exploration. You may have this procedure if you have abdominal pain, trauma, bleeding, infection, or obstruction. The procedure may be done if your health care provider cannot make a diagnosis from only an exam and testing. Exploratory laparotomy may be a planned procedure or an emergency procedure. You may have surgical treatment as part of the laparotomy, or you may have additional treatment after your laparotomy. This will depend on what your surgeon finds during the procedure. Tell a health care provider about:  Any allergies you have.  All medicines you are taking, including vitamins, herbs, eye drops, creams, and over-the-counter medicines.  Any problems you or family members have had with anesthetic medicines.  Any blood disorders you have.  Any surgeries you have had.  Any medical conditions you have. What are the  risks? Generally, this is a safe procedure. However, problems can occur and include:  Bleeding.  Infection.  A blood clot that forms in your leg and travels to your lungs.  Damage to organs inside your abdomen.  Scar tissue that blocks your digestive tract.  What happens before the procedure?  Ask your health care provider about: ? Changing or stopping your regular medicines. This is especially important if you are taking diabetes medicines or blood thinners. ? Taking medicines such as aspirin and ibuprofen. These medicines can thin your blood. Do not take these medicines before your procedure if your health care  provider instructs you not to.  Do not eat or drink anything after midnight on the night before the procedure or as directed by your health care provider.  You may be given instructions for clearing out your bowel before surgery (bowel prep). If you are already in the hospital, the bowel prep may be done there. What happens during the procedure?  An IV tube may be inserted into a vein. You may receive fluids and medicine through the IV tube. This may include antibiotic medicine to treat or prevent infection.  You will be given a medicine that makes you go to sleep (general anesthetic).  You may have a tube placed through your nose and into your stomach (nasogastric tube) to drain your stomach fluids.  You may have a tube placed into your bladder (urinary catheter) to drain urine.  Your abdomen will be cleaned with a germ-killing solution (antiseptic).  The surgeon will make a surgical cut (incision) in your abdomen. This is usually an up-and-down incision in the midsection of your abdomen. The incision will go through the inside lining of your abdomen (peritoneum).  Your surgeon will spread the incision wide enough to examine the inside of your abdomen.  The rest of the procedure will depend on what the surgeon finds: ? The surgeon will check all organs in your abdomen  for damage or obstruction. Repairs will be made when possible. ? If there is blood in the abdomen, the surgeon will look for the source of the bleeding in order to stop it. ? If there is yellowish-white fluid (pus) or gastric fluids in your abdomen, the surgeon will check for an infection or a hole (perforation) in your digestive tract. ? If the surgeon finds infection, a drain may be placed to empty fluid that can build up in your abdomen after surgery. ? If there is a growth (tumor) inside your abdomen, the surgeon may remove a piece of the growth (biopsy) to examine it under a microscope.  When all procedures are complete, the surgeon will close your abdomen with layers of stitches (sutures).  The incision through the skin of your abdomen will be closed with sutures or staples. What happens after the procedure?  Your blood pressure, heart rate, breathing rate, and blood oxygen level will be monitored often until the medicines you were given have worn off.  You will continue to receive fluids and nutrition through your IV tube. This will stop when you can eat and drink on your own.  You may also get antibiotic medicine and pain medicine through your IV tube.  Your nasogastric tube may be removed when you start to pass gas.  Your urinary catheter may be removed when the anesthetic wears off. This information is not intended to replace advice given to you by your health care provider. Make sure you discuss any questions you have with your health care provider. Document Released: 04/19/2001 Document Revised: 12/31/2015 Document Reviewed: 03/12/2014 Elsevier Interactive Patient Education  2018 [employer]. Exploratory Laparotomy, Adult, Care After Refer to this sheet in the next few weeks. These instructions provide you with information about caring for yourself after your procedure. Your health care provider may also give you more specific instructions. Your treatment has been planned  according to current medical practices, but problems sometimes occur. Call your health care provider if you have any problems or questions after your procedure. What can I expect after the procedure? After your procedure, it is typical to have:  Abdominal soreness.  Fatigue.  A sore throat from tubes in your throat.  A lack of appetite.  Follow these instructions at home: Medicines  Take medicines only as directed by your health care provider.  Do not drive or operate heavy machinery while taking pain medicine. Incision care  There are many different ways to close and cover an incision, including stitches (sutures), skin glue, and adhesive strips. Follow your health care provider's instructions about: ? Incision care. ? Bandage (dressing) changes and removal. ? Incision closure removal.  Do not take showers or baths until your health care provider says that you can.  Check your incision area daily for signs of infection. Watch for: ? Redness. ? Tenderness. ? Swelling. ? Drainage. Activity  Do not lift anything that is heavier than 10 pounds (4.5 kg) until your health care provider says that it is safe.  Try to walk a little bit each day if your health care provider says that it is okay.  Ask your health care provider when you can start to do your usual activities again, such as driving, going back to work, and having sex. Eating and drinking  You may eat what you usually eat. Include lots of whole grains, fruits, and vegetables in your diet. This will help to prevent constipation.  Drink enough fluid to keep your urine clear or pale yellow. General instructions  Keep all follow-up visits as directed by your health care provider. This is important. Contact a health care provider if:  You have a fever.  You have chills.  Your pain medicine is not helping.  You have constipation or diarrhea.  You have nausea or vomiting.  You have drainage, redness, swelling,  or pain at your incision site. Get help right away if:  Your pain is getting worse.  It has been more than 3 days since you been able to have a bowel movement.  You have ongoing (persistent) vomiting.  The edges of your incision open up.  You have warmth, tenderness, and swelling in your calf.  You have trouble breathing.  You have chest pain. This information is not intended to replace advice given to you by your health care provider. Make sure you discuss any questions you have with your health care provider. Document Released: 03/08/2004 Document Revised: 12/31/2015 Document Reviewed: 03/12/2014 Elsevier Interactive Patient Education  2018 [employer].  General Anesthesia, Adult General anesthesia is the use of medicines to make a person "go to sleep" (be unconscious) for a medical procedure. General anesthesia is often recommended when a procedure:  Is long.  Requires you to be still or in an unusual position.  Is major and can cause you to lose blood.  Is impossible to do without general anesthesia.  The medicines used for general anesthesia are called general anesthetics. In addition to making you sleep, the medicines:  Prevent pain.  Control your blood pressure.  Relax your muscles.  Tell a health care provider about:  Any allergies you have.  All medicines you are taking, including vitamins, herbs, eye drops, creams, and over-the-counter medicines.  Any problems you or family members have had with anesthetic medicines.  Types of anesthetics you have had in the past.  Any bleeding disorders you have.  Any surgeries you have had.  Any medical conditions you have.  Any history of heart or lung conditions, such as heart failure, sleep apnea, or chronic obstructive pulmonary disease (COPD).  Whether you are pregnant or may be pregnant.  Whether you use tobacco,  alcohol, marijuana, or street drugs.  Any history of Financial [employer].  Any history of  depression or anxiety. What are the risks? Generally, this is a safe procedure. However, problems may occur, including:  Allergic reaction to anesthetics.  Lung and heart problems.  Inhaling food or liquids from your stomach into your lungs (aspiration).  Injury to nerves.  Waking up during your procedure and being unable to move (rare).  Extreme agitation or a state of mental confusion (delirium) when you wake up from the anesthetic.  Air in the bloodstream, which can lead to stroke.  These problems are more likely to develop if you are having a major surgery or if you have an advanced medical condition. You can prevent some of these complications by answering all of your health care provider's questions thoroughly and by following all pre-procedure instructions. General anesthesia can cause side effects, including:  Nausea or vomiting  A sore throat from the breathing tube.  Feeling cold or shivery.  Feeling tired, washed out, or achy.  Sleepiness or drowsiness.  Confusion or agitation.  What happens before the procedure? Staying hydrated Follow instructions from your health care provider about hydration, which may include:  Up to 2 hours before the procedure - you may continue to drink clear liquids, such as water, clear fruit juice, black coffee, and plain tea.  Eating and drinking restrictions Follow instructions from your health care provider about eating and drinking, which may include:  8 hours before the procedure - stop eating heavy meals or foods such as meat, fried foods, or fatty foods.  6 hours before the procedure - stop eating light meals or foods, such as toast or cereal.  6 hours before the procedure - stop drinking milk or drinks that contain milk.  2 hours before the procedure - stop drinking clear liquids.  Medicines  Ask your health care provider about: ? Changing or stopping your regular medicines. This is especially important if you are  taking diabetes medicines or blood thinners. ? Taking medicines such as aspirin and ibuprofen. These medicines can thin your blood. Do not take these medicines before your procedure if your health care provider instructs you not to. ? Taking new dietary supplements or medicines. Do not take these during the week before your procedure unless your health care provider approves them.  If you are told to take a medicine or to continue taking a medicine on the day of the procedure, take the medicine with sips of water. General instructions   Ask if you will be going home the same day, the following day, or after a longer hospital stay. ? Plan to have someone take you home. ? Plan to have someone stay with you for the first 24 hours after you leave the hospital or clinic.  For 3-6 weeks before the procedure, try not to use any tobacco products, such as cigarettes, chewing tobacco, and e-cigarettes.  You may brush your teeth on the morning of the procedure, but make sure to spit out the toothpaste. What happens during the procedure?  You will be given anesthetics through a mask and through an IV tube in one of your veins.  You may receive medicine to help you relax (sedative).  As soon as you are asleep, a breathing tube may be used to help you breathe.  An anesthesia specialist will stay with you throughout the procedure. He or she will help keep you comfortable and safe by continuing to give you medicines and adjusting  the amount of medicine that you get. He or she will also watch your blood pressure, pulse, and oxygen levels to make sure that the anesthetics do not cause any problems.  If a breathing tube was used to help you breathe, it will be removed before you wake up. The procedure may vary among health care providers and hospitals. What happens after the procedure?  You will wake up, often slowly, after the procedure is complete, usually in a recovery area.  Your blood pressure,  heart rate, breathing rate, and blood oxygen level will be monitored until the medicines you were given have worn off.  You may be given medicine to help you calm down if you feel anxious or agitated.  If you will be going home the same day, your health care provider may check to make sure you can stand, drink, and urinate.  Your health care providers will treat your pain and side effects before you go home.  Do not drive for 24 hours if you received a sedative.  You may: ? Feel nauseous and vomit. ? Have a sore throat. ? Have mental slowness. ? Feel cold or shivery. ? Feel sleepy. ? Feel tired. ? Feel sore or achy, even in parts of your body where you did not have surgery. This information is not intended to replace advice given to you by your health care provider. Make sure you discuss any questions you have with your health care provider. Document Released: 11/01/2007 Document Revised: 01/05/2016 Document Reviewed: 07/09/2015 Elsevier Interactive Patient Education  2018 [employer]. General Anesthesia, Adult, Care After These instructions provide you with information about caring for yourself after your procedure. Your health care provider may also give you more specific instructions. Your treatment has been planned according to current medical practices, but problems sometimes occur. Call your health care provider if you have any problems or questions after your procedure. What can I expect after the procedure? After the procedure, it is common to have:  Vomiting.  A sore throat.  Mental slowness.  It is common to feel:  Nauseous.  Cold or shivery.  Sleepy.  Tired.  Sore or achy, even in parts of your body where you did not have surgery.  Follow these instructions at home: For at least 24 hours after the procedure:  Do not: ? Participate in activities where you could fall or become injured. ? Drive. ? Use heavy machinery. ? Drink alcohol. ? Take sleeping  pills or medicines that cause drowsiness. ? Make important decisions or sign legal documents. ? Take care of children on your own.  Rest. Eating and drinking  If you vomit, drink water, juice, or soup when you can drink without vomiting.  Drink enough fluid to keep your urine clear or pale yellow.  Make sure you have little or no nausea before eating solid foods.  Follow the diet recommended by your health care provider. General instructions  Have a responsible adult stay with you until you are awake and alert.  Return to your normal activities as told by your health care provider. Ask your health care provider what activities are safe for you.  Take over-the-counter and prescription medicines only as told by your health care provider.  If you smoke, do not smoke without supervision.  Keep all follow-up visits as told by your health care provider. This is important. Contact a health care provider if:  You continue to have nausea or vomiting at home, and medicines are not helpful.  You cannot drink fluids or start eating again.  You cannot urinate after 8-12 hours.  You develop a skin rash.  You have fever.  You have increasing redness at the site of your procedure. Get help right away if:  You have difficulty breathing.  You have chest pain.  You have unexpected bleeding.  You feel that you are having a life-threatening or urgent problem. This information is not intended to replace advice given to you by your health care provider. Make sure you discuss any questions you have with your health care provider. Document Released: 10/31/2000 Document Revised: 12/28/2015 Document Reviewed: 07/09/2015 Elsevier Interactive Patient Education  [employer].

## 2018-02-02 ENCOUNTER — Encounter (HOSPITAL_COMMUNITY): Payer: Self-pay

## 2018-02-02 ENCOUNTER — Encounter (HOSPITAL_COMMUNITY)
Admission: RE | Admit: 2018-02-02 | Discharge: 2018-02-02 | Disposition: A | Discharge status: Home / Self Care | Payer: Medicaid Other | Source: Ambulatory Visit | Attending: Obstetrics & Gynecology | Admitting: Obstetrics & Gynecology

## 2018-02-02 ENCOUNTER — Other Ambulatory Visit: Payer: Self-pay

## 2018-02-02 DIAGNOSIS — Z01812 Encounter for preprocedural laboratory examination: Secondary | ICD-10-CM | POA: Insufficient documentation

## 2018-02-02 LAB — HEMOGLOBIN A1C
Hgb A1c MFr Bld: 9.3 % — ABNORMAL HIGH (ref 4.8–5.6)
Mean Plasma Glucose: 220.21 mg/dL

## 2018-02-02 LAB — HCG, SERUM, QUALITATIVE: Preg, Serum: NEGATIVE

## 2018-02-02 LAB — GLUCOSE, CAPILLARY: Glucose-Capillary: 272 mg/dL — ABNORMAL HIGH (ref 70–99)

## 2018-02-05 NOTE — Pre-Procedure Instructions (Signed)
HgbA1C routed to PCP. 

## 2018-02-07 ENCOUNTER — Ambulatory Visit (HOSPITAL_COMMUNITY): Payer: Medicaid Other | Admitting: Anesthesiology

## 2018-02-07 ENCOUNTER — Encounter (HOSPITAL_COMMUNITY): Payer: Self-pay

## 2018-02-07 ENCOUNTER — Encounter (HOSPITAL_COMMUNITY)
Admission: RE | Disposition: A | Discharge status: Home / Self Care | Payer: Self-pay | Source: Ambulatory Visit | Attending: Obstetrics & Gynecology

## 2018-02-07 ENCOUNTER — Ambulatory Visit (HOSPITAL_COMMUNITY)
Admission: RE | Admit: 2018-02-07 | Discharge: 2018-02-07 | Disposition: A | Discharge status: Home / Self Care | Payer: Medicaid Other | Source: Ambulatory Visit | Attending: Obstetrics & Gynecology | Admitting: Obstetrics & Gynecology

## 2018-02-07 DIAGNOSIS — E1165 Type 2 diabetes mellitus with hyperglycemia: Secondary | ICD-10-CM | POA: Diagnosis not present

## 2018-02-07 DIAGNOSIS — Z79899 Other long term (current) drug therapy: Secondary | ICD-10-CM | POA: Diagnosis not present

## 2018-02-07 DIAGNOSIS — I10 Essential (primary) hypertension: Secondary | ICD-10-CM | POA: Insufficient documentation

## 2018-02-07 DIAGNOSIS — Z302 Encounter for sterilization: Secondary | ICD-10-CM

## 2018-02-07 DIAGNOSIS — Z794 Long term (current) use of insulin: Secondary | ICD-10-CM | POA: Diagnosis not present

## 2018-02-07 DIAGNOSIS — E119 Type 2 diabetes mellitus without complications: Secondary | ICD-10-CM | POA: Diagnosis not present

## 2018-02-07 HISTORY — PX: BILATERAL SALPINGECTOMY: SHX5743

## 2018-02-07 LAB — GLUCOSE, CAPILLARY
Glucose-Capillary: 205 mg/dL — ABNORMAL HIGH (ref 70–99)
Glucose-Capillary: 187 mg/dL — ABNORMAL HIGH (ref 70–99)

## 2018-02-07 SURGERY — SALPINGECTOMY, BILATERAL, OPEN
Anesthesia: General | Laterality: Bilateral

## 2018-02-07 MED ORDER — BUPIVACAINE LIPOSOME 1.3 % IJ SUSP
INTRAMUSCULAR | Status: AC
  Filled 2018-02-07: qty 20

## 2018-02-07 MED ORDER — LACTATED RINGERS IV SOLN: INTRAVENOUS | Status: DC

## 2018-02-07 MED ORDER — MEPERIDINE HCL 50 MG/ML IJ SOLN: 6.2500 mg | INTRAMUSCULAR | Status: DC | PRN

## 2018-02-07 MED ORDER — CEFAZOLIN SODIUM-DEXTROSE 1-4 GM/50ML-% IV SOLN
INTRAVENOUS | Status: AC
  Filled 2018-02-07: qty 50

## 2018-02-07 MED ORDER — LACTATED RINGERS IV SOLN
INTRAVENOUS | Status: DC
  Administered 2018-02-07: 08:00:00 via INTRAVENOUS

## 2018-02-07 MED ORDER — FENTANYL CITRATE (PF) 250 MCG/5ML IJ SOLN
INTRAMUSCULAR | Status: AC
  Filled 2018-02-07: qty 5

## 2018-02-07 MED ORDER — MIDAZOLAM HCL 2 MG/2ML IJ SOLN
INTRAMUSCULAR | Status: AC
  Filled 2018-02-07: qty 2

## 2018-02-07 MED ORDER — ONDANSETRON HCL 4 MG/2ML IJ SOLN
INTRAMUSCULAR | Status: AC
  Filled 2018-02-07: qty 2

## 2018-02-07 MED ORDER — SUCCINYLCHOLINE CHLORIDE 20 MG/ML IJ SOLN
INTRAMUSCULAR | Status: DC | PRN
  Administered 2018-02-07: 120 mg via INTRAVENOUS

## 2018-02-07 MED ORDER — HYDROCODONE-ACETAMINOPHEN 7.5-325 MG PO TABS: 1.0000 | ORAL_TABLET | Freq: Once | ORAL | Status: DC | PRN

## 2018-02-07 MED ORDER — ROCURONIUM BROMIDE 100 MG/10ML IV SOLN
INTRAVENOUS | Status: DC | PRN
  Administered 2018-02-07: 10 mg via INTRAVENOUS
  Administered 2018-02-07: 20 mg via INTRAVENOUS

## 2018-02-07 MED ORDER — SUGAMMADEX SODIUM 500 MG/5ML IV SOLN
INTRAVENOUS | Status: AC
  Filled 2018-02-07: qty 5

## 2018-02-07 MED ORDER — PHENYLEPHRINE HCL 10 MG/ML IJ SOLN
INTRAMUSCULAR | Status: DC | PRN
  Administered 2018-02-07 (×7): 40 ug via INTRAVENOUS

## 2018-02-07 MED ORDER — CEFAZOLIN SODIUM-DEXTROSE 2-4 GM/100ML-% IV SOLN
2.0000 g | INTRAVENOUS | Status: AC
  Administered 2018-02-07: 2 g via INTRAVENOUS
  Filled 2018-02-07: qty 100

## 2018-02-07 MED ORDER — BUPIVACAINE LIPOSOME 1.3 % IJ SUSP
20.0000 mL | Freq: Once | INTRAMUSCULAR | Status: DC
  Filled 2018-02-07: qty 20

## 2018-02-07 MED ORDER — KETOROLAC TROMETHAMINE 30 MG/ML IJ SOLN
30.0000 mg | Freq: Once | INTRAMUSCULAR | Status: AC
  Administered 2018-02-07: 30 mg via INTRAVENOUS
  Filled 2018-02-07: qty 1

## 2018-02-07 MED ORDER — PROPOFOL 10 MG/ML IV BOLUS
INTRAVENOUS | Status: AC
  Filled 2018-02-07: qty 40

## 2018-02-07 MED ORDER — BUPIVACAINE LIPOSOME 1.3 % IJ SUSP
INTRAMUSCULAR | Status: DC | PRN
  Administered 2018-02-07: 20 mL

## 2018-02-07 MED ORDER — ROCURONIUM BROMIDE 50 MG/5ML IV SOLN
INTRAVENOUS | Status: AC
  Filled 2018-02-07: qty 1

## 2018-02-07 MED ORDER — FENTANYL CITRATE (PF) 100 MCG/2ML IJ SOLN
INTRAMUSCULAR | Status: DC | PRN
  Administered 2018-02-07 (×2): 50 ug via INTRAVENOUS
  Administered 2018-02-07: 100 ug via INTRAVENOUS
  Administered 2018-02-07: 50 ug via INTRAVENOUS

## 2018-02-07 MED ORDER — SUGAMMADEX SODIUM 500 MG/5ML IV SOLN
INTRAVENOUS | Status: DC | PRN
  Administered 2018-02-07: 300 mg via INTRAVENOUS

## 2018-02-07 MED ORDER — ONDANSETRON HCL 4 MG/2ML IJ SOLN
INTRAMUSCULAR | Status: DC | PRN
  Administered 2018-02-07: 4 mg via INTRAVENOUS

## 2018-02-07 MED ORDER — KETOROLAC TROMETHAMINE 10 MG PO TABS: 10.0000 mg | ORAL_TABLET | Freq: Three times a day (TID) | ORAL | 0 refills | Status: DC | PRN

## 2018-02-07 MED ORDER — DEXAMETHASONE SODIUM PHOSPHATE 4 MG/ML IJ SOLN
INTRAMUSCULAR | Status: AC
  Filled 2018-02-07: qty 2

## 2018-02-07 MED ORDER — SODIUM CHLORIDE 0.9 % IR SOLN
Status: DC | PRN
  Administered 2018-02-07: 2000 mL

## 2018-02-07 MED ORDER — MIDAZOLAM HCL 2 MG/2ML IJ SOLN
INTRAMUSCULAR | Status: DC | PRN
  Administered 2018-02-07: 2 mg via INTRAVENOUS

## 2018-02-07 MED ORDER — HYDROCODONE-ACETAMINOPHEN 5-325 MG PO TABS: 1.0000 | ORAL_TABLET | Freq: Four times a day (QID) | ORAL | 0 refills | Status: DC | PRN

## 2018-02-07 MED ORDER — PROPOFOL 10 MG/ML IV BOLUS
INTRAVENOUS | Status: DC | PRN
  Administered 2018-02-07: 200 mg via INTRAVENOUS

## 2018-02-07 MED ORDER — DEXAMETHASONE SODIUM PHOSPHATE 4 MG/ML IJ SOLN
INTRAMUSCULAR | Status: DC | PRN
  Administered 2018-02-07: 8 mg via INTRAVENOUS

## 2018-02-07 MED ORDER — PROMETHAZINE HCL 25 MG/ML IJ SOLN: 6.2500 mg | INTRAMUSCULAR | Status: DC | PRN

## 2018-02-07 MED ORDER — PROMETHAZINE HCL 25 MG PO TABS: 25.0000 mg | ORAL_TABLET | Freq: Four times a day (QID) | ORAL | 1 refills | Status: DC | PRN

## 2018-02-07 MED ORDER — SUCCINYLCHOLINE CHLORIDE 20 MG/ML IJ SOLN
INTRAMUSCULAR | Status: AC
  Filled 2018-02-07: qty 1

## 2018-02-07 MED ORDER — HYDROMORPHONE HCL 1 MG/ML IJ SOLN
0.2500 mg | INTRAMUSCULAR | Status: DC | PRN
  Administered 2018-02-07: 0.5 mg via INTRAVENOUS
  Filled 2018-02-07 (×2): qty 0.5

## 2018-02-07 SURGICAL SUPPLY — 39 items
CELLS DAT CNTRL 66122 CELL SVR (MISCELLANEOUS) ×2 IMPLANT
CLOTH BEACON ORANGE TIMEOUT ST (SAFETY) ×4 IMPLANT
COVER LIGHT HANDLE STERIS (MISCELLANEOUS) ×8 IMPLANT
DRAPE WARM FLUID 44X44 (DRAPE) ×4 IMPLANT
DRSG OPSITE POSTOP 4X10 (GAUZE/BANDAGES/DRESSINGS) ×3 IMPLANT
ELECT REM PT RETURN 9FT ADLT (ELECTROSURGICAL) ×4
ELECTRODE REM PT RTRN 9FT ADLT (ELECTROSURGICAL) ×2 IMPLANT
GAUZE SPONGE 4X4 12PLY STRL (GAUZE/BANDAGES/DRESSINGS) ×8 IMPLANT
GLOVE BIOGEL PI IND STRL 6.5 (GLOVE) ×1 IMPLANT
GLOVE BIOGEL PI IND STRL 7.0 (GLOVE) ×5 IMPLANT
GLOVE BIOGEL PI IND STRL 8 (GLOVE) ×2 IMPLANT
GLOVE BIOGEL PI INDICATOR 6.5 (GLOVE) ×2
GLOVE BIOGEL PI INDICATOR 7.0 (GLOVE) ×6
GLOVE BIOGEL PI INDICATOR 8 (GLOVE) ×2
GLOVE ECLIPSE 7.0 STRL STRAW (GLOVE) ×3 IMPLANT
GLOVE ECLIPSE 8.0 STRL XLNG CF (GLOVE) ×4 IMPLANT
GLOVE SURG SS PI 6.5 STRL IVOR (GLOVE) ×3 IMPLANT
GOWN STRL REUS W/TWL LRG LVL3 (GOWN DISPOSABLE) ×8 IMPLANT
GOWN STRL REUS W/TWL XL LVL3 (GOWN DISPOSABLE) ×4 IMPLANT
HARMONIC SHEARS 14CM COAG (MISCELLANEOUS) ×3 IMPLANT
INST SET MAJOR GENERAL (KITS) ×4 IMPLANT
KIT TURNOVER KIT A (KITS) ×4 IMPLANT
MANIFOLD NEPTUNE II (INSTRUMENTS) ×4 IMPLANT
NEEDLE HYPO 22GX1.5 SAFETY (NEEDLE) ×3 IMPLANT
NS IRRIG 1000ML POUR BTL (IV SOLUTION) ×7 IMPLANT
PACK ABDOMINAL MAJOR (CUSTOM PROCEDURE TRAY) ×4 IMPLANT
PAD ARMBOARD 7.5X6 YLW CONV (MISCELLANEOUS) ×4 IMPLANT
RETRACTOR WND ALEXIS 18 MED (MISCELLANEOUS) IMPLANT
RTRCTR WOUND ALEXIS 18CM MED (MISCELLANEOUS) ×4
SET BASIN LINEN APH (SET/KITS/TRAYS/PACK) ×4 IMPLANT
STAPLER VISISTAT 35W (STAPLE) ×4 IMPLANT
SUT CHROMIC 0 CT 1 (SUTURE) ×7 IMPLANT
SUT MON AB 3-0 SH 27 (SUTURE) ×6 IMPLANT
SUT PLAIN 2 0 XLH (SUTURE) ×3 IMPLANT
SUT VIC AB 0 CT1 27 (SUTURE) ×4
SUT VIC AB 0 CT1 27XCR 8 STRN (SUTURE) ×1 IMPLANT
SUT VIC AB 0 CTX 36 (SUTURE) ×4
SUT VIC AB 0 CTX36XBRD ANTBCTR (SUTURE) ×2 IMPLANT
SYRINGE 20CC LL (MISCELLANEOUS) ×3 IMPLANT

## 2018-02-07 NOTE — Discharge Instructions (Signed)
Salpingectomy, Care After This sheet gives you information about how to care for yourself after your procedure. Your health care provider may also give you more specific instructions. If you have problems or questions, contact your health care provider. What can I expect after the procedure? After your procedure, it is common to have:  Pain in your abdomen.  Some occasional vaginal bleeding (spotting).  Tiredness.  Follow these instructions at home: Incision care   Keep your incision area and your bandage (dressing) clean and dry.  Follow instructions from your health care provider about how to take care of your incision. Make sure you: ? Wash your hands with soap and water before you change your dressing. If soap and water are not available, use hand sanitizer. ? Change your dressing astold by your health care provider. ? Leave stitches (sutures), staples, skin glue, or adhesive strips in place. These skin closures may need to stay in place for 2 weeks or longer. If adhesive strip edges start to loosen and curl up, you may trim the loose edges. Do not remove adhesive strips completely unless your health care provider tells you to do that.  Check your incision area every day for signs of infection. Check for: ? More redness, swelling, or pain. ? More fluid or blood. ? Warmth. ? Pus or a bad smell. Activity   Do not drive or use heavy machinery while taking prescription pain medicine.  Do not drive for 24 hours if you received a medicine to help you relax (sedative).  Rest as directed by your health care provider. Ask your health care provider what activities are safe for you. You should avoid: ? Lifting anything that is heavier than 10 lb (4.5 kg) until your health care provider approves. ? Activities that require a lot of energy.  Until your health care provider approves: ? Do not douche. ? Do not use tampons. ? Do not have sexual intercourse. General instructions  Take  over-the-counter and prescription medicines only as told by your health care provider.  To prevent or treat constipation while you are taking prescription pain medicine, your health care provider may recommend that you: ? Drink enough fluid to keep your urine clear or pale yellow. ? Take over-the-counter or prescription medicines. ? Eat foods that are high in fiber, such as fresh fruits and vegetables, whole grains, and beans. ? Limit foods that are high in fat and processed sugars, such as fried and sweet foods.  Do not take baths, swim, or use a hot tub until your health care provider approves. You may take showers.  Wear compression stockings as told by your health care provider. These stockings help to prevent blood clots and reduce swelling in your legs.  Keep all follow-up visits as told by your health care provider. This is important. Contact a health care provider if:  You have: ? Pain when you urinate. ? More redness, swelling, or pain around your incision. ? More fluid or blood coming from your incision. ? Pus or a bad smell coming from your incision. ? A fever. ? Abdominal pain that gets worse or does not get better with medicine.  Your incision feels warm to the touch.  Your incision starts to break open.  You develop a rash.  You develop nausea and vomiting.  You feel light-headed. Get help right away if:  You develop pain in your chest or leg.  You develop shortness of breath.  You faint.  You have increased vaginal bleeding.  This information is not intended to replace advice given to you by your health care provider. Make sure you discuss any questions you have with your health care provider. Document Released: 10/29/2010 Document Revised: 03/23/2016 Document Reviewed: 03/24/2016 Elsevier Interactive Patient Education  [employer]2018 [employer]

## 2018-02-07 NOTE — Anesthesia Preprocedure Evaluation (Signed)
Anesthesia Evaluation  Patient identified by MRN, date of birth, ID band Patient awake    Reviewed: Allergy & Precautions, H&P , NPO status , Patient's Chart, lab work & pertinent test results, reviewed documented beta blocker date and time   Airway Mallampati: III  TM Distance: >3 FB Neck ROM: full    Dental no notable dental hx. (+) Teeth Intact   Pulmonary neg pulmonary ROS,    Pulmonary exam normal  + decreased breath sounds      Cardiovascular Exercise Tolerance: Good hypertension, On Medications negative cardio ROS   Rhythm:regular Rate:Normal     Neuro/Psych PSYCHIATRIC DISORDERS negative neurological ROS  negative psych ROS   GI/Hepatic negative GI ROS, Neg liver ROS,   Endo/Other  negative endocrine ROSdiabetes, Poorly Controlled, Type obesity  Renal/GU negative Renal ROS  negative genitourinary   Musculoskeletal   Abdominal   Peds  Hematology negative hematology ROS (+)   Anesthesia Other Findings Morbid obesity On Lotensin with persistent H/O Sinus Tach  Not beta blocked  Reproductive/Obstetrics negative OB ROS                             Anesthesia Physical Anesthesia Plan  ASA: II  Anesthesia Plan: General ETT   Post-op Pain Management:    Induction:   PONV Risk Score and Plan:   Airway Management Planned:   Additional Equipment:   Intra-op Plan:   Post-operative Plan:   Informed Consent: I have reviewed the patients History and Physical, chart, labs and discussed the procedure including the risks, benefits and alternatives for the proposed anesthesia with the patient or authorized representative who has indicated his/her understanding and acceptance.   Dental Advisory Given  Plan Discussed with: CRNA and Anesthesiologist  Anesthesia Plan Comments:         Anesthesia Quick Evaluation

## 2018-02-07 NOTE — Transfer of Care (Signed)
Immediate Anesthesia Transfer of Care Note  Patient: Holly Pope  Procedure(s) Performed: MINI LAPAROTOMY (N/A ) BILATERAL TUBAL LIGATION (Bilateral )  Patient Location: PACU  Anesthesia Type:General  Level of Consciousness: awake and patient cooperative  Airway & Oxygen Therapy: Patient Spontanous Breathing and non-rebreather face mask  Post-op Assessment: Report given to RN and Post -op Vital signs reviewed and stable  Post vital signs: Reviewed and stable  Last Vitals:  Vitals Value Taken Time  BP 103/79 02/07/2018  9:00 AM  Temp    Pulse 110 02/07/2018  9:02 AM  Resp 19 02/07/2018  9:02 AM  SpO2 100 % 02/07/2018  9:02 AM  Vitals shown include unvalidated device data.  Last Pain:  Vitals:   02/07/18 0708  TempSrc: Oral  PainSc: 0-No pain         Complications: No apparent anesthesia complications

## 2018-02-07 NOTE — H&P (Signed)
Preoperative History and Physical  Holly Pope is a 27 y.o. [phone] with Patient's last menstrual period was 01/19/2018. admitted for a mini laparotomy for sterilization procedure, tubal ligation.  Pt understands risk of failure of 1/300 and permanent nature of the procedure  PMH:        Past Medical History:  Diagnosis Date  . Constipation 08/17/2015  . Diabetes mellitus without complication (HCC)   . H/O removal of neck cyst   . Hip dislocation, right (HCC) 05/06/2012  . Hypertension   . Mental disorder   . Morbidly obese (HCC)     PSH:          Past Surgical History:  Procedure Laterality Date  . CESAREAN SECTION N/A 03/15/2016   Procedure: PRIMARY CESAREAN SECTION;  Surgeon: Lazaro ArmsLuther H Aleshka Corney, MD;  [facility];  Service: Obstetrics;  Laterality: N/A;  . CYSTECTOMY  2002   rmoval from neck  . TOOTH EXTRACTION N/A 11/11/2013   Procedure: EXTRACT 3RD MOLARS PLUS ONE;  Surgeon: Georgia LopesScott M Jensen, DDS;  Location: [facility] OR;  Service: Oral Surgery;  Laterality: N/A;    POb/GynH:              OB History    Gravida  3   Para  2   Term  2   Preterm      AB  1   Living  2     SAB  1   TAB      Ectopic      Multiple  0   Live Births  2           SH:   Social History       Tobacco Use  . Smoking status: Never Smoker  . Smokeless tobacco: Never Used  Substance Use Topics  . Alcohol use: No  . Drug use: No    FH:         Family History  Problem Relation Age of Onset  . Heart disease Mother   . Hypertension Mother   . Diabetes Mother   . Heart disease Father   . Hypertension Father   . Cancer Father        brain  . Hypertension Brother   . Diabetes Brother   . Heart disease Brother   . Asthma Daughter      Allergies:      Allergies  Allergen Reactions  . Tessalon [Benzonatate] Rash    Medications:       Current Outpatient Medications:  .  benazepril (LOTENSIN) 20 MG tablet, Take 20  mg by mouth daily. , Disp: , Rfl:  .  empagliflozin (JARDIANCE) 10 MG TABS tablet, Take 10 mg by mouth every morning., Disp: , Rfl:  .  hydrochlorothiazide (HYDRODIURIL) 25 MG tablet, Take 25 mg by mouth daily., Disp: , Rfl:  .  LANTUS SOLOSTAR 100 UNIT/ML Solostar Pen, INJECT 20 UNITS SUBCUTANEOUSLY AT BEDTIME, Disp: , Rfl: 2 .  metFORMIN (GLUCOPHAGE) 1000 MG tablet, Take 1 tablet (1,000 mg total) by mouth 2 (two) times daily with a meal., Disp: 60 tablet, Rfl: 2  Review of Systems:   Review of Systems  Constitutional: Negative for fever, chills, weight loss, malaise/fatigue and diaphoresis.  HENT: Negative for hearing loss, ear pain, nosebleeds, congestion, sore throat, neck pain, tinnitus and ear discharge.   Eyes: Negative for blurred vision, double vision, photophobia, pain, discharge and redness.  Respiratory: Negative for cough, hemoptysis, sputum production, shortness of breath, wheezing and stridor.  Cardiovascular: Negative for chest pain, palpitations, orthopnea, claudication, leg swelling and PND.  Gastrointestinal: Positive for abdominal pain. Negative for heartburn, nausea, vomiting, diarrhea, constipation, blood in stool and melena.  Genitourinary: Negative for dysuria, urgency, frequency, hematuria and flank pain.  Musculoskeletal: Negative for myalgias, back pain, joint pain and falls.  Skin: Negative for itching and rash.  Neurological: Negative for dizziness, tingling, tremors, sensory change, speech change, focal weakness, seizures, loss of consciousness, weakness and headaches.  Endo/Heme/Allergies: Negative for environmental allergies and polydipsia. Does not bruise/bleed easily.  Psychiatric/Behavioral: Negative for depression, suicidal ideas, hallucinations, memory loss and substance abuse. The patient is not nervous/anxious and does not have insomnia.      PHYSICAL EXAM:  Blood pressure 131/86, pulse (!) 114, height 5\' 2"  (1.575 m), weight (!) 319 lb (144.7  kg), last menstrual period 01/19/2018, not currently breastfeeding.    Vitals reviewed. Constitutional: She is oriented to person, place, and time. She appears well-developed and well-nourished.  HENT:  Head: Normocephalic and atraumatic.  Right Ear: External ear normal.  Left Ear: External ear normal.  Nose: Nose normal.  Mouth/Throat: Oropharynx is clear and moist.  Eyes: Conjunctivae and EOM are normal. Pupils are equal, round, and reactive to light. Right eye exhibits no discharge. Left eye exhibits no discharge. No scleral icterus.  Neck: Normal range of motion. Neck supple. No tracheal deviation present. No thyromegaly present.  Cardiovascular: Normal rate, regular rhythm, normal heart sounds and intact distal pulses.  Exam reveals no gallop and no friction rub.   No murmur heard. Respiratory: Effort normal and breath sounds normal. No respiratory distress. She has no wheezes. She has no rales. She exhibits no tenderness.  GI: Soft. Bowel sounds are normal. She exhibits no distension and no mass. There is tenderness. There is no rebound and no guarding.  Genitourinary:       Vulva is normal without lesions Vagina is pink moist without discharge Cervix normal in appearance and pap is normal Uterus is normal size, contour, position, consistency, mobility, non-tender Adnexa is negative with normal sized ovaries by sonogram  Musculoskeletal: Normal range of motion. She exhibits no edema and no tenderness.  Neurological: She is alert and oriented to person, place, and time. She has normal reflexes. She displays normal reflexes. No cranial nerve deficit. She exhibits normal muscle tone. Coordination normal.  Skin: Skin is warm and dry. No rash noted. No erythema. No pallor.  Psychiatric: She has a normal mood and affect. Her behavior is normal. Judgment and thought content normal.    Labs: No results found for this or any previous visit (from the past 336 hour(s)).  EKG:     Orders placed or performed during the hospital encounter of 12/15/17  . EKG    Imaging Studies: ImagingResults  No results found.      Assessment: Multiparous female desires permanent sterilization Central Morbid obesity making laparoscopy impossible Recnt surgery cancellation due to hypertensive issues, now on meds       Patient Active Problem List   Diagnosis Date Noted  . Miscarriage 10/25/2017  . History of delivery of macrosomal infant 05/10/2016  . Cesarean delivery delivered 03/15/2016  . Constipation 08/17/2015  . Diabetes (HCC)   . Hypertension   . Hip dislocation, right (HCC) 05/06/2012    Plan: Mini laparotomy tubal ligation  Lazaro Arms 01/26/2018 10:03 AM

## 2018-02-07 NOTE — Anesthesia Procedure Notes (Signed)
Procedure Name: Intubation Date/Time: 02/07/2018 7:48 AM Performed by: Vista Deck, CRNA Pre-anesthesia Checklist: Patient identified, Patient being monitored, Timeout performed, Emergency Drugs available and Suction available Patient Re-evaluated:Patient Re-evaluated prior to induction Oxygen Delivery Method: Circle System Utilized Preoxygenation: Pre-oxygenation with 100% oxygen Induction Type: IV induction Ventilation: Mask ventilation without difficulty Laryngoscope Size: Mac and 3 Grade View: Grade II Tube type: Oral Tube size: 7.0 mm Number of attempts: 1 Airway Equipment and Method: stylet and Oral airway Placement Confirmation: ETT inserted through vocal cords under direct vision,  positive ETCO2 and breath sounds checked- equal and bilateral Secured at: 21 cm Tube secured with: Tape Dental Injury: Teeth and Oropharynx as per pre-operative assessment

## 2018-02-07 NOTE — Anesthesia Postprocedure Evaluation (Signed)
Anesthesia Post Note  Patient: Holly Pope  Procedure(s) Performed: BILATERAL PARTIAL SALPINGECTOMY FOR STERILIZATION TUBAL LIGATION (Bilateral )  Patient location during evaluation: Short Stay Anesthesia Type: General Level of consciousness: awake and alert and patient cooperative Pain management: satisfactory to patient Vital Signs Assessment: post-procedure vital signs reviewed and stable Respiratory status: spontaneous breathing Cardiovascular status: stable Postop Assessment: no apparent nausea or vomiting Anesthetic complications: no     Last Vitals:  Vitals:   02/07/18 0945 02/07/18 0948  BP: 106/73 106/73  Pulse: (!) 112 (!) 115  Resp: (!) 21 (!) 22  Temp:  37.4 C  SpO2: 90% 91%    Last Pain:  Vitals:   02/07/18 0948  TempSrc:   PainSc: 2                  Yoltzin Ransom

## 2018-02-07 NOTE — Op Note (Signed)
Preoperative diagnosis: Multiparous female desires permanent sterilization                                         Morbid obesity with dramatically large pannus                                          Suboptimal controlled type 2 diabetes                                           Hypertension  Postop diagnosis: Same as above  Procedure: Mini laparotomy bilateral tubal ligation, specifically partial salpingectomy for sterilization  Surgeon:  Lazaro ArmsLuther H Eure, MD   Anesthesia: General endotracheal  Findings: Patient is not a candidate for laparoscopic tubal ligation because of her large pannus As result we schedule her for a mini laparotomy and she was aware I was going to perform the type of tubal ligation that was in her best interest depending on the anatomy Her uterus tubes and ovaries are normal but her right fallopian tube was adherent distally  Description of operation: Patient was taken to the operating room placed in supine position where she underwent general tracheal anesthesia She was prepped and draped in usual sterile fashion A Pfannenstiel minilaparotomy incision was made 8 cm in length The incision was carried down to the rectus fascia which was scored in midline extended laterally Fascia was taken off the muscle superiorly and inferior without difficulty The muscles were divided and the peritoneal cavity was entered A small Alexis retractor was placed The patient was placed in Trendelenburg position the upper abdomen was packed away Attention was turned to the right fallopian tube which was identified including the fimbriated end I then decided the best mode of tubal ligation at this point was used a harmonic scalpel Monina scalpel was employed in a partial salpingectomy was performed removing the distal isthmic and ampullary region of the tube for approximately 2-1/2 cm segment bilaterally This is essentially like doing a modified Pomeroy except just using the harmonic  scalpel instead of suturing a loop of tube The left fallopian tube was also identified and the harmonic scalpel was employed Approximately 2-1/2 cm segment of the left fallopian tube was also removed using the harmonic scalpel in the distal isthmic and ampullary region of the tube There was good hemostasis of both fallopian tube pedicles  Tubal segments were sent to pathology for evaluation Again there were no other pelvic abnormalities noted  The muscles and peritoneum were reapproximated loosely The fascia was closed using 0 Vicryl running The subcutaneous tissue was reapproximated loosely using 0 plain  20 cc of Exparel or 266 mg was injected in subtenons tissue for postoperative pain management  Skin staples were placed Honeycomb dressing was placed  Patient was awakened from anesthesia taken recovery in good stable condition all counts were correct x3  Received Ancef and Toradol preoperatively prophylactically  Estimated blood loss for the procedure was less than 50 cc  Lazaro ArmsLuther H Eure, MD   02/07/2018 9:00 AM

## 2018-02-07 NOTE — Interval H&P Note (Signed)
History and Physical Interval Note:  02/07/2018 7:10 AM  Holly Pope  has presented today for surgery, with the diagnosis of Sterilization  The various methods of treatment have been discussed with the patient and family. After consideration of risks, benefits and other options for treatment, the patient has consented to  Procedure(s): MINI LAPAROTOMY (N/A) LAPAROSCOPIC TUBAL LIGATION (Bilateral) as a surgical intervention .  The patient's history has been reviewed, patient examined, no change in status, stable for surgery.  I have reviewed the patient's chart and labs.  Questions were answered to the patient's satisfaction.     Lazaro ArmsLuther H Jahzaria Vary

## 2018-02-09 ENCOUNTER — Encounter (HOSPITAL_COMMUNITY): Payer: Self-pay | Admitting: Obstetrics & Gynecology

## 2018-02-15 ENCOUNTER — Encounter: Payer: Medicaid Other | Admitting: Obstetrics & Gynecology

## 2018-02-20 ENCOUNTER — Other Ambulatory Visit: Payer: Self-pay

## 2018-02-20 ENCOUNTER — Encounter: Payer: Self-pay | Admitting: Obstetrics & Gynecology

## 2018-02-20 ENCOUNTER — Ambulatory Visit (INDEPENDENT_AMBULATORY_CARE_PROVIDER_SITE_OTHER): Payer: Medicaid Other | Admitting: Obstetrics & Gynecology

## 2018-02-20 VITALS — BP 125/72 | HR 109 | Ht 62.0 in | Wt 320.0 lb

## 2018-02-20 DIAGNOSIS — Z9851 Tubal ligation status: Secondary | ICD-10-CM

## 2018-02-20 NOTE — Progress Notes (Signed)
  HPI: Patient returns for routine postoperative follow-up having undergone laparoscopic bilateral tubal ligation on 02/07/2018.  The patient's immediate postoperative recovery has been unremarkable. Since hospital discharge the patient reports no problems.   Current Outpatient Medications: benazepril (LOTENSIN) 20 MG tablet, Take 20 mg by mouth daily. , Disp: , Rfl:  empagliflozin (JARDIANCE) 10 MG TABS tablet, Take 10 mg by mouth every morning., Disp: , Rfl:  hydrochlorothiazide (HYDRODIURIL) 25 MG tablet, Take 25 mg by mouth daily., Disp: , Rfl:  HYDROcodone-acetaminophen (NORCO/VICODIN) 5-325 MG tablet, Take 1 tablet by mouth every 6 (six) hours as needed., Disp: 30 tablet, Rfl: 0 ketorolac (TORADOL) 10 MG tablet, Take 1 tablet (10 mg total) by mouth every 8 (eight) hours as needed., Disp: 15 tablet, Rfl: 0 LANTUS SOLOSTAR 100 UNIT/ML Solostar Pen, INJECT 20 UNITS SUBCUTANEOUSLY AT BEDTIME, Disp: , Rfl: 2 metFORMIN (GLUCOPHAGE) 1000 MG tablet, Take 1 tablet (1,000 mg total) by mouth 2 (two) times daily with a meal., Disp: 60 tablet, Rfl: 2 promethazine (PHENERGAN) 25 MG tablet, Take 1 tablet (25 mg total) by mouth every 6 (six) hours as needed for nausea. (Patient not taking: Reported on 02/20/2018), Disp: 30 tablet, Rfl: 1  No current facility-administered medications for this visit.     Blood pressure 125/72, pulse (!) 109, height 5\' 2"  (1.575 m), weight (!) 320 lb (145.2 kg), last menstrual period 02/05/2018, not currently breastfeeding.  Physical Exam: Incision clean dry peri incisional yeast moisture changes  Diagnostic Tests:   Pathology: Benign tubes  Impression: S/p laparoscopic bilateral tubal ligation, thru a mini lap incision  Plan: Routine post op  Gentian violet placed  Follow up: 2  weeks  Lazaro ArmsLuther H Alysah Carton, MD

## 2018-03-06 ENCOUNTER — Encounter: Payer: Self-pay | Admitting: Obstetrics & Gynecology

## 2018-03-06 ENCOUNTER — Ambulatory Visit (INDEPENDENT_AMBULATORY_CARE_PROVIDER_SITE_OTHER): Payer: Medicaid Other | Admitting: Obstetrics & Gynecology

## 2018-03-06 VITALS — BP 126/78 | HR 125 | Ht 62.0 in | Wt 315.0 lb

## 2018-03-06 DIAGNOSIS — Z9851 Tubal ligation status: Secondary | ICD-10-CM

## 2018-03-06 NOTE — Progress Notes (Signed)
  HPI: Patient returns for routine postoperative follow-up having undergone laparotomy tubal ligation on .  The patient's immediate postoperative recovery has been unremarkable. Since hospital discharge the patient reports no problems.   Current Outpatient Medications: benazepril (LOTENSIN) 20 MG tablet, Take 20 mg by mouth daily. , Disp: , Rfl:  empagliflozin (JARDIANCE) 10 MG TABS tablet, Take 10 mg by mouth every morning., Disp: , Rfl:  hydrochlorothiazide (HYDRODIURIL) 25 MG tablet, Take 25 mg by mouth daily., Disp: , Rfl:  HYDROcodone-acetaminophen (NORCO/VICODIN) 5-325 MG tablet, Take 1 tablet by mouth every 6 (six) hours as needed., Disp: 30 tablet, Rfl: 0 ketorolac (TORADOL) 10 MG tablet, Take 1 tablet (10 mg total) by mouth every 8 (eight) hours as needed., Disp: 15 tablet, Rfl: 0 LANTUS SOLOSTAR 100 UNIT/ML Solostar Pen, INJECT 20 UNITS SUBCUTANEOUSLY AT BEDTIME, Disp: , Rfl: 2 metFORMIN (GLUCOPHAGE) 1000 MG tablet, Take 1 tablet (1,000 mg total) by mouth 2 (two) times daily with a meal., Disp: 60 tablet, Rfl: 2 promethazine (PHENERGAN) 25 MG tablet, Take 1 tablet (25 mg total) by mouth every 6 (six) hours as needed for nausea., Disp: 30 tablet, Rfl: 1  No current facility-administered medications for this visit.     Blood pressure 126/78, pulse (!) 125, height 5\' 2"  (1.575 m), weight (!) 315 lb (142.9 kg), last menstrual period 02/05/2018, not currently breastfeeding.  Physical Exam: Incision is healing well GV placed  Diagnostic Tests:   Pathology:   Impression: S/p laparotomy for tubal ligation  Plan:   Follow up: 1  months  Lazaro ArmsLuther H Eure, MD

## 2018-03-30 ENCOUNTER — Ambulatory Visit (INDEPENDENT_AMBULATORY_CARE_PROVIDER_SITE_OTHER): Payer: Medicaid Other | Admitting: Obstetrics & Gynecology

## 2018-03-30 ENCOUNTER — Encounter: Payer: Self-pay | Admitting: Obstetrics & Gynecology

## 2018-03-30 VITALS — BP 130/93 | HR 116 | Ht 62.0 in | Wt 303.0 lb

## 2018-03-30 DIAGNOSIS — Z9851 Tubal ligation status: Secondary | ICD-10-CM

## 2018-03-30 NOTE — Progress Notes (Signed)
  HPI: Patient returns for routine postoperative follow-up having undergone laparotomy with tubal ligation on 02/07/2018.  The patient's immediate postoperative recovery has been unremarkable. Since hospital discharge the patient reports no problems.   Current Outpatient Medications: benazepril (LOTENSIN) 20 MG tablet, Take 20 mg by mouth daily. , Disp: , Rfl:  empagliflozin (JARDIANCE) 10 MG TABS tablet, Take 10 mg by mouth every morning., Disp: , Rfl:  hydrochlorothiazide (HYDRODIURIL) 25 MG tablet, Take 25 mg by mouth daily., Disp: , Rfl:  HYDROcodone-acetaminophen (NORCO/VICODIN) 5-325 MG tablet, Take 1 tablet by mouth every 6 (six) hours as needed., Disp: 30 tablet, Rfl: 0 ketorolac (TORADOL) 10 MG tablet, Take 1 tablet (10 mg total) by mouth every 8 (eight) hours as needed., Disp: 15 tablet, Rfl: 0 LANTUS SOLOSTAR 100 UNIT/ML Solostar Pen, INJECT 20 UNITS SUBCUTANEOUSLY AT BEDTIME, Disp: , Rfl: 2 metFORMIN (GLUCOPHAGE) 1000 MG tablet, Take 1 tablet (1,000 mg total) by mouth 2 (two) times daily with a meal., Disp: 60 tablet, Rfl: 2 promethazine (PHENERGAN) 25 MG tablet, Take 1 tablet (25 mg total) by mouth every 6 (six) hours as needed for nausea., Disp: 30 tablet, Rfl: 1  No current facility-administered medications for this visit.     There were no vitals taken for this visit.  Physical Exam: Incision clean dry intact, resolved yeast   Diagnostic Tests:   Pathology: Benign + tubal segments  Impression: S/p laparotomy with BTL  Plan:   Follow up: 1  years  Lazaro ArmsLuther H Eulis Salazar, MD

## 2018-10-06 ENCOUNTER — Other Ambulatory Visit: Payer: Self-pay

## 2018-10-06 ENCOUNTER — Emergency Department (HOSPITAL_COMMUNITY)
Admission: EM | Admit: 2018-10-06 | Discharge: 2018-10-06 | Disposition: A | Discharge status: Home / Self Care | Payer: Medicaid Other | Attending: Emergency Medicine | Admitting: Emergency Medicine

## 2018-10-06 ENCOUNTER — Encounter (HOSPITAL_COMMUNITY): Payer: Self-pay | Admitting: Emergency Medicine

## 2018-10-06 ENCOUNTER — Emergency Department (HOSPITAL_COMMUNITY): Payer: Medicaid Other

## 2018-10-06 DIAGNOSIS — M25531 Pain in right wrist: Secondary | ICD-10-CM

## 2018-10-06 DIAGNOSIS — Z79899 Other long term (current) drug therapy: Secondary | ICD-10-CM | POA: Insufficient documentation

## 2018-10-06 DIAGNOSIS — I10 Essential (primary) hypertension: Secondary | ICD-10-CM | POA: Insufficient documentation

## 2018-10-06 DIAGNOSIS — E119 Type 2 diabetes mellitus without complications: Secondary | ICD-10-CM | POA: Diagnosis not present

## 2018-10-06 DIAGNOSIS — Z7984 Long term (current) use of oral hypoglycemic drugs: Secondary | ICD-10-CM | POA: Diagnosis not present

## 2018-10-06 LAB — POC URINE PREG, ED: Preg Test, Ur: NEGATIVE

## 2018-10-06 MED ORDER — NAPROXEN 500 MG PO TABS: 500.0000 mg | ORAL_TABLET | Freq: Two times a day (BID) | ORAL | 0 refills | Status: AC

## 2018-10-06 NOTE — ED Triage Notes (Signed)
Patient c/o right wrist pain x2 weeks with no known injury. Patient states pain radiates into fingers and elbow. Denies taking anything for pain.

## 2018-10-06 NOTE — Discharge Instructions (Addendum)
Your xrays are normal but your exam and symptoms suggest possible carpal tunnel syndrome.  Read the attached information and avoid activity that worsens your pain.  Wear the splint to rest your wrist, especially overnight while sleeping.

## 2018-10-06 NOTE — ED Notes (Signed)
Patient used the bathroom prior to coming back and states she is unable to give sample at this time. Patient informed to let staff know when she is able to give urine sample. Patient verbalizes understanding.

## 2018-10-06 NOTE — ED Provider Notes (Signed)
[facility] EMERGENCY DEPARTMENT Provider Note   CSN: [id number] Arrival date & time: 10/06/18  1711    History   Chief Complaint Chief Complaint  Patient presents with  . Wrist Injury    HPI Holly Pope is a 28 y.o. female, right-handed, presenting with a 2-week history of right wrist pain of unclear etiology, denies any injuries.  She also denies chronic overuse, not currently working.  She describes a throbbing/burning pain in her right volar wrist with occasional radiation into her medial hand and up to her elbow which is worsened with certain movements, especially extension and flexion.  She has had no treatment for this symptom prior to arrival.  She denies weakness in the hand, no edema, rash, erythema.  Past medical history is significant for diabetes, hypertension.  She has had no fevers or chills and no other complaints.     The history is provided by the patient.    Past Medical History:  Diagnosis Date  . Constipation 08/17/2015  . Diabetes mellitus without complication (HCC)   . H/O removal of neck cyst   . Hip dislocation, right (HCC) 05/06/2012  . Hypertension   . Mental disorder   . Morbidly obese [facility])     Patient Active Problem List   Diagnosis Date Noted  . Request for sterilization   . Miscarriage 10/25/2017  . History of delivery of macrosomal infant 05/10/2016  . Cesarean delivery delivered 03/15/2016  . Constipation 08/17/2015  . Diabetes (HCC)   . Hypertension   . Hip dislocation, right (HCC) 05/06/2012    Past Surgical History:  Procedure Laterality Date  . BILATERAL SALPINGECTOMY Bilateral 02/07/2018   Procedure: BILATERAL PARTIAL SALPINGECTOMY FOR STERILIZATION TUBAL LIGATION;  Surgeon: Lazaro Arms, MD;  Location: [facility] ORS;  Service: Gynecology;  Laterality: Bilateral;  . CESAREAN SECTION N/A 03/15/2016   Procedure: PRIMARY CESAREAN SECTION;  Surgeon: Lazaro Arms, MD;  Location: [facility] BIRTHING SUITES;  Service: Obstetrics;  Laterality: N/A;   . CYSTECTOMY  2002   rmoval from neck  . TOOTH EXTRACTION N/A 11/11/2013   Procedure: EXTRACT 3RD MOLARS PLUS ONE;  Surgeon: Georgia Lopes, DDS;  Location: [facility] OR;  Service: Oral Surgery;  Laterality: N/A;  . TUBAL LIGATION       OB History    Gravida  3   Para  2   Term  2   Preterm      AB  1   Living  2     SAB  1   TAB      Ectopic      Multiple  0   Live Births  2            Home Medications    Prior to Admission medications   Medication Sig Start Date End Date Taking? Authorizing Provider  benazepril (LOTENSIN) 20 MG tablet Take 20 mg by mouth daily.     [provider]  empagliflozin (JARDIANCE) 10 MG TABS tablet Take 10 mg by mouth every morning.    [provider]  hydrochlorothiazide (HYDRODIURIL) 25 MG tablet Take 25 mg by mouth daily.    [provider]  LANTUS SOLOSTAR 100 UNIT/ML Solostar Pen INJECT 20 UNITS SUBCUTANEOUSLY AT BEDTIME 01/04/18   [provider]  metFORMIN (GLUCOPHAGE) 1000 MG tablet Take 1 tablet (1,000 mg total) by mouth 2 (two) times daily with a meal. 03/17/16   Garth Bigness, MD  naproxen (NAPROSYN) 500 MG tablet Take 1 tablet (500 mg  total) by mouth 2 (two) times daily. 10/06/18   Burgess AmorIdol, Domnic Vantol, PA-C    Family History Family History  Problem Relation Age of Onset  . Heart disease Mother   . Hypertension Mother   . Diabetes Mother   . Heart disease Father   . Hypertension Father   . Cancer Father        brain  . Hypertension Brother   . Diabetes Brother   . Heart disease Brother   . Asthma Daughter     Social History Social History   Tobacco Use  . Smoking status: Never Smoker  . Smokeless tobacco: Never Used  Substance Use Topics  . Alcohol use: No  . Drug use: No     Allergies   Tessalon [benzonatate]   Review of Systems Review of Systems  Constitutional: Negative.  Negative for fever.  HENT: Negative.   Musculoskeletal: Positive for arthralgias. Negative for  joint swelling and myalgias.  Skin: Negative.   Neurological: Negative for weakness and numbness.     Physical Exam Updated Vital Signs BP 133/86 (BP Location: Left Arm)   Pulse (!) 114   Temp 98.1 F (36.7 C) (Oral)   Resp 18   Ht 5\' 2"  (1.575 m)   Wt (!) 139.7 kg   LMP 09/12/2018   SpO2 96%   BMI 56.33 kg/m   Physical Exam Constitutional:      Appearance: She is well-developed.  HENT:     Head: Atraumatic.  Neck:     Musculoskeletal: Normal range of motion.  Cardiovascular:     Pulses:          Radial pulses are 2+ on the right side and 2+ on the left side.     Comments: Pulses equal bilaterally Musculoskeletal:        General: Tenderness present.     Right wrist: She exhibits tenderness. She exhibits no swelling, no effusion, no crepitus and no deformity.     Comments: Positive Tinel's and Phalen's sign right wrist.  Skin:    General: Skin is warm and dry.     Findings: No lesion or rash.  Neurological:     Mental Status: She is alert.     Sensory: Sensation is intact. No sensory deficit.     Motor: Motor function is intact.     Deep Tendon Reflexes: Reflexes normal.  Psychiatric:        Mood and Affect: Mood is anxious.      ED Treatments / Results  Labs (all labs ordered are listed, but only abnormal results are displayed) Labs Reviewed  POC URINE PREG, ED    EKG None  Radiology Dg Wrist Complete Right  Result Date: 10/06/2018 CLINICAL DATA:  Right wrist pain for 2 weeks. EXAM: RIGHT WRIST - COMPLETE 3+ VIEW COMPARISON:  None FINDINGS: There is no evidence of fracture or dislocation. There is no evidence of arthropathy or other focal bone abnormality. Soft tissues are unremarkable. IMPRESSION: Negative. Electronically Signed   By: Signa Kellaylor  Stroud M.D.   On: 10/06/2018 19:00    Procedures Procedures (including critical care time)  Medications Ordered in ED Medications - No data to display   Initial Impression / Assessment and Plan / ED Course   I have reviewed the triage vital signs and the nursing notes.  Pertinent labs & imaging results that were available during my care of the patient were reviewed by me and considered in my medical decision making (see chart for details).  Patient with right wrist pain of unclear etiology, no injury or overuse, although history and exam does suggest possible carpal tunnel syndrome.  She is a diabetic which is a risk factor for this condition.  She has no significant findings on her exam but is positive for both Phalen's and Tinel signs.  She was placed in a Velcro wrist splint, discussed home treatment including ice, rest.  She was prescribed anti-inflammatories.  She has a follow-up appointment with her PCP on March 9.  Encouraged to have him recheck her symptoms at this time.  PRN follow-up anticipated.  Final Clinical Impressions(s) / ED Diagnoses   Final diagnoses:  Right wrist pain    ED Discharge Orders         Ordered    naproxen (NAPROSYN) 500 MG tablet  2 times daily     10/06/18 1913           Victoriano Lain 10/06/18 1934    Loren Racer, MD 10/07/18 [phone]

## 2018-10-06 NOTE — ED Notes (Signed)
Patient verbally informed writer her LMP was middle week of beginning of February. PA made aware.

## 2020-10-03 IMAGING — DX DG WRIST COMPLETE 3+V*R*
4 series · 4 of 4 positions shown · non-contrast
Comparison: None

CLINICAL DATA: Right wrist pain for 2 weeks.

EXAM:
RIGHT WRIST - COMPLETE 3+ VIEW

[wrist pa]
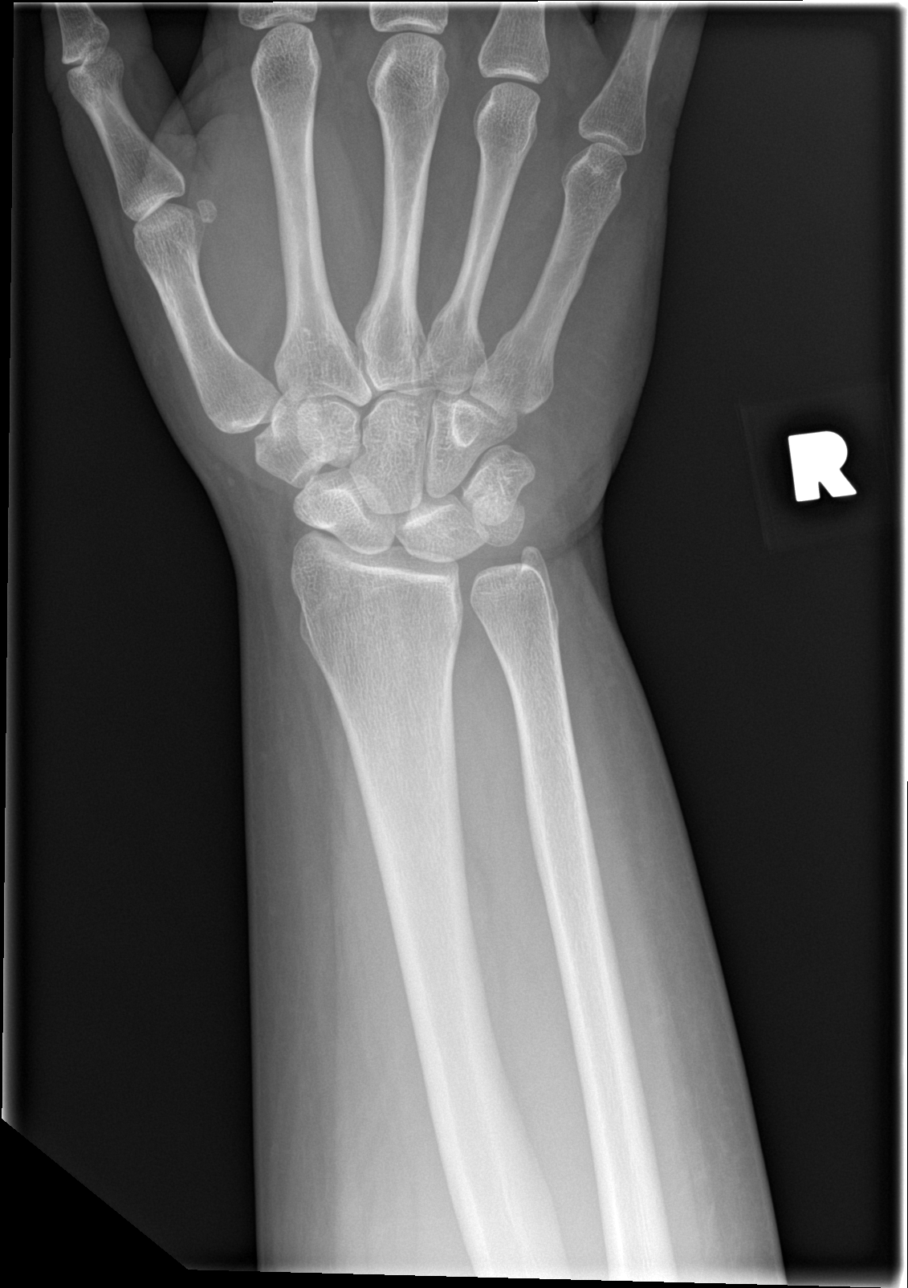

[wrist obl]
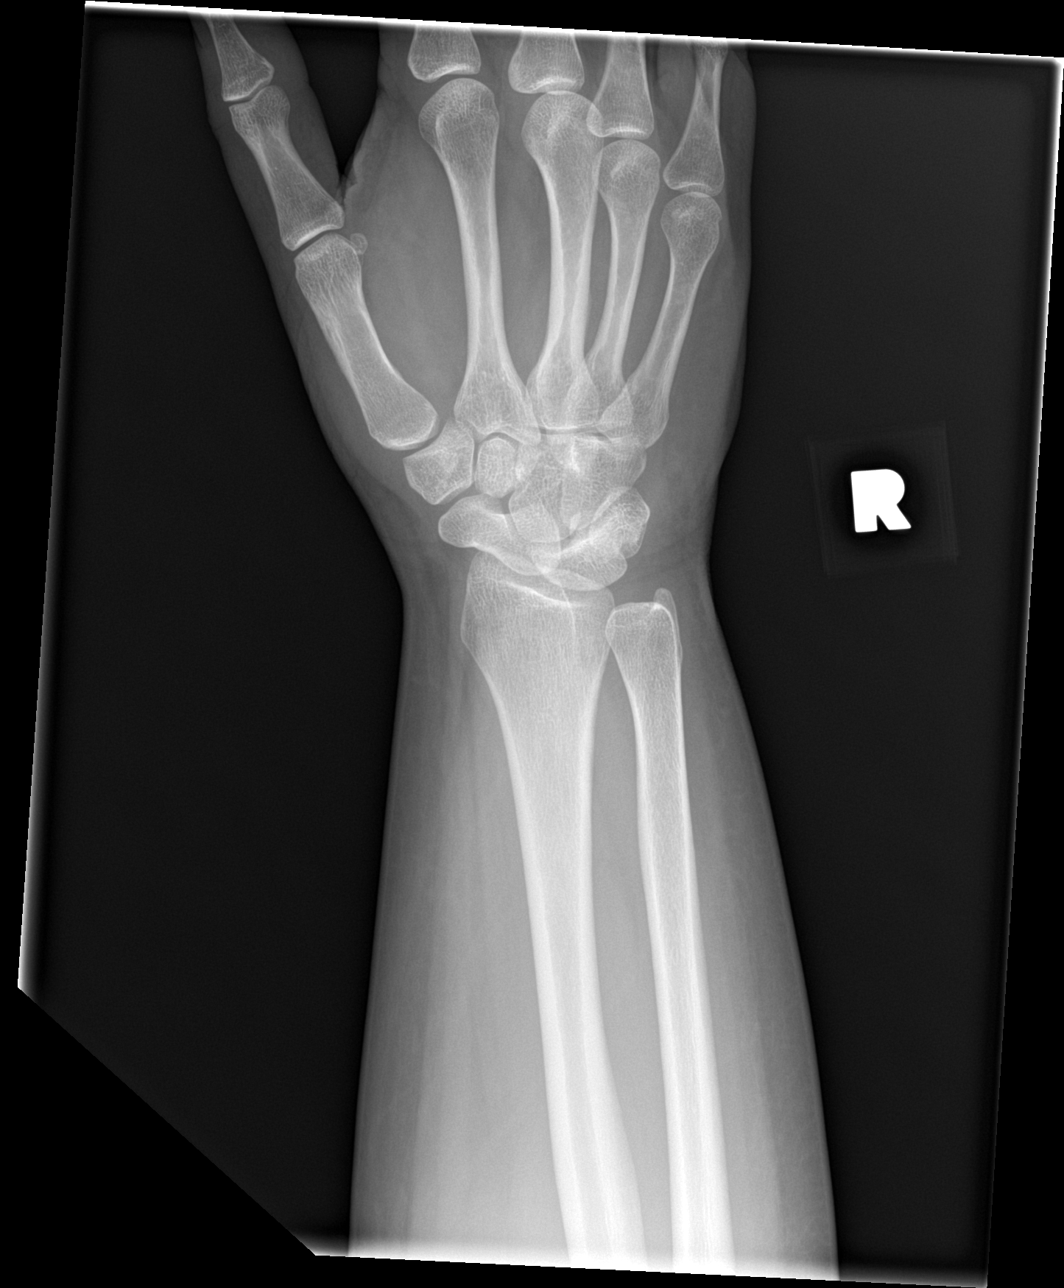

[wrist lat]
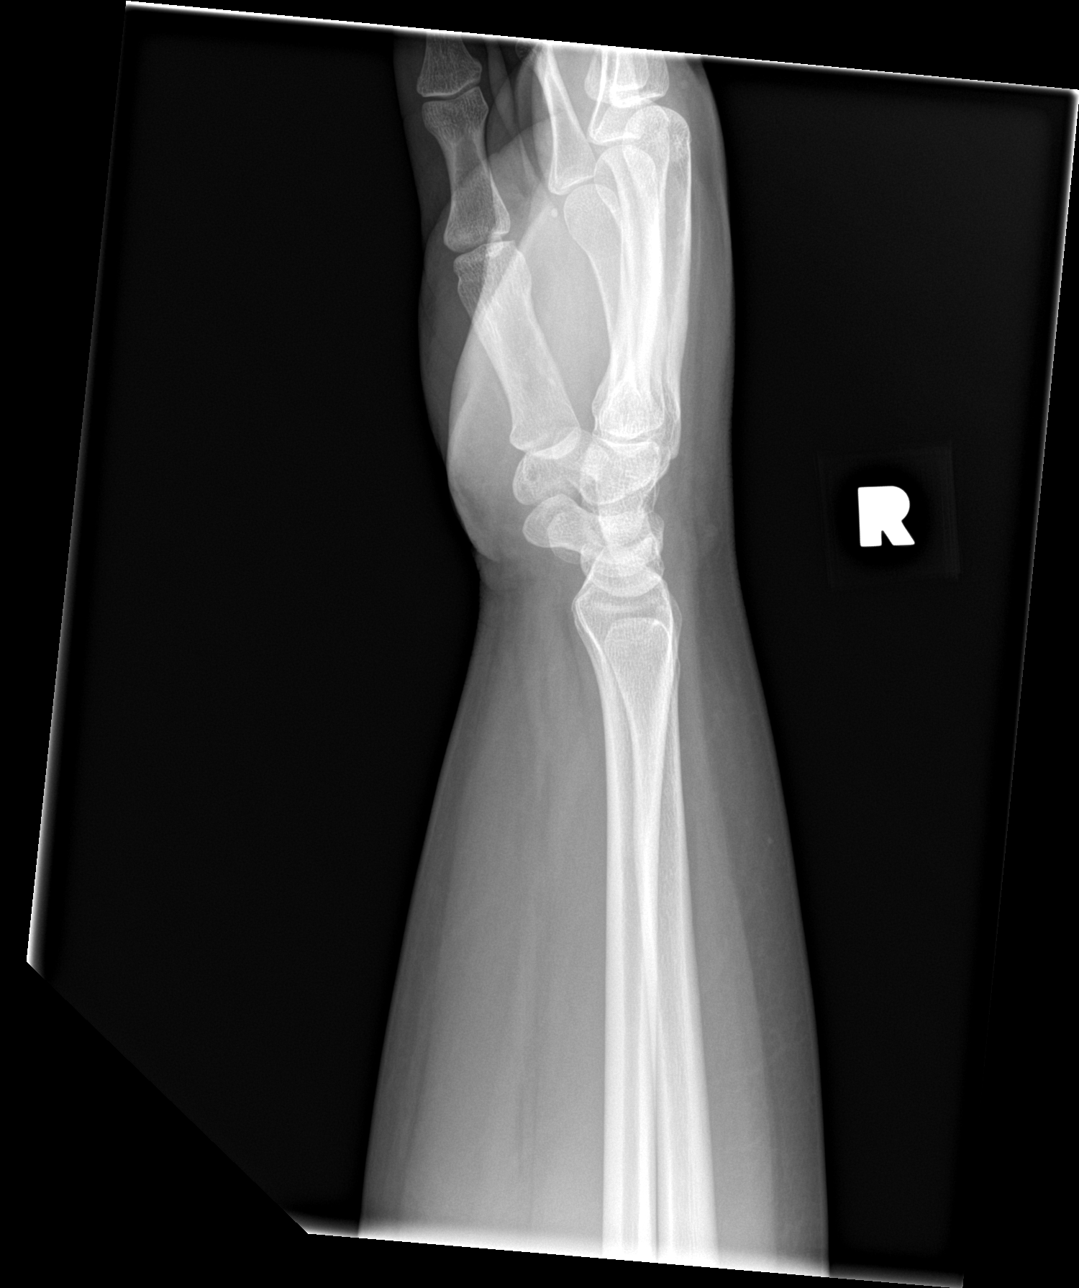

[wrist navicular]
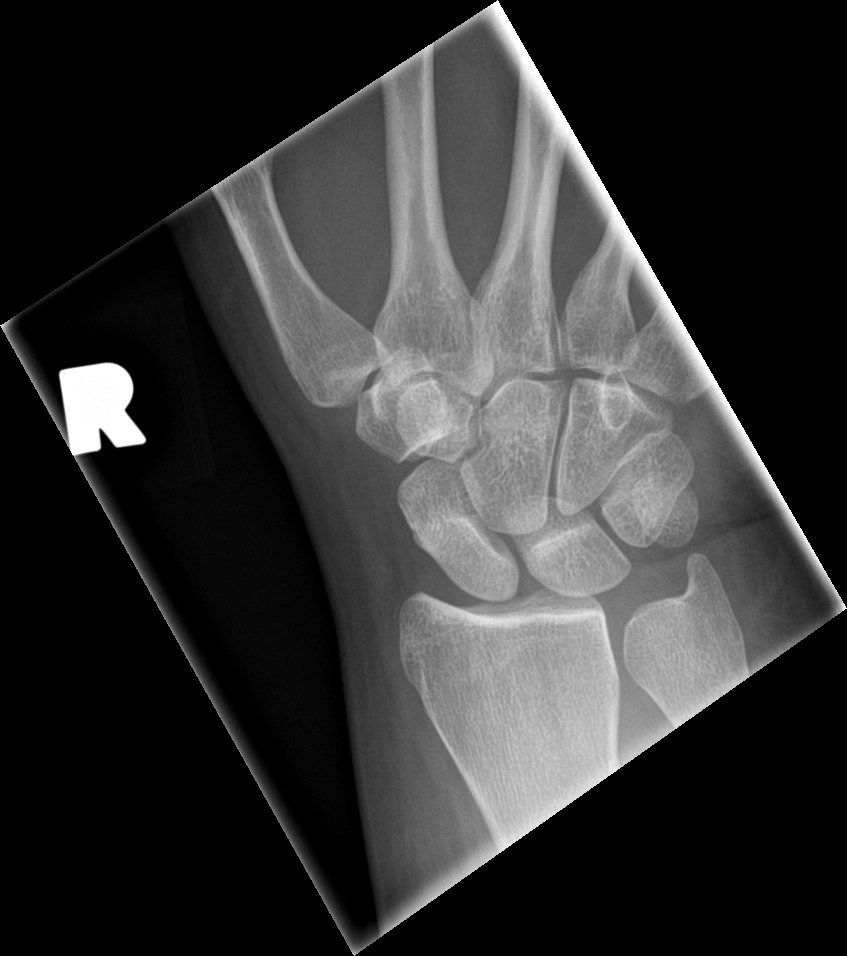

[4 of 4 positions shown; findings below may reference images not displayed]

FINDINGS: There is no evidence of fracture or dislocation. There is no
evidence of arthropathy or other focal bone abnormality. Soft
tissues are unremarkable.
IMPRESSION: Negative.

## 2022-03-30 ENCOUNTER — Emergency Department (HOSPITAL_COMMUNITY): Payer: Medicaid Other

## 2022-03-30 ENCOUNTER — Other Ambulatory Visit: Payer: Self-pay

## 2022-03-30 ENCOUNTER — Emergency Department (HOSPITAL_COMMUNITY)
Admission: EM | Admit: 2022-03-30 | Discharge: 2022-03-30 | Disposition: A | Discharge status: Home / Self Care | Payer: Medicaid Other | Attending: Emergency Medicine | Admitting: Emergency Medicine

## 2022-03-30 DIAGNOSIS — J069 Acute upper respiratory infection, unspecified: Secondary | ICD-10-CM | POA: Insufficient documentation

## 2022-03-30 DIAGNOSIS — R059 Cough, unspecified: Secondary | ICD-10-CM | POA: Insufficient documentation

## 2022-03-30 DIAGNOSIS — Z20822 Contact with and (suspected) exposure to covid-19: Secondary | ICD-10-CM | POA: Insufficient documentation

## 2022-03-30 DIAGNOSIS — R0981 Nasal congestion: Secondary | ICD-10-CM | POA: Diagnosis present

## 2022-03-30 LAB — SARS CORONAVIRUS 2 BY RT PCR: SARS Coronavirus 2 by RT PCR: NEGATIVE

## 2022-03-30 MED ORDER — IBUPROFEN 600 MG PO TABS: 600.0000 mg | ORAL_TABLET | Freq: Four times a day (QID) | ORAL | 0 refills | Status: AC | PRN

## 2022-03-30 MED ORDER — GUAIFENESIN 100 MG/5ML PO LIQD: 10.0000 mL | ORAL | 0 refills | Status: AC | PRN

## 2022-03-30 MED ORDER — OXYMETAZOLINE HCL 0.05 % NA SOLN
1.0000 | Freq: Once | NASAL | Status: AC
  Administered 2022-03-30: 1 via NASAL
  Filled 2022-03-30: qty 30

## 2022-03-30 MED ORDER — GUAIFENESIN 100 MG/5ML PO LIQD
10.0000 mL | Freq: Once | ORAL | Status: AC
  Administered 2022-03-30: 10 mL via ORAL
  Filled 2022-03-30: qty 10

## 2022-03-30 MED ORDER — IBUPROFEN 800 MG PO TABS
800.0000 mg | ORAL_TABLET | Freq: Once | ORAL | Status: AC
  Administered 2022-03-30: 800 mg via ORAL
  Filled 2022-03-30: qty 1

## 2022-03-30 NOTE — Discharge Instructions (Signed)
Rest, stay hydrated, take vitamin C/oranges/lemons, use Afrin for nasal congestion every 12 hours, Motrin 600 mg every 6 hours as needed for pain, body aches, headaches, for fever.  Also use Robitussin for cough.  Stop Afrin use after 5 days due to reported history of rebound congestion after use greater than 7 days.

## 2022-03-30 NOTE — ED Triage Notes (Signed)
Pt c/o cough and congestion x 1 week. Denies Covid Exposure and fevers. Has not taken any OTC meds for symptoms.

## 2022-03-30 NOTE — ED Provider Notes (Signed)
[facility] EMERGENCY DEPARTMENT Provider Note   CSN: [id number] Arrival date & time: 03/30/22  1851     History  Chief Complaint  Patient presents with   Cough    Holly Pope is a 31 y.o. female.  Patient is a 32 year old female presenting for cough.  Patient midst to nasal congestion and cough x1 week.  Denies any fevers or chills.  Admits to COVID-19 virus exposure.  Denies any nausea, vomiting, abdominal pain, or diarrhea.  States she has not yet taken any home medications to help improve her symptoms.  The history is provided by the patient. No language interpreter was used.  Cough Associated symptoms: no chest pain, no chills, no ear pain, no fever, no rash, no shortness of breath and no sore throat        Home Medications Prior to Admission medications   Medication Sig Start Date End Date Taking? Authorizing Provider  guaiFENesin (ROBITUSSIN) 100 MG/5ML liquid Take 10 mLs by mouth every 4 (four) hours as needed for cough or to loosen phlegm. 03/30/22  Yes Edwin Dada P, DO  ibuprofen (ADVIL) 600 MG tablet Take 1 tablet (600 mg total) by mouth every 6 (six) hours as needed for mild pain, headache or fever. 03/30/22  Yes Edwin Dada P, DO  benazepril (LOTENSIN) 20 MG tablet Take 20 mg by mouth daily.     [provider]  empagliflozin (JARDIANCE) 10 MG TABS tablet Take 10 mg by mouth every morning.    [provider]  hydrochlorothiazide (HYDRODIURIL) 25 MG tablet Take 25 mg by mouth daily.    [provider]  LANTUS SOLOSTAR 100 UNIT/ML Solostar Pen INJECT 20 UNITS SUBCUTANEOUSLY AT BEDTIME 01/04/18   [provider]  metFORMIN (GLUCOPHAGE) 1000 MG tablet Take 1 tablet (1,000 mg total) by mouth 2 (two) times daily with a meal. 03/17/16   Shon Hale, MD  naproxen (NAPROSYN) 500 MG tablet Take 1 tablet (500 mg total) by mouth 2 (two) times daily. 10/06/18   Burgess Amor, PA-C      Allergies    Tessalon [benzonatate]     Review of Systems   Review of Systems  Constitutional:  Negative for chills and fever.  HENT:  Positive for congestion. Negative for ear pain and sore throat.   Eyes:  Negative for pain and visual disturbance.  Respiratory:  Positive for cough. Negative for shortness of breath.   Cardiovascular:  Negative for chest pain and palpitations.  Gastrointestinal:  Negative for abdominal pain and vomiting.  Genitourinary:  Negative for dysuria and hematuria.  Musculoskeletal:  Negative for arthralgias and back pain.  Skin:  Negative for color change and rash.  Neurological:  Negative for seizures and syncope.  All other systems reviewed and are negative.   Physical Exam Updated Vital Signs BP (!) 128/92 (BP Location: Right Arm)   Pulse (!) 114   Temp 98.2 F (36.8 C)   Resp 18   Ht 5\' 2"  (1.575 m)   Wt 112.5 kg   SpO2 99%   BMI 45.36 kg/m  Physical Exam Vitals and nursing note reviewed.  Constitutional:      General: She is not in acute distress.    Appearance: She is well-developed.  HENT:     Head: Normocephalic and atraumatic.  Eyes:     Conjunctiva/sclera: Conjunctivae normal.  Cardiovascular:     Rate and Rhythm: Normal rate and regular rhythm.     Heart sounds: No murmur heard. Pulmonary:  Effort: Pulmonary effort is normal. No respiratory distress.     Breath sounds: Normal breath sounds.  Abdominal:     Palpations: Abdomen is soft.     Tenderness: There is no abdominal tenderness.  Musculoskeletal:        General: No swelling.     Cervical back: Neck supple.  Skin:    General: Skin is warm and dry.     Capillary Refill: Capillary refill takes less than 2 seconds.  Neurological:     Mental Status: She is alert.  Psychiatric:        Mood and Affect: Mood normal.     ED Results / Procedures / Treatments   Labs (all labs ordered are listed, but only abnormal results are displayed) Labs Reviewed  SARS CORONAVIRUS 2 BY RT PCR     EKG None  Radiology DG Chest Portable 1 View  Result Date: 03/30/2022 CLINICAL DATA:  Cough EXAM: PORTABLE CHEST 1 VIEW COMPARISON:  None Available. FINDINGS: The heart size and mediastinal contours are within normal limits. Both lungs are clear. The visualized skeletal structures are unremarkable. IMPRESSION: No active disease. Electronically Signed   By: Helyn Numbers M.D.   On: 03/30/2022 22:19    Procedures Procedures    Medications Ordered in ED Medications  oxymetazoline (AFRIN) 0.05 % nasal spray 1 spray (1 spray Each Nare Given 03/30/22 2240)  guaiFENesin (ROBITUSSIN) 100 MG/5ML liquid 10 mL (10 mLs Oral Given 03/30/22 2239)  ibuprofen (ADVIL) tablet 800 mg (800 mg Oral Given 03/30/22 2239)    ED Course/ Medical Decision Making/ A&P                           Medical Decision Making Amount and/or Complexity of Data Reviewed Radiology: ordered.  Risk OTC drugs. Prescription drug management.   10:56 PM Patient is a 31 year old female presenting for cough.  Patient is alert and oriented x3, no acute distress, afebrile, stable vital signs.  Physical exam demonstrates equal bilateral breath sounds with no adventitious lung sounds.  No hypoxia.  No respiratory distress.  Chest x-ray demonstrates no pneumonia.  Patient has nasal congestion on exam.  Given Afrin, Robitussin for cough, and Motrin.  Patient symptoms likely viral URI.  Recommended for close follow-up with PCP if the symptoms do not improve within the next 3 to 5 days.  Scription sent to pharmacy for symptomatic management.  No signs or symptoms of sepsis.  No signs or symptoms of meningitis.  Patient in no distress and overall condition improved here in the ED. Detailed discussions were had with the patient regarding current findings, and need for close f/u with PCP or on call doctor. The patient has been instructed to return immediately if the symptoms worsen in any way for re-evaluation. Patient verbalized  understanding and is in agreement with current care plan. All questions answered prior to discharge.         Final Clinical Impression(s) / ED Diagnoses Final diagnoses:  Viral URI with cough    Rx / DC Orders ED Discharge Orders          Ordered    ibuprofen (ADVIL) 600 MG tablet  Every 6 hours PRN        03/30/22 2255    guaiFENesin (ROBITUSSIN) 100 MG/5ML liquid  Every 4 hours PRN        03/30/22 2255              Edwin Dada  P, DO 03/30/22 2256

## 2022-12-08 ENCOUNTER — Telehealth: Payer: Self-pay

## 2022-12-08 NOTE — Telephone Encounter (Signed)
Called patient concerning a referral from Dr Bartholomew Crews office. Left voice mail to call office to get scheduled.
